# Patient Record
Sex: Male | Born: 1945 | Race: White | Hispanic: No | Marital: Married | Smoking: Former smoker
Health system: Southern US, Community
[De-identification: ages and names within clinical notes are randomized; demographics above are authoritative.]

## PROBLEM LIST (undated history)

## (undated) DIAGNOSIS — I219 Acute myocardial infarction, unspecified: Secondary | ICD-10-CM

## (undated) DIAGNOSIS — I272 Pulmonary hypertension, unspecified: Secondary | ICD-10-CM

## (undated) DIAGNOSIS — I10 Essential (primary) hypertension: Secondary | ICD-10-CM

## (undated) DIAGNOSIS — M1990 Unspecified osteoarthritis, unspecified site: Secondary | ICD-10-CM

## (undated) DIAGNOSIS — R251 Tremor, unspecified: Secondary | ICD-10-CM

## (undated) DIAGNOSIS — I480 Paroxysmal atrial fibrillation: Secondary | ICD-10-CM

## (undated) DIAGNOSIS — D75839 Thrombocytosis, unspecified: Secondary | ICD-10-CM

## (undated) DIAGNOSIS — E785 Hyperlipidemia, unspecified: Secondary | ICD-10-CM

## (undated) DIAGNOSIS — I071 Rheumatic tricuspid insufficiency: Secondary | ICD-10-CM

## (undated) DIAGNOSIS — I519 Heart disease, unspecified: Secondary | ICD-10-CM

## (undated) DIAGNOSIS — D509 Iron deficiency anemia, unspecified: Secondary | ICD-10-CM

## (undated) DIAGNOSIS — I340 Nonrheumatic mitral (valve) insufficiency: Secondary | ICD-10-CM

## (undated) DIAGNOSIS — I2510 Atherosclerotic heart disease of native coronary artery without angina pectoris: Secondary | ICD-10-CM

## (undated) DIAGNOSIS — K219 Gastro-esophageal reflux disease without esophagitis: Secondary | ICD-10-CM

## (undated) DIAGNOSIS — D473 Essential (hemorrhagic) thrombocythemia: Secondary | ICD-10-CM

## (undated) DIAGNOSIS — I509 Heart failure, unspecified: Secondary | ICD-10-CM

## (undated) DIAGNOSIS — E119 Type 2 diabetes mellitus without complications: Secondary | ICD-10-CM

## (undated) HISTORY — DX: Nonrheumatic mitral (valve) insufficiency: I34.0

## (undated) HISTORY — PX: CATARACT EXTRACTION: SUR2

## (undated) HISTORY — DX: Tremor, unspecified: R25.1

## (undated) HISTORY — DX: Pulmonary hypertension, unspecified: I27.20

## (undated) HISTORY — DX: Rheumatic tricuspid insufficiency: I07.1

## (undated) HISTORY — DX: Essential (hemorrhagic) thrombocythemia: D47.3

## (undated) HISTORY — DX: Thrombocytosis, unspecified: D75.839

## (undated) HISTORY — PX: KNEE SURGERY: SHX244

## (undated) HISTORY — DX: Atherosclerotic heart disease of native coronary artery without angina pectoris: I25.10

## (undated) HISTORY — DX: Type 2 diabetes mellitus without complications: E11.9

## (undated) HISTORY — DX: Paroxysmal atrial fibrillation: I48.0

## (undated) HISTORY — PX: HERNIA REPAIR: SHX51

## (undated) HISTORY — DX: Gastro-esophageal reflux disease without esophagitis: K21.9

## (undated) HISTORY — PX: HEMORRHOIDECTOMY WITH HEMORRHOID BANDING: SHX5633

## (undated) HISTORY — DX: Hyperlipidemia, unspecified: E78.5

## (undated) HISTORY — DX: Unspecified osteoarthritis, unspecified site: M19.90

## (undated) HISTORY — DX: Heart disease, unspecified: I51.9

## (undated) HISTORY — DX: Iron deficiency anemia, unspecified: D50.9

## (undated) HISTORY — DX: Acute myocardial infarction, unspecified: I21.9

## (undated) HISTORY — DX: Heart failure, unspecified: I50.9

---

## 1964-10-28 HISTORY — PX: OTHER SURGICAL HISTORY: SHX169

## 1987-10-29 DIAGNOSIS — I219 Acute myocardial infarction, unspecified: Secondary | ICD-10-CM

## 1987-10-29 HISTORY — DX: Acute myocardial infarction, unspecified: I21.9

## 1987-10-29 HISTORY — PX: CARDIAC CATHETERIZATION: SHX172

## 2006-09-30 ENCOUNTER — Ambulatory Visit: Payer: Self-pay | Admitting: Internal Medicine

## 2007-01-06 ENCOUNTER — Emergency Department: Payer: Self-pay | Admitting: Emergency Medicine

## 2009-04-27 ENCOUNTER — Ambulatory Visit: Payer: Self-pay | Admitting: Internal Medicine

## 2009-04-27 ENCOUNTER — Ambulatory Visit: Payer: Self-pay | Admitting: Ophthalmology

## 2009-05-03 ENCOUNTER — Ambulatory Visit: Payer: Self-pay | Admitting: Ophthalmology

## 2011-10-01 ENCOUNTER — Ambulatory Visit: Payer: Self-pay | Admitting: Surgery

## 2011-10-08 ENCOUNTER — Ambulatory Visit: Payer: Self-pay | Admitting: Surgery

## 2011-10-09 LAB — PATHOLOGY REPORT

## 2014-04-15 DIAGNOSIS — M1990 Unspecified osteoarthritis, unspecified site: Secondary | ICD-10-CM | POA: Insufficient documentation

## 2014-04-15 DIAGNOSIS — D649 Anemia, unspecified: Secondary | ICD-10-CM | POA: Insufficient documentation

## 2014-04-15 DIAGNOSIS — K219 Gastro-esophageal reflux disease without esophagitis: Secondary | ICD-10-CM | POA: Insufficient documentation

## 2014-04-15 DIAGNOSIS — Z955 Presence of coronary angioplasty implant and graft: Secondary | ICD-10-CM | POA: Insufficient documentation

## 2014-04-15 DIAGNOSIS — E785 Hyperlipidemia, unspecified: Secondary | ICD-10-CM | POA: Insufficient documentation

## 2014-04-15 DIAGNOSIS — I7090 Unspecified atherosclerosis: Secondary | ICD-10-CM | POA: Insufficient documentation

## 2014-04-15 DIAGNOSIS — E119 Type 2 diabetes mellitus without complications: Secondary | ICD-10-CM | POA: Insufficient documentation

## 2014-07-27 ENCOUNTER — Ambulatory Visit: Payer: Self-pay | Admitting: Internal Medicine

## 2014-07-27 LAB — CBC CANCER CENTER
Basophil #: 0 x10 3/mm (ref 0.0–0.1)
Basophil %: 0.7 %
Comment - H1-Com2: NORMAL
EOS ABS: 0.1 x10 3/mm (ref 0.0–0.7)
EOS PCT: 1.6 %
HCT: 30.3 % — ABNORMAL LOW (ref 40.0–52.0)
HGB: 9.2 g/dL — ABNORMAL LOW (ref 13.0–18.0)
LYMPHS PCT: 16.3 %
Lymphocyte #: 1.1 x10 3/mm (ref 1.0–3.6)
Lymphocytes: 26 %
MCH: 24.2 pg — AB (ref 26.0–34.0)
MCHC: 30.2 g/dL — ABNORMAL LOW (ref 32.0–36.0)
MCV: 80 fL (ref 80–100)
MONOS PCT: 10.9 %
MONOS PCT: 8 %
Monocyte #: 0.7 x10 3/mm (ref 0.2–1.0)
Neutrophil #: 4.8 x10 3/mm (ref 1.4–6.5)
Neutrophil %: 70.5 %
Platelet: 150 x10 3/mm (ref 150–440)
RBC: 3.78 10*6/uL — AB (ref 4.40–5.90)
RDW: 19.6 % — AB (ref 11.5–14.5)
SEGMENTED NEUTROPHILS: 66 %
WBC: 6.8 x10 3/mm (ref 3.8–10.6)

## 2014-07-27 LAB — IRON AND TIBC
IRON SATURATION: 5 %
Iron Bind.Cap.(Total): 527 ug/dL — ABNORMAL HIGH (ref 250–450)
Iron: 26 ug/dL — ABNORMAL LOW (ref 65–175)
Unbound Iron-Bind.Cap.: 501 ug/dL

## 2014-07-27 LAB — FERRITIN: Ferritin (ARMC): 21 ng/mL (ref 8–388)

## 2014-07-27 LAB — SEDIMENTATION RATE: ERYTHROCYTE SED RATE: 56 mm/h — AB (ref 0–20)

## 2014-07-27 LAB — FOLATE: Folic Acid: 17.2 ng/mL (ref 3.1–100.0)

## 2014-07-27 LAB — LACTATE DEHYDROGENASE: LDH: 197 U/L (ref 85–241)

## 2014-07-27 LAB — RETICULOCYTES
ABSOLUTE RETIC COUNT: 0.088 10*6/uL (ref 0.019–0.186)
RETICULOCYTE: 2.3 % (ref 0.4–3.1)

## 2014-07-28 ENCOUNTER — Ambulatory Visit: Payer: Self-pay | Admitting: Internal Medicine

## 2014-07-29 LAB — URINE IEP, RANDOM

## 2014-08-01 LAB — PROT IMMUNOELECTROPHORES(ARMC)

## 2014-08-02 LAB — OCCULT BLOOD X 1 CARD TO LAB, STOOL
Occult Blood, Feces: POSITIVE
Occult Blood, Feces: POSITIVE

## 2014-08-28 ENCOUNTER — Ambulatory Visit: Payer: Self-pay | Admitting: Internal Medicine

## 2014-09-08 DIAGNOSIS — Z789 Other specified health status: Secondary | ICD-10-CM | POA: Insufficient documentation

## 2014-09-08 DIAGNOSIS — R195 Other fecal abnormalities: Secondary | ICD-10-CM | POA: Insufficient documentation

## 2014-09-08 DIAGNOSIS — Z7289 Other problems related to lifestyle: Secondary | ICD-10-CM | POA: Insufficient documentation

## 2014-10-13 ENCOUNTER — Ambulatory Visit: Payer: Self-pay | Admitting: Unknown Physician Specialty

## 2014-10-13 HISTORY — PX: COLONOSCOPY: SHX174

## 2014-11-02 ENCOUNTER — Ambulatory Visit: Payer: Self-pay | Admitting: Internal Medicine

## 2014-11-02 LAB — CBC CANCER CENTER
Basophil #: 0.1 x10 3/mm (ref 0.0–0.1)
Basophil %: 0.8 %
EOS ABS: 0.1 x10 3/mm (ref 0.0–0.7)
Eosinophil %: 2.1 %
HCT: 34.2 % — AB (ref 40.0–52.0)
HGB: 11.2 g/dL — AB (ref 13.0–18.0)
LYMPHS ABS: 1 x10 3/mm (ref 1.0–3.6)
Lymphocyte %: 14.3 %
MCH: 29.3 pg (ref 26.0–34.0)
MCHC: 32.6 g/dL (ref 32.0–36.0)
MCV: 90 fL (ref 80–100)
MONO ABS: 0.8 x10 3/mm (ref 0.2–1.0)
Monocyte %: 11.5 %
NEUTROS ABS: 4.8 x10 3/mm (ref 1.4–6.5)
Neutrophil %: 71.3 %
Platelet: 132 x10 3/mm — ABNORMAL LOW (ref 150–440)
RBC: 3.8 10*6/uL — ABNORMAL LOW (ref 4.40–5.90)
RDW: 15.2 % — ABNORMAL HIGH (ref 11.5–14.5)
WBC: 6.8 x10 3/mm (ref 3.8–10.6)

## 2014-11-02 LAB — IRON AND TIBC
IRON: 43 ug/dL — AB (ref 65–175)
Iron Bind.Cap.(Total): 469 ug/dL — ABNORMAL HIGH (ref 250–450)
Iron Saturation: 9 %
Unbound Iron-Bind.Cap.: 426 ug/dL

## 2014-11-02 LAB — FERRITIN: FERRITIN (ARMC): 39 ng/mL (ref 8–388)

## 2014-11-28 ENCOUNTER — Ambulatory Visit: Payer: Self-pay | Admitting: Internal Medicine

## 2015-01-25 ENCOUNTER — Ambulatory Visit
Admit: 2015-01-25 | Disposition: A | Discharge status: Home / Self Care | Payer: Self-pay | Attending: Internal Medicine | Admitting: Internal Medicine

## 2015-01-25 LAB — CREATININE, SERUM: Creatinine: 0.86 mg/dL

## 2015-01-25 LAB — CBC CANCER CENTER
Basophil #: 0 x10 3/mm (ref 0.0–0.1)
Basophil %: 1 %
Eosinophil #: 0.1 x10 3/mm (ref 0.0–0.7)
Eosinophil %: 3.2 %
HCT: 33.6 % — AB (ref 40.0–52.0)
HGB: 11.5 g/dL — AB (ref 13.0–18.0)
LYMPHS PCT: 22 %
Lymphocyte #: 1 x10 3/mm (ref 1.0–3.6)
MCH: 33.2 pg (ref 26.0–34.0)
MCHC: 34.2 g/dL (ref 32.0–36.0)
MCV: 97 fL (ref 80–100)
MONOS PCT: 8.6 %
Monocyte #: 0.4 x10 3/mm (ref 0.2–1.0)
Neutrophil #: 2.8 x10 3/mm (ref 1.4–6.5)
Neutrophil %: 65.2 %
Platelet: 111 x10 3/mm — ABNORMAL LOW (ref 150–440)
RBC: 3.45 10*6/uL — ABNORMAL LOW (ref 4.40–5.90)
RDW: 13.6 % (ref 11.5–14.5)
WBC: 4.4 x10 3/mm (ref 3.8–10.6)

## 2015-01-25 LAB — IRON AND TIBC
Iron Bind.Cap.(Total): 414 (ref 250–450)
Iron Saturation: 11.1
Iron: 46 ug/dL
Unbound Iron-Bind.Cap.: 367.6

## 2015-01-25 LAB — FERRITIN: FERRITIN (ARMC): 78 ng/mL

## 2015-01-27 ENCOUNTER — Ambulatory Visit
Admit: 2015-01-27 | Disposition: A | Discharge status: Home / Self Care | Payer: Self-pay | Attending: Internal Medicine | Admitting: Internal Medicine

## 2015-02-20 LAB — SURGICAL PATHOLOGY

## 2015-04-18 ENCOUNTER — Other Ambulatory Visit: Payer: Self-pay

## 2015-04-18 DIAGNOSIS — D509 Iron deficiency anemia, unspecified: Secondary | ICD-10-CM

## 2015-04-19 ENCOUNTER — Inpatient Hospital Stay: Payer: Medicare Other

## 2015-04-19 ENCOUNTER — Inpatient Hospital Stay: Payer: Medicare Other | Attending: Internal Medicine | Admitting: Internal Medicine

## 2015-04-19 ENCOUNTER — Encounter: Payer: Self-pay | Admitting: Internal Medicine

## 2015-04-19 VITALS — BP 156/80 | HR 72 | Temp 97.1°F | Resp 18 | Ht 69.0 in | Wt 236.1 lb

## 2015-04-19 DIAGNOSIS — F17210 Nicotine dependence, cigarettes, uncomplicated: Secondary | ICD-10-CM | POA: Insufficient documentation

## 2015-04-19 DIAGNOSIS — Z7982 Long term (current) use of aspirin: Secondary | ICD-10-CM | POA: Diagnosis not present

## 2015-04-19 DIAGNOSIS — D509 Iron deficiency anemia, unspecified: Secondary | ICD-10-CM | POA: Insufficient documentation

## 2015-04-19 DIAGNOSIS — I2510 Atherosclerotic heart disease of native coronary artery without angina pectoris: Secondary | ICD-10-CM | POA: Diagnosis not present

## 2015-04-19 DIAGNOSIS — Z79899 Other long term (current) drug therapy: Secondary | ICD-10-CM | POA: Diagnosis not present

## 2015-04-19 DIAGNOSIS — E119 Type 2 diabetes mellitus without complications: Secondary | ICD-10-CM | POA: Insufficient documentation

## 2015-04-19 DIAGNOSIS — Q2730 Arteriovenous malformation, site unspecified: Secondary | ICD-10-CM

## 2015-04-19 DIAGNOSIS — R5383 Other fatigue: Secondary | ICD-10-CM | POA: Diagnosis not present

## 2015-04-19 DIAGNOSIS — D696 Thrombocytopenia, unspecified: Secondary | ICD-10-CM | POA: Diagnosis not present

## 2015-04-19 LAB — CBC WITH DIFFERENTIAL/PLATELET
Basophils Absolute: 0 10*3/uL (ref 0–0.1)
Basophils Relative: 1 %
Eosinophils Absolute: 0.1 10*3/uL (ref 0–0.7)
Eosinophils Relative: 1 %
HCT: 38.2 % — ABNORMAL LOW (ref 40.0–52.0)
HEMOGLOBIN: 12.6 g/dL — AB (ref 13.0–18.0)
Lymphocytes Relative: 13 %
Lymphs Abs: 0.7 10*3/uL — ABNORMAL LOW (ref 1.0–3.6)
MCH: 31.4 pg (ref 26.0–34.0)
MCHC: 32.9 g/dL (ref 32.0–36.0)
MCV: 95.6 fL (ref 80.0–100.0)
MONO ABS: 0.7 10*3/uL (ref 0.2–1.0)
Monocytes Relative: 11 %
Neutro Abs: 4.3 10*3/uL (ref 1.4–6.5)
Neutrophils Relative %: 74 %
Platelets: 103 10*3/uL — ABNORMAL LOW (ref 150–440)
RBC: 4 MIL/uL — ABNORMAL LOW (ref 4.40–5.90)
RDW: 13.9 % (ref 11.5–14.5)
WBC: 5.7 10*3/uL (ref 3.8–10.6)

## 2015-04-19 LAB — IRON AND TIBC
Iron: 118 ug/dL (ref 45–182)
SATURATION RATIOS: 23 % (ref 17.9–39.5)
TIBC: 505 ug/dL — AB (ref 250–450)
UIBC: 387 ug/dL

## 2015-04-19 LAB — FERRITIN: Ferritin: 53 ng/mL (ref 24–336)

## 2015-04-30 NOTE — Progress Notes (Signed)
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 Patient intolerant of oral iron. On parenteral iron therapy. Received total 500 mg IV Venofer on 08/16/2014.   Anemia workup on 07/27/2014 showed hemoglobin 9.2, platelets 150, absolute retic 0.088, ESR 56, ferritin 21, serum iron low at 26, TIBC elevated at 527, iron saturation 5%. Otherwise, folate, LDH, direct Coombs test, FANA, SIEP (polyclonal), random UIEP, B12, haptoglobin, serum EPO (201.2), all unremarkable. Stool Hemoccult was positive x2.  December 17/2015 - EGD showed 2 nonbleeding ectasias in stomach, treated. Erythematous antrum. Colonoscopy showed multiple recently bleeding ectasias. Internal hemorrhoids. Transverse colon mucus biopsy negative.    HISTORY OF PRESENT ILLNESS:  Patient returns for continued hematology followup, he was seen 6 months ago. He had 1 dose of IV Venofer 500 mg in October. He has been taking low-dose ferrous sulfate 45 mg extended release 1 tablet daily, states that he does not have much side effects from this. Hemoglobin today is better at 12.6. He has intermittent dark stools otherwise denies any obvious bright red blood in stools,  or hematuria. Overall energy level is better since getting IV iron last time but still gets fatigue on exertion. No angina, palpitation, orthopnea, or PND. No new bone pains. Platelet count today is 103K, denies other bleeding issues.     REVIEW OF SYSTEMS:   ROS As in HPI above. In addition, no fever, chills or sweats. No new headaches or focal weakness.  No new sore throat, cough, shortness of breath, sputum, hemoptysis or chest pain. No dizziness or palpitation. No abdominal pain, constipation, diarrhea,  dysuria or hematuria. No new skin rash or bleeding symptoms. No new paresthesias in extremities.    PAST MEDICAL HISTORY: Reviewed. Past Medical History  Diagnosis Date  . Diabetes mellitus without complication           Diabetes mellitus  Coronary artery disease status post stent placement  Hemorrhoidectomy  Hernia repair  PAST SURGICAL HISTORY: Reviewed. As above  FAMILY HISTORY: Reviewed. Denies malignancy or hematological disorders.  ADVANCED DIRECTIVES:  <no information>  SOCIAL HISTORY: Reviewed. History  Substance Use Topics  . Smoking status: Former Smoker -- 0.50 packs/day for 40 years    Types: Cigarettes    Quit date: 04/18/1997  . Smokeless tobacco: Never Used  . Alcohol Use: 8.4 oz/week    14 Shots of liquor per week  Denies smoking or alcohol usage.   Current Outpatient Prescriptions  Medication Sig Dispense Refill  . aspirin 81 MG tablet Take 81 mg by mouth daily.    Marland Kitchen atenolol (TENORMIN) 25 MG tablet Take 25 mg by mouth daily.    . celecoxib (CELEBREX) 200 MG capsule Take 200 mg by mouth daily.    . hydrochlorothiazide (HYDRODIURIL) 25 MG tablet Take 25 mg by mouth daily.    Marland Kitchen HYDROcodone-acetaminophen (NORCO) 10-325 MG per tablet   0  . lansoprazole (PREVACID) 15 MG capsule Take 15 mg by mouth daily at 12 noon.    . quiNINE (QUALAQUIN) 324 MG capsule Take 324 mg by mouth daily as needed.    . simvastatin (ZOCOR) 40 MG tablet Take 40 mg by mouth daily.     No current facility-administered medications for this visit.    PHYSICAL EXAM: Filed  Vitals:   04/19/15 0848  BP: 156/80  Pulse: 72  Temp: 97.1 F (36.2 C)  Resp: 18     Body mass index is 34.85 kg/(m^2).       GENERAL: Patient is alert and oriented and in no acute distress. There is no icterus or pallor. HEENT: EOMs intact. No cervical lymphadenopathy. CVS: S1S2, regular LUNGS: Bilaterally clear to auscultation, no rhonchi. ABDOMEN: Soft, nontender. No hepatosplenomegaly clinically.    EXTREMITIES: No pedal edema.   LAB RESULTS: Serum iron 118, ferritin 53, iron sat 23%, TIBC 505, Hb 12.6, WBC 5.7, plts 103.     Component Value Date/Time   CREATININE 0.86 01/25/2015 0856   GFRNONAA >60 01/25/2015 0856   GFRAA >60 01/25/2015 0856   Lab Results  Component Value Date   WBC 5.7 04/19/2015   NEUTROABS 4.3 04/19/2015   HGB 12.6* 04/19/2015   HCT 38.2* 04/19/2015   MCV 95.6 04/19/2015   PLT 103* 04/19/2015     STUDIES: 07/18/14 - Hb 8.8, MCV 82.3, WBC 6800 with unremarkable differential, platelets 151, Cr 0.9, calcium 8.9, LFT unremarkable except AST of 50, albumin 3.8. June 2015 - Hb was 9.3, WBC 6600, platelets 181. Oct 2015 - stool OB positive.  ASSESSMENT / PLAN:   1. 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 Patient intolerant of oral iron. On parenteral iron therapy  -  Reviewed labs from today and d/w patient and family present. Hb is better at 12.6, iron study has normalized except elevated TIBC of 505. He has intermittent dark stools and given AVMs and low platelets have d/w Dr.Sparks who is okay with stopping aspirin to decrease risk of bleeding. Have recommended pursuing bone marrow biopsy but he first wants to stop alcohol and see if count improves, otherwise will be agreeable to bone marrow biopsy. Will monitor closely, get CBC q 3 weekly. Next MD f/u at 24 weeks with labs and make continued treatment planning.  2. Bleeding ectasias in colon on colonoscopy Dec 2015 - plan is as above.  3. Thrombocytopenia  - New-onset but mild and no obvious bleeding symptoms. Platelet count in September was low normal range. ?ITP versus alcohol versus other etiology. Plan is as above.  4. In between visits, patient advised to call or come to ER in case of any progressive anemia symptoms or acute sickness. He is agreeable to this plan.    Leia Alf, MD   04/30/2015 8:47 PM

## 2015-05-10 ENCOUNTER — Inpatient Hospital Stay: Payer: Medicare Other | Attending: Internal Medicine

## 2015-05-10 DIAGNOSIS — D696 Thrombocytopenia, unspecified: Secondary | ICD-10-CM | POA: Insufficient documentation

## 2015-05-10 DIAGNOSIS — D509 Iron deficiency anemia, unspecified: Secondary | ICD-10-CM

## 2015-05-10 LAB — CBC WITH DIFFERENTIAL/PLATELET
BASOS ABS: 0.1 10*3/uL (ref 0–0.1)
Basophils Relative: 1 %
EOS ABS: 0.1 10*3/uL (ref 0–0.7)
Eosinophils Relative: 3 %
HCT: 39.3 % — ABNORMAL LOW (ref 40.0–52.0)
Hemoglobin: 13 g/dL (ref 13.0–18.0)
LYMPHS ABS: 1.3 10*3/uL (ref 1.0–3.6)
Lymphocytes Relative: 28 %
MCH: 32 pg (ref 26.0–34.0)
MCHC: 33 g/dL (ref 32.0–36.0)
MCV: 97 fL (ref 80.0–100.0)
MONO ABS: 0.5 10*3/uL (ref 0.2–1.0)
MONOS PCT: 11 %
NEUTROS PCT: 57 %
Neutro Abs: 2.8 10*3/uL (ref 1.4–6.5)
Platelets: 140 10*3/uL — ABNORMAL LOW (ref 150–440)
RBC: 4.05 MIL/uL — ABNORMAL LOW (ref 4.40–5.90)
RDW: 15.2 % — AB (ref 11.5–14.5)
WBC: 4.9 10*3/uL (ref 3.8–10.6)

## 2015-05-10 LAB — RETICULOCYTES
RBC.: 4.05 MIL/uL — ABNORMAL LOW (ref 4.40–5.90)
Retic Count, Absolute: 85.1 10*3/uL (ref 19.0–183.0)
Retic Ct Pct: 2.1 % (ref 0.4–3.1)

## 2015-05-10 LAB — VITAMIN B12: Vitamin B-12: 563 pg/mL (ref 180–914)

## 2015-05-12 LAB — FOLATE RBC
FOLATE, HEMOLYSATE: 388 ng/mL
Folate, RBC: 990 ng/mL (ref >498)
Hematocrit: 39.2 % (ref 37.5–51.0)

## 2015-05-31 ENCOUNTER — Inpatient Hospital Stay: Payer: Medicare Other | Attending: Internal Medicine

## 2015-05-31 DIAGNOSIS — D509 Iron deficiency anemia, unspecified: Secondary | ICD-10-CM

## 2015-05-31 DIAGNOSIS — D696 Thrombocytopenia, unspecified: Secondary | ICD-10-CM | POA: Diagnosis present

## 2015-05-31 LAB — CBC WITH DIFFERENTIAL/PLATELET
BASOS ABS: 0.1 10*3/uL (ref 0–0.1)
BASOS PCT: 1 %
EOS ABS: 0.1 10*3/uL (ref 0–0.7)
EOS PCT: 2 %
HEMATOCRIT: 39.3 % — AB (ref 40.0–52.0)
Hemoglobin: 13.3 g/dL (ref 13.0–18.0)
Lymphocytes Relative: 18 %
Lymphs Abs: 1.2 10*3/uL (ref 1.0–3.6)
MCH: 32.4 pg (ref 26.0–34.0)
MCHC: 33.8 g/dL (ref 32.0–36.0)
MCV: 96.1 fL (ref 80.0–100.0)
Monocytes Absolute: 0.8 10*3/uL (ref 0.2–1.0)
Monocytes Relative: 11 %
NEUTROS PCT: 68 %
Neutro Abs: 4.5 10*3/uL (ref 1.4–6.5)
Platelets: 112 10*3/uL — ABNORMAL LOW (ref 150–440)
RBC: 4.09 MIL/uL — AB (ref 4.40–5.90)
RDW: 16 % — ABNORMAL HIGH (ref 11.5–14.5)
WBC: 6.7 10*3/uL (ref 3.8–10.6)

## 2015-06-05 ENCOUNTER — Telehealth: Payer: Self-pay | Admitting: *Deleted

## 2015-06-05 NOTE — Telephone Encounter (Signed)
Pt called for results and let him know that hgb normal and plt 112 which is less than it was last time.  Asked pt if he is off the aspirin and he said that dr pandit was going to talk with [facility] and they would call him but he never got the call.  Per pandit note it states he discussed with [facility] over the phone and he was ok to stop asa.  Pt will stop taking today and when he comes 8/24 we can see if there is a difference. Pt agreeable to plan

## 2015-06-21 ENCOUNTER — Inpatient Hospital Stay: Payer: Medicare Other

## 2015-07-12 ENCOUNTER — Inpatient Hospital Stay: Payer: Medicare Other | Attending: Internal Medicine

## 2015-07-21 ENCOUNTER — Other Ambulatory Visit: Payer: Self-pay | Admitting: Internal Medicine

## 2015-07-21 DIAGNOSIS — R270 Ataxia, unspecified: Secondary | ICD-10-CM

## 2015-07-26 ENCOUNTER — Ambulatory Visit
Admission: RE | Admit: 2015-07-26 | Discharge: 2015-07-26 | Disposition: A | Discharge status: Home / Self Care | Payer: Medicare Other | Source: Ambulatory Visit | Attending: Internal Medicine | Admitting: Internal Medicine

## 2015-07-26 DIAGNOSIS — R270 Ataxia, unspecified: Secondary | ICD-10-CM | POA: Diagnosis present

## 2015-07-26 DIAGNOSIS — I2510 Atherosclerotic heart disease of native coronary artery without angina pectoris: Secondary | ICD-10-CM | POA: Diagnosis not present

## 2015-08-02 ENCOUNTER — Inpatient Hospital Stay: Payer: Medicare Other | Attending: Internal Medicine

## 2015-08-23 ENCOUNTER — Inpatient Hospital Stay: Payer: Medicare Other

## 2015-09-13 ENCOUNTER — Inpatient Hospital Stay: Payer: Medicare Other | Attending: Internal Medicine

## 2015-10-02 ENCOUNTER — Other Ambulatory Visit: Payer: Self-pay | Admitting: *Deleted

## 2015-10-02 DIAGNOSIS — D509 Iron deficiency anemia, unspecified: Secondary | ICD-10-CM

## 2015-10-02 DIAGNOSIS — D696 Thrombocytopenia, unspecified: Secondary | ICD-10-CM

## 2015-10-04 ENCOUNTER — Inpatient Hospital Stay: Payer: Medicare Other | Attending: Internal Medicine

## 2015-10-04 ENCOUNTER — Inpatient Hospital Stay (HOSPITAL_BASED_OUTPATIENT_CLINIC_OR_DEPARTMENT_OTHER): Payer: Medicare Other | Admitting: Internal Medicine

## 2015-10-04 ENCOUNTER — Encounter: Payer: Self-pay | Admitting: Internal Medicine

## 2015-10-04 VITALS — BP 155/77 | HR 70 | Temp 98.8°F | Resp 18 | Ht 69.0 in | Wt 229.3 lb

## 2015-10-04 DIAGNOSIS — D7589 Other specified diseases of blood and blood-forming organs: Secondary | ICD-10-CM | POA: Insufficient documentation

## 2015-10-04 DIAGNOSIS — Z87891 Personal history of nicotine dependence: Secondary | ICD-10-CM | POA: Insufficient documentation

## 2015-10-04 DIAGNOSIS — Z79899 Other long term (current) drug therapy: Secondary | ICD-10-CM | POA: Insufficient documentation

## 2015-10-04 DIAGNOSIS — I2510 Atherosclerotic heart disease of native coronary artery without angina pectoris: Secondary | ICD-10-CM | POA: Insufficient documentation

## 2015-10-04 DIAGNOSIS — E119 Type 2 diabetes mellitus without complications: Secondary | ICD-10-CM

## 2015-10-04 DIAGNOSIS — R251 Tremor, unspecified: Secondary | ICD-10-CM

## 2015-10-04 DIAGNOSIS — Z993 Dependence on wheelchair: Secondary | ICD-10-CM | POA: Insufficient documentation

## 2015-10-04 DIAGNOSIS — I272 Other secondary pulmonary hypertension: Secondary | ICD-10-CM | POA: Insufficient documentation

## 2015-10-04 DIAGNOSIS — E785 Hyperlipidemia, unspecified: Secondary | ICD-10-CM | POA: Diagnosis not present

## 2015-10-04 DIAGNOSIS — Z8719 Personal history of other diseases of the digestive system: Secondary | ICD-10-CM | POA: Diagnosis not present

## 2015-10-04 DIAGNOSIS — D509 Iron deficiency anemia, unspecified: Secondary | ICD-10-CM

## 2015-10-04 DIAGNOSIS — F1010 Alcohol abuse, uncomplicated: Secondary | ICD-10-CM | POA: Diagnosis not present

## 2015-10-04 DIAGNOSIS — Z9181 History of falling: Secondary | ICD-10-CM | POA: Diagnosis not present

## 2015-10-04 DIAGNOSIS — I081 Rheumatic disorders of both mitral and tricuspid valves: Secondary | ICD-10-CM

## 2015-10-04 DIAGNOSIS — I252 Old myocardial infarction: Secondary | ICD-10-CM | POA: Diagnosis not present

## 2015-10-04 DIAGNOSIS — D696 Thrombocytopenia, unspecified: Secondary | ICD-10-CM | POA: Diagnosis not present

## 2015-10-04 DIAGNOSIS — K219 Gastro-esophageal reflux disease without esophagitis: Secondary | ICD-10-CM

## 2015-10-04 DIAGNOSIS — D473 Essential (hemorrhagic) thrombocythemia: Secondary | ICD-10-CM

## 2015-10-04 LAB — CBC WITH DIFFERENTIAL/PLATELET
BASOS PCT: 1 %
Basophils Absolute: 0 10*3/uL (ref 0–0.1)
Eosinophils Absolute: 0 10*3/uL (ref 0–0.7)
Eosinophils Relative: 1 %
HEMATOCRIT: 43.9 % (ref 40.0–52.0)
HEMOGLOBIN: 15.3 g/dL (ref 13.0–18.0)
Lymphocytes Relative: 11 %
Lymphs Abs: 0.6 10*3/uL — ABNORMAL LOW (ref 1.0–3.6)
MCH: 35.9 pg — ABNORMAL HIGH (ref 26.0–34.0)
MCHC: 34.7 g/dL (ref 32.0–36.0)
MCV: 103.5 fL — ABNORMAL HIGH (ref 80.0–100.0)
Monocytes Absolute: 0.7 10*3/uL (ref 0.2–1.0)
Monocytes Relative: 13 %
NEUTROS PCT: 74 %
Neutro Abs: 4.1 10*3/uL (ref 1.4–6.5)
Platelets: 83 10*3/uL — ABNORMAL LOW (ref 150–440)
RBC: 4.25 MIL/uL — AB (ref 4.40–5.90)
RDW: 12.9 % (ref 11.5–14.5)
WBC: 5.5 10*3/uL (ref 3.8–10.6)

## 2015-10-04 LAB — IRON AND TIBC
IRON: 173 ug/dL (ref 45–182)
SATURATION RATIOS: 45 % — AB (ref 17.9–39.5)
TIBC: 385 ug/dL (ref 250–450)
UIBC: 212 ug/dL

## 2015-10-04 LAB — FERRITIN: FERRITIN: 128 ng/mL (ref 24–336)

## 2015-10-04 NOTE — Progress Notes (Signed)
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 alcohol  INTERVAL HISTORY:  This is my first interaction with the patient since I joined the practice September 2016. I reviewed the patient's prior charts/pertinent labs/imaging in detail; findings are summarized above.   A pleasant 69 year old Caucasian male patient with above history of iron deficiency anemia from previous GI bleed; is here for follow-up.  Patient states that he has had difficulty with balance for the last many months. He has been falling; and is currently in a wheelchair.   In general his appetite is good. He is not losing any significant weight. Denies any chest pain or shortness of breath or cough. He admits to drinking alcohol every night; for the last 6 months or so. He denies any blood in stools black stools.   REVIEW OF SYSTEMS:  A complete 10 point review of system is done which is negative except mentioned above/history of present illness.   PAST MEDICAL HISTORY :  Past Medical History  Diagnosis Date  . Diabetes mellitus without complication ([place])   . Thrombocytosis ([place])   . IDA (iron deficiency anemia)   . Heart disease   . Heart attack ([place]) 1989  . Tremor   . Chronic pulmonary hypertension ([place])   . Moderate tricuspid insufficiency   . Moderate mitral insufficiency   . Paroxysmal a-fib ([place])   . OA (osteoarthritis)   . ASCVD (arteriosclerotic cardiovascular disease)   . Hyperlipidemia   . GERD (gastroesophageal reflux disease)     PAST SURGICAL HISTORY :   Past Surgical History  Procedure Laterality Date  . Knee surgery      bilateral knee surgery  . Hernia repair      x 2  . Cataract extraction    . Cardiac  catheterization  1989    with stainless steel stent  . Colonoscopy  10/13/2014  . Closed reduction vertebral process fracture  1966  . Hemorrhoidectomy with hemorrhoid banding      FAMILY HISTORY :   Family History  Problem Relation Age of Onset  . Heart attack Father     SOCIAL HISTORY:   Social History  Substance Use Topics  . Smoking status: Former Smoker -- 0.50 packs/day for 40 years    Types: Cigarettes    Quit date: 04/18/1997  . Smokeless tobacco: Never Used  . Alcohol Use: 30.0 oz/week    50 Shots of liquor per week     Comment: "I mix a couple cups whisky/day"    ALLERGIES:  has no allergies on file.  MEDICATIONS:  Current Outpatient Prescriptions  Medication Sig Dispense Refill  . atenolol (TENORMIN) 25 MG tablet Take 25 mg by mouth 2 (two) times daily.     . celecoxib (CELEBREX) 200 MG capsule Take 200 mg by mouth daily. As needed    . hydrochlorothiazide (HYDRODIURIL) 25 MG tablet Take 25 mg by mouth daily.     Marland Kitchen HYDROcodone-acetaminophen (NORCO) 10-325 MG per tablet 2 tablets every 6 (six) hours as needed for moderate pain.   0  . lansoprazole (PREVACID) 15 MG capsule Take 15 mg by mouth daily at 12 noon.    . simvastatin (ZOCOR) 40 MG tablet Take 40 mg by  mouth daily.    . quiNINE (QUALAQUIN) 324 MG capsule Take 324 mg by mouth daily as needed.     No current facility-administered medications for this visit.    PHYSICAL EXAMINATION:  BP 155/77 mmHg  Pulse 70  Temp(Src) 98.8 F (37.1 C) (Tympanic)  Resp 18  Ht '5\' 9"'  (1.753 m)  Wt 229 lb 4.5 oz (104 kg)  BMI 33.84 kg/m2  Filed Weights   10/04/15 1041  Weight: 229 lb 4.5 oz (104 kg)    GENERAL: Well-nourished well-developed; Alert, no distress and comfortable.   He is in a wheelchair. Accompanied by his wife. EYES: no pallor or icterus OROPHARYNX: no thrush or ulceration; good dentition  NECK: supple, no masses felt LYMPH:  no palpable lymphadenopathy in the cervical, axillary or inguinal  regions LUNGS: clear to auscultation and  No wheeze or crackles HEART/CVS: regular rate & rhythm and no murmurs; No lower extremity edema ABDOMEN:abdomen soft, non-tender and normal bowel sounds Musculoskeletal:no cyanosis of digits and no clubbing  PSYCH: alert & oriented x 3 with fluent speech NEURO: no focal motor/sensory deficits; he has mild tremors in his hands.  SKIN:  no rashes or significant lesions  LABORATORY DATA:  I have reviewed the data as listed    Component Value Date/Time   CREATININE 0.86 01/25/2015 0856   GFRNONAA >60 01/25/2015 0856   GFRAA >60 01/25/2015 0856    No results found for: SPEP, UPEP  Lab Results  Component Value Date   WBC 5.5 10/04/2015   NEUTROABS 4.1 10/04/2015   HGB 15.3 10/04/2015   HCT 43.9 10/04/2015   MCV 103.5* 10/04/2015   PLT 83* 10/04/2015      Chemistry      Component Value Date/Time   CREATININE 0.86 01/25/2015 0856   No results found for: CALCIUM, ALKPHOS, AST, ALT, BILITOT     RADIOGRAPHIC STUDIES: I have personally reviewed the radiological images as listed and agreed with the findings in the report. No results found.   ASSESSMENT & PLAN:   # Iron deficiency anemia- likely from history of GI bleed/AVMs status post IV iron infusion in October 2015. Today hemoglobin is 15.3. No iron infusion needed.  # Thrombocytopenia- initially noted in January 2016 ~130s; however slowly has been getting worse- and today the platelet count is 83. Patient also has mild macrocytosis with MCV 103. Likely secondary to alcohol. Question related to MDS versus other causes. If thrombocytopenia continues to get worse- discussed the possible need for bone marrow biopsy.   # Alcohol abuse- Discussed the importance of alcohol cessation. He is not very keen on cessation.   No orders of the defined types were placed in this encounter.   All questions were answered. The patient knows to call the clinic with any problems, questions or concerns.  No barriers to learning was detected.  # 25 minutes face-to-face with the patient discussing the above plan of care; more than 50% of time spent on prognosis/ natural history; counseling and coordination.      Cammie Sickle, MD 10/04/2015 10:54 AM

## 2016-02-02 ENCOUNTER — Inpatient Hospital Stay: Payer: Medicare Other | Admitting: Internal Medicine

## 2016-02-02 ENCOUNTER — Inpatient Hospital Stay: Payer: Medicare Other | Attending: Internal Medicine

## 2018-01-21 ENCOUNTER — Telehealth: Payer: Self-pay | Admitting: Internal Medicine

## 2018-01-21 NOTE — Telephone Encounter (Signed)
Dx Pancytopenia. Ref by Dr Dellis Filbert D.Sparks.  Notes in Book. New/Updated patient pkt and appt ltr was mailed to patient.  Referring office/[facility] notified for 04/12 @ 2:30 pm, (time/date)  per Heather. ESTPT 15 mins/ok per Dr Rogue Bussing.

## 2018-02-06 ENCOUNTER — Inpatient Hospital Stay: Payer: Medicare Other | Attending: Internal Medicine | Admitting: Internal Medicine

## 2018-02-06 ENCOUNTER — Inpatient Hospital Stay: Payer: Medicare Other

## 2018-02-06 ENCOUNTER — Encounter: Payer: Self-pay | Admitting: Internal Medicine

## 2018-02-06 VITALS — BP 153/89 | HR 86 | Temp 97.6°F | Resp 20 | Ht 69.0 in | Wt 235.0 lb

## 2018-02-06 DIAGNOSIS — Z87891 Personal history of nicotine dependence: Secondary | ICD-10-CM | POA: Diagnosis not present

## 2018-02-06 DIAGNOSIS — D696 Thrombocytopenia, unspecified: Secondary | ICD-10-CM

## 2018-02-06 DIAGNOSIS — M25511 Pain in right shoulder: Secondary | ICD-10-CM | POA: Diagnosis not present

## 2018-02-06 DIAGNOSIS — D731 Hypersplenism: Secondary | ICD-10-CM | POA: Diagnosis not present

## 2018-02-06 DIAGNOSIS — F1010 Alcohol abuse, uncomplicated: Secondary | ICD-10-CM

## 2018-02-06 DIAGNOSIS — Z79899 Other long term (current) drug therapy: Secondary | ICD-10-CM | POA: Diagnosis not present

## 2018-02-06 DIAGNOSIS — R161 Splenomegaly, not elsewhere classified: Secondary | ICD-10-CM

## 2018-02-06 DIAGNOSIS — D509 Iron deficiency anemia, unspecified: Secondary | ICD-10-CM

## 2018-02-06 DIAGNOSIS — D7589 Other specified diseases of blood and blood-forming organs: Secondary | ICD-10-CM

## 2018-02-06 DIAGNOSIS — E119 Type 2 diabetes mellitus without complications: Secondary | ICD-10-CM

## 2018-02-06 LAB — COMPREHENSIVE METABOLIC PANEL
ALBUMIN: 3.3 g/dL — AB (ref 3.5–5.0)
ALK PHOS: 115 U/L (ref 38–126)
ALT: 51 U/L (ref 17–63)
ANION GAP: 17 — AB (ref 5–15)
AST: 283 U/L — ABNORMAL HIGH (ref 15–41)
BILIRUBIN TOTAL: 5.8 mg/dL — AB (ref 0.3–1.2)
BUN: 10 mg/dL (ref 6–20)
CALCIUM: 8.7 mg/dL — AB (ref 8.9–10.3)
CO2: 26 mmol/L (ref 22–32)
Chloride: 92 mmol/L — ABNORMAL LOW (ref 101–111)
Creatinine, Ser: 0.85 mg/dL (ref 0.61–1.24)
GFR calc Af Amer: >60 mL/min (ref >60)
GLUCOSE: 109 mg/dL — AB (ref 65–99)
Potassium: 3 mmol/L — ABNORMAL LOW (ref 3.5–5.1)
Sodium: 135 mmol/L (ref 135–145)
TOTAL PROTEIN: 7.9 g/dL (ref 6.5–8.1)

## 2018-02-06 LAB — CBC WITH DIFFERENTIAL/PLATELET
BASOS PCT: 1 %
Basophils Absolute: 0 10*3/uL (ref 0–0.1)
EOS ABS: 0.1 10*3/uL (ref 0–0.7)
Eosinophils Relative: 2 %
HCT: 39 % — ABNORMAL LOW (ref 40.0–52.0)
HEMOGLOBIN: 13.7 g/dL (ref 13.0–18.0)
Lymphocytes Relative: 17 %
Lymphs Abs: 0.7 10*3/uL — ABNORMAL LOW (ref 1.0–3.6)
MCH: 37.9 pg — ABNORMAL HIGH (ref 26.0–34.0)
MCHC: 35.2 g/dL (ref 32.0–36.0)
MCV: 107.7 fL — ABNORMAL HIGH (ref 80.0–100.0)
MONOS PCT: 14 %
Monocytes Absolute: 0.6 10*3/uL (ref 0.2–1.0)
NEUTROS PCT: 66 %
Neutro Abs: 2.7 10*3/uL (ref 1.4–6.5)
Platelets: 40 10*3/uL — ABNORMAL LOW (ref 150–440)
RBC: 3.63 MIL/uL — ABNORMAL LOW (ref 4.40–5.90)
RDW: 15.1 % — AB (ref 11.5–14.5)
WBC: 4 10*3/uL (ref 3.8–10.6)

## 2018-02-06 LAB — IRON AND TIBC
Iron: 80 ug/dL (ref 45–182)
Saturation Ratios: 28 % (ref 17.9–39.5)
TIBC: 284 ug/dL (ref 250–450)
UIBC: 204 ug/dL

## 2018-02-06 LAB — PROTIME-INR
INR: 1.53
PROTHROMBIN TIME: 18.3 s — AB (ref 11.4–15.2)

## 2018-02-06 LAB — FERRITIN: Ferritin: 198 ng/mL (ref 24–336)

## 2018-02-06 LAB — VITAMIN B12: Vitamin B-12: 911 pg/mL (ref 180–914)

## 2018-02-06 LAB — FOLATE: FOLATE: 4.6 ng/mL — AB (ref >5.9)

## 2018-02-06 LAB — APTT: aPTT: 38 seconds — ABNORMAL HIGH (ref 24–36)

## 2018-02-06 LAB — LACTATE DEHYDROGENASE: LDH: 404 U/L — AB (ref 98–192)

## 2018-02-06 NOTE — Patient Instructions (Signed)
STOP quinine

## 2018-02-06 NOTE — Progress Notes (Signed)
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 Alcohol/ Quinine  INTERVAL HISTORY:  A pleasant 72 year old Caucasian male patient with above history of iron deficiency anemia from previous GI bleed; is here for follow-up.  Patient was seen by me approximately 2 years ago for thrombocytopenia and anemia.  In the interim patient was being followed by PCP-and has been referred to [place] because of worsening thrombocytopenia.  Most recently platelets in 28s [baseline 80s to 100s]  Patient states that she has been drinking alcohol/liquor every day-to help with his right shoulder pain/and also neuropathy in his legs.  He also takes quinine almost on a daily basis for his leg cramps.  In general his appetite is good. He is not losing any significant weight. Denies any chest pain or shortness of breath or cough. He denies any blood in stools black stools.  Patient does complain of easy bruising/but no gum bleeding or nosebleeds.  REVIEW OF SYSTEMS:  A complete 10 point review of system is done which is negative except mentioned above/history of present illness.   PAST MEDICAL HISTORY :  Past Medical History:  Diagnosis Date  . ASCVD (arteriosclerotic cardiovascular disease)   . Chronic pulmonary hypertension ([place])   . Diabetes mellitus without complication ([place])   . GERD (gastroesophageal reflux disease)   . Heart attack ([place]) 1989  . Heart disease   . Hyperlipidemia   . IDA (iron deficiency anemia)   . Moderate mitral insufficiency   . Moderate tricuspid insufficiency   . OA (osteoarthritis)   . Paroxysmal A-fib ([place])   . Thrombocytosis ([place])   . Tremor     PAST SURGICAL HISTORY : Past surgical history reviewed.     FAMILY HISTORY :   Family History  Problem Relation Age of Onset  . Heart attack Father     SOCIAL HISTORY:   Social History   Tobacco Use  . Smoking status: Former Smoker    Packs/day: 0.50    Years: 40.00    Pack years: 20.00    Types: Cigarettes    Last attempt to quit: 04/18/1997    Years since quitting: 20.8  . Smokeless tobacco: Never Used  Substance Use Topics  . Alcohol use: Yes    Alcohol/week: 30.0 oz    Types: 50 Shots of liquor per week    Comment: "I mix a couple cups whisky/day"  . Drug use: Not Currently    ALLERGIES:  has no allergies on file.  MEDICATIONS:  Current Outpatient Medications  Medication Sig Dispense Refill  . atenolol (TENORMIN) 25 MG tablet Take 25 mg by mouth 2 (two) times daily.     . hydrochlorothiazide (HYDRODIURIL) 25 MG tablet Take 25 mg by mouth daily.     . potassium chloride SA (K-DUR,KLOR-CON) 20 MEQ tablet Take 1 tablet by mouth 2 (two) times daily.  10  . quiNINE (QUALAQUIN) 324 MG capsule Take 324 mg by mouth daily as needed.    Marland Kitchen JANUMET 50-500 MG tablet Take 1 tablet by mouth daily.  10   No current facility-administered medications for this visit.     PHYSICAL EXAMINATION:  BP (!) 153/89 (BP Location: Left Arm, Patient Position: Sitting)   Pulse 86  Temp 97.6 F (36.4 C) (Tympanic)   Resp 20   Ht 5\' 9"  (1.753 m)   Wt 235 lb (106.6 kg)   BMI 34.70 kg/m   Filed Weights   02/06/18 1406  Weight: 235 lb (106.6 kg)    GENERAL: Well-nourished well-developed; Alert, no distress and comfortable.  He is walking by himself.  He is alone. EYES: no pallor or icterus OROPHARYNX: no thrush or ulceration; good dentition  NECK: supple, no masses felt LYMPH:  no palpable lymphadenopathy in the cervical, axillary or inguinal regions LUNGS: clear to auscultation and  No wheeze or crackles HEART/CVS: regular rate & rhythm and no murmurs; No lower extremity edema ABDOMEN:abdomen soft, non-tender and normal bowel  sounds Musculoskeletal:no cyanosis of digits and no clubbing  PSYCH: alert & oriented x 3 with fluent speech NEURO: no focal motor/sensory deficits; he has mild tremors in his hands.  SKIN:  no rashes or significant lesions  LABORATORY DATA:  I have reviewed the data as listed    Component Value Date/Time   NA 135 02/06/2018 1521   K 3.0 (L) 02/06/2018 1521   CL 92 (L) 02/06/2018 1521   CO2 26 02/06/2018 1521   GLUCOSE 109 (H) 02/06/2018 1521   BUN 10 02/06/2018 1521   CREATININE 0.85 02/06/2018 1521   CREATININE 0.86 01/25/2015 0856   CALCIUM 8.7 (L) 02/06/2018 1521   PROT 7.9 02/06/2018 1521   ALBUMIN 3.3 (L) 02/06/2018 1521   AST 283 (H) 02/06/2018 1521   ALT 51 02/06/2018 1521   ALKPHOS 115 02/06/2018 1521   BILITOT 5.8 (H) 02/06/2018 1521   GFRNONAA >60 02/06/2018 1521   GFRNONAA >60 01/25/2015 0856   GFRAA >60 02/06/2018 1521   GFRAA >60 01/25/2015 0856    No results found for: SPEP, UPEP  Lab Results  Component Value Date   WBC 4.0 02/06/2018   NEUTROABS 2.7 02/06/2018   HGB 13.7 02/06/2018   HCT 39.0 (L) 02/06/2018   MCV 107.7 (H) 02/06/2018   PLT 40 (L) 02/06/2018      Chemistry      Component Value Date/Time   NA 135 02/06/2018 1521   K 3.0 (L) 02/06/2018 1521   CL 92 (L) 02/06/2018 1521   CO2 26 02/06/2018 1521   BUN 10 02/06/2018 1521   CREATININE 0.85 02/06/2018 1521   CREATININE 0.86 01/25/2015 0856      Component Value Date/Time   CALCIUM 8.7 (L) 02/06/2018 1521   ALKPHOS 115 02/06/2018 1521   AST 283 (H) 02/06/2018 1521   ALT 51 02/06/2018 1521   BILITOT 5.8 (H) 02/06/2018 1521       RADIOGRAPHIC STUDIES: I have personally reviewed the radiological images as listed and agreed with the findings in the report. No results found.   ASSESSMENT & PLAN:   Thrombocytopenia ([place]) #Thrombocytopenia- initially noted in January 2016 ~130s; \Patient also has mild macrocytosis with MCV 103.  However more recently platelets- in 53s; currently  asymptomatic-except for mild bruising.   #I suspect alcoholism-both acute/and chronic [? Cirrhosis/hypersplenism]- because of patient's ongoing thrombocytopenia.  Long discussion the patient regarding alcohol-patient is not interested in quitting alcohol.  He also requests that I did not discuss alcohol cessation in presence of his wife/family.  Recommend discontinuation of quinine.   # Iron deficiency anemia- likely from history of GI bleed/AVMs status post IV iron infusion in October 2015. Today hemoglobin is 15.3. 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   # Alcohol abuse-  Discussed the importance of alcohol cessation/patient declines any counseling stating "I love alcohol".  #Recommend getting labs today CBC CMP zinc folic acid W29/HBZJIR LDH iron studies PT/INR; also get an ultrasound of the spleen. Hepatitis- work up.   # will get labs today/patient also follow-up in mid May to discuss treatment options.   Cc; Dr.Sparks    Orders Placed This Encounter  Procedures  . US SPLEEN (ABDOMEN LIMITED)    Standing Status:   Future    Standing Expiration Date:   04/09/2019    Order Specific Question:   Reason for Exam (SYMPTOM  OR DIAGNOSIS REQUIRED)    Answer:   splenomegaly    Order Specific Question:   Preferred imaging location?    Answer:   [facility]  . CBC with Differential/Platelet    Standing Status:   Future    Number of Occurrences:   1    Standing Expiration Date:   03/13/2019  . Lactate dehydrogenase    Standing Status:   Future    Number of Occurrences:   1    Standing Expiration Date:   03/13/2019  . Comprehensive metabolic panel    Standing Status:   Future    Number of Occurrences:   1    Standing Expiration Date:   03/13/2019  . APTT    Standing Status:   Future    Number of Occurrences:   1    Standing Expiration Date:   02/06/2019  . Protime-INR    Standing Status:   Future    Number of Occurrences:   1    Standing  Expiration Date:   02/06/2019  . Vitamin B12    Standing Status:   Future    Number of Occurrences:   1    Standing Expiration Date:   02/06/2019  . Folate    Standing Status:   Future    Number of Occurrences:   1    Standing Expiration Date:   02/06/2019  . Copper, serum    Standing Status:   Future    Number of Occurrences:   1    Standing Expiration Date:   01/08/2019  . Zinc    Standing Status:   Future    Number of Occurrences:   1    Standing Expiration Date:   02/06/2019  . Iron and TIBC    Standing Status:   Future    Number of Occurrences:   1    Standing Expiration Date:   03/13/2019  . Ferritin    Standing Status:   Future    Number of Occurrences:   1    Standing Expiration Date:   03/13/2019  . Hepatitis B surface antigen    Standing Status:   Future    Number of Occurrences:   1    Standing Expiration Date:   02/06/2019  . Hepatitis C antibody    Standing Status:   Future    Number of Occurrences:   1    Standing Expiration Date:   02/06/2019  . Hepatitis B core antibody, IgM    Standing Status:   Future    Number of Occurrences:   1    Standing Expiration Date:   02/06/2019  . CBC with Differential/Platelet    Standing Status:   Future    Standing Expiration Date:   02/07/2019   All questions were answered. The patient knows to call the clinic with any problems, questions or concerns. No barriers  to learning was detected.  # 25 minutes face-to-face with the patient discussing the above plan of care; more than 50% of time spent on prognosis/ natural history; counseling and coordination.      Cammie Sickle, MD 02/07/2018 6:22 PM

## 2018-02-06 NOTE — Progress Notes (Signed)
patient being referred back by Dr. Doy Hutching to reevaluate IDA and thrombocytopenia. Last plt count was 30. Patient denies any rectal bleeding or states that he bruises very easily.

## 2018-02-06 NOTE — Assessment & Plan Note (Addendum)
#  Thrombocytopenia- initially noted in January 2016 ~130s; \Patient also has mild macrocytosis with MCV 103.  However more recently platelets- in 84s; currently asymptomatic-except for mild bruising.   #I suspect alcoholism-both acute/and chronic [? Cirrhosis/hypersplenism]- because of patient's ongoing thrombocytopenia.  Long discussion the patient regarding alcohol-patient is not interested in quitting alcohol.  He also requests that I did not discuss alcohol cessation in presence of his wife/family.  Recommend discontinuation of quinine.   # Iron deficiency anemia- likely from history of GI bleed/AVMs status post IV iron infusion in October 2015. Today hemoglobin is 15.3. 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   # Alcohol abuse- Discussed the importance of alcohol cessation/patient declines any counseling stating "I love alcohol".  #Recommend getting labs today CBC CMP zinc folic acid B71/IRCVEL LDH iron studies PT/INR; also get an ultrasound of the spleen. Hepatitis- work up.   # will get labs today/patient also follow-up in mid May to discuss treatment options.   Cc; Dr.Sparks

## 2018-02-07 LAB — HEPATITIS B SURFACE ANTIGEN: HEP B S AG: NEGATIVE

## 2018-02-07 LAB — COPPER, SERUM: COPPER: 101 ug/dL (ref 72–166)

## 2018-02-07 LAB — ZINC: ZINC: 51 ug/dL — AB (ref 56–134)

## 2018-02-07 LAB — HEPATITIS B CORE ANTIBODY, IGM: HEP B C IGM: NEGATIVE

## 2018-02-07 LAB — HEPATITIS C ANTIBODY: HCV AB: 0.1 {s_co_ratio} (ref 0.0–0.9)

## 2018-03-03 DIAGNOSIS — I10 Essential (primary) hypertension: Secondary | ICD-10-CM | POA: Diagnosis present

## 2018-03-11 ENCOUNTER — Ambulatory Visit
Admission: RE | Admit: 2018-03-11 | Discharge: 2018-03-11 | Disposition: A | Discharge status: Home / Self Care | Payer: Medicare Other | Source: Ambulatory Visit | Attending: Internal Medicine | Admitting: Internal Medicine

## 2018-03-11 DIAGNOSIS — R161 Splenomegaly, not elsewhere classified: Secondary | ICD-10-CM | POA: Insufficient documentation

## 2018-03-11 DIAGNOSIS — D696 Thrombocytopenia, unspecified: Secondary | ICD-10-CM | POA: Diagnosis not present

## 2018-03-11 DIAGNOSIS — J90 Pleural effusion, not elsewhere classified: Secondary | ICD-10-CM | POA: Diagnosis not present

## 2018-03-13 ENCOUNTER — Encounter: Payer: Self-pay | Admitting: Internal Medicine

## 2018-03-13 ENCOUNTER — Inpatient Hospital Stay: Payer: Medicare Other | Attending: Internal Medicine

## 2018-03-13 ENCOUNTER — Inpatient Hospital Stay: Payer: Medicare Other | Admitting: Internal Medicine

## 2018-03-13 VITALS — BP 129/81 | HR 75 | Temp 97.9°F | Resp 18 | Ht 69.0 in | Wt 232.0 lb

## 2018-03-13 DIAGNOSIS — Z87891 Personal history of nicotine dependence: Secondary | ICD-10-CM | POA: Diagnosis not present

## 2018-03-13 DIAGNOSIS — Z79899 Other long term (current) drug therapy: Secondary | ICD-10-CM

## 2018-03-13 DIAGNOSIS — E119 Type 2 diabetes mellitus without complications: Secondary | ICD-10-CM

## 2018-03-13 DIAGNOSIS — E876 Hypokalemia: Secondary | ICD-10-CM | POA: Insufficient documentation

## 2018-03-13 DIAGNOSIS — M7989 Other specified soft tissue disorders: Secondary | ICD-10-CM | POA: Insufficient documentation

## 2018-03-13 DIAGNOSIS — K7460 Unspecified cirrhosis of liver: Secondary | ICD-10-CM | POA: Insufficient documentation

## 2018-03-13 DIAGNOSIS — D539 Nutritional anemia, unspecified: Secondary | ICD-10-CM | POA: Diagnosis not present

## 2018-03-13 DIAGNOSIS — D696 Thrombocytopenia, unspecified: Secondary | ICD-10-CM

## 2018-03-13 DIAGNOSIS — F1010 Alcohol abuse, uncomplicated: Secondary | ICD-10-CM | POA: Insufficient documentation

## 2018-03-13 DIAGNOSIS — R0602 Shortness of breath: Secondary | ICD-10-CM

## 2018-03-13 DIAGNOSIS — E538 Deficiency of other specified B group vitamins: Secondary | ICD-10-CM | POA: Diagnosis not present

## 2018-03-13 LAB — CBC WITH DIFFERENTIAL/PLATELET
Basophils Absolute: 0 10*3/uL (ref 0–0.1)
Basophils Relative: 1 %
Eosinophils Absolute: 0.1 10*3/uL (ref 0–0.7)
Eosinophils Relative: 3 %
HEMATOCRIT: 37.7 % — AB (ref 40.0–52.0)
HEMOGLOBIN: 13.5 g/dL (ref 13.0–18.0)
LYMPHS ABS: 0.9 10*3/uL — AB (ref 1.0–3.6)
Lymphocytes Relative: 20 %
MCH: 40.9 pg — AB (ref 26.0–34.0)
MCHC: 35.7 g/dL (ref 32.0–36.0)
MCV: 114.5 fL — AB (ref 80.0–100.0)
Monocytes Absolute: 0.6 10*3/uL (ref 0.2–1.0)
Monocytes Relative: 15 %
NEUTROS ABS: 2.7 10*3/uL (ref 1.4–6.5)
NEUTROS PCT: 61 %
Platelets: 70 10*3/uL — ABNORMAL LOW (ref 150–440)
RBC: 3.29 MIL/uL — AB (ref 4.40–5.90)
RDW: 16.7 % — ABNORMAL HIGH (ref 11.5–14.5)
WBC: 4.4 10*3/uL (ref 3.8–10.6)

## 2018-03-13 MED ORDER — FOLIC ACID 1 MG PO TABS: 1.0000 mg | ORAL_TABLET | Freq: Every day | ORAL | 1 refills | Status: DC

## 2018-03-13 NOTE — Assessment & Plan Note (Addendum)
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  Asymptomatic except for easy bruising [see discussion below] stable.  #Mild intermittent macrocytic anemia-secondary to cirrhosis liver disease for deficiency.  Stable.    # Folate deficiency-new.  Recommend folic acid once a day prescription sent - # Iron deficiency anemia- likely from history of GI bleed/AVMs status post IV iron infusion in October 2015. Today hemoglobin is 15.3. No iron infusion needed.  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  Stable.  #Hypokalemia-worse.  Potassium 3.0 recommend compliance.  #Cirrhosis-decompensated child Pugh B/C-worsened; secondary to alcohol.  # Alcohol abuse-discussed cessation; patient not interested in abstinence.  Unfortunately prognosis overall poor.  # follow up in 6 months/   Cc; Dr.Sparks

## 2018-03-13 NOTE — Progress Notes (Signed)
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  Patient continues to complain of easy bruising.  Otherwise no gum bleeding.  Denies any nosebleeds.  Unfortunately continues to drink alcohol-states to help him relax; help with his joint pains.  Complains of leg swelling.  Complains of fatigue.  Complains of shortness of breath on exertion.  Review of Systems  Constitutional: Negative for chills, diaphoresis, fever, malaise/fatigue and weight loss.  HENT: Negative for nosebleeds and sore throat.   Eyes: Negative for double vision.  Respiratory: Positive for shortness of breath. Negative for cough, hemoptysis, sputum production and wheezing.   Cardiovascular: Positive for leg swelling. Negative for chest pain, palpitations and orthopnea.  Gastrointestinal: Negative for abdominal pain, blood in stool, constipation, diarrhea, heartburn, melena, nausea and vomiting.  Genitourinary: Negative for dysuria, frequency and urgency.  Musculoskeletal: Negative for back pain and joint pain.  Skin: Negative.  Negative for itching and rash.  Neurological: Negative for dizziness, tingling, focal weakness, weakness and headaches.  Endo/Heme/Allergies: Bruises/bleeds easily.  Psychiatric/Behavioral: Negative for depression. The patient is not nervous/anxious and does not have insomnia.      PAST MEDICAL HISTORY :  Past Medical History:  Diagnosis Date  . ASCVD (arteriosclerotic cardiovascular disease)   . Chronic  pulmonary hypertension ([place])   . Diabetes mellitus without complication ([place])   . GERD (gastroesophageal reflux disease)   . Heart attack ([place]) 1989  . Heart disease   . Hyperlipidemia   . IDA (iron deficiency anemia)   . Moderate mitral insufficiency   . Moderate tricuspid insufficiency   . OA (osteoarthritis)   . Paroxysmal A-fib ([place])   . Thrombocytosis ([place])   . Tremor     PAST SURGICAL HISTORY : Past surgical history reviewed.    FAMILY HISTORY :   Family History  Problem Relation Age of Onset  . Heart attack Father     SOCIAL HISTORY:   Social History   Tobacco Use  . Smoking status: Former Smoker    Packs/day: 0.50    Years: 40.00    Pack years: 20.00    Types: Cigarettes    Last attempt to quit: 04/18/1997    Years since quitting: 20.9  . Smokeless tobacco: Never Used  Substance Use Topics  . Alcohol use: Yes    Alcohol/week: 30.0 oz    Types: 50 Shots of liquor per week    Comment: "I mix a couple cups whisky/day"  . Drug use: Not Currently    ALLERGIES:  has no allergies on file.  MEDICATIONS:  Current Outpatient Medications  Medication Sig Dispense Refill  . atenolol (TENORMIN) 25 MG tablet Take 25 mg by mouth 2 (two) times daily.     . hydrochlorothiazide (HYDRODIURIL) 25 MG tablet Take 25 mg by mouth daily.     Marland Kitchen JANUMET 50-500 MG tablet Take 1 tablet by mouth daily.  10  . simvastatin (ZOCOR) 40 MG tablet Take 40 mg by mouth daily.    Marland Kitchen acetaminophen (TYLENOL) 500 MG tablet Take 500 mg by mouth every  8 (eight) hours as needed.    . folic acid (FOLVITE) 1 MG tablet Take 1 tablet (1 mg total) by mouth daily. 90 tablet 1  . potassium chloride SA (K-DUR,KLOR-CON) 20 MEQ tablet Take 1 tablet by mouth 2 (two) times daily.  10  . quiNINE (QUALAQUIN) 324 MG capsule Take 324 mg by mouth daily as needed.     No current facility-administered medications for this visit.     PHYSICAL EXAMINATION:  BP 129/81 (BP Location: Left Arm, Patient Position:  Sitting)   Pulse 75   Temp 97.9 F (36.6 C)   Resp 18   Ht 5\' 9"  (1.753 m)   Wt 232 lb (105.2 kg)   SpO2 93%   BMI 34.26 kg/m   Filed Weights   03/13/18 1419  Weight: 232 lb (105.2 kg)    Physical Exam  Constitutional: He is oriented to person, place, and time and well-developed, well-nourished, and in no distress.  HENT:  Head: Normocephalic and atraumatic.  Mouth/Throat: Oropharynx is clear and moist. No oropharyngeal exudate.  Eyes: Pupils are equal, round, and reactive to light.  Neck: Normal range of motion. Neck supple.  Cardiovascular: Normal rate and regular rhythm.  Pulmonary/Chest: No respiratory distress. He has no wheezes.  Abdominal: Soft. Bowel sounds are normal. He exhibits no distension and no mass. There is no tenderness. There is no rebound and no guarding.  Musculoskeletal: Normal range of motion. He exhibits no tenderness.       Right lower leg: He exhibits edema.       Left lower leg: He exhibits edema.  Neurological: He is alert and oriented to person, place, and time.  Skin: Skin is warm.  Psychiatric: Affect normal.     LABORATORY DATA:  I have reviewed the data as listed    Component Value Date/Time   NA 135 02/06/2018 1521   K 3.0 (L) 02/06/2018 1521   CL 92 (L) 02/06/2018 1521   CO2 26 02/06/2018 1521   GLUCOSE 109 (H) 02/06/2018 1521   BUN 10 02/06/2018 1521   CREATININE 0.85 02/06/2018 1521   CREATININE 0.86 01/25/2015 0856   CALCIUM 8.7 (L) 02/06/2018 1521   PROT 7.9 02/06/2018 1521   ALBUMIN 3.3 (L) 02/06/2018 1521   AST 283 (H) 02/06/2018 1521   ALT 51 02/06/2018 1521   ALKPHOS 115 02/06/2018 1521   BILITOT 5.8 (H) 02/06/2018 1521   GFRNONAA >60 02/06/2018 1521   GFRNONAA >60 01/25/2015 0856   GFRAA >60 02/06/2018 1521   GFRAA >60 01/25/2015 0856    No results found for: SPEP, UPEP  Lab Results  Component Value Date   WBC 4.4 03/13/2018   NEUTROABS 2.7 03/13/2018   HGB 13.5 03/13/2018   HCT 37.7 (L) 03/13/2018   MCV  114.5 (H) 03/13/2018   PLT 70 (L) 03/13/2018      Chemistry      Component Value Date/Time   NA 135 02/06/2018 1521   K 3.0 (L) 02/06/2018 1521   CL 92 (L) 02/06/2018 1521   CO2 26 02/06/2018 1521   BUN 10 02/06/2018 1521   CREATININE 0.85 02/06/2018 1521   CREATININE 0.86 01/25/2015 0856      Component Value Date/Time   CALCIUM 8.7 (L) 02/06/2018 1521   ALKPHOS 115 02/06/2018 1521   AST 283 (H) 02/06/2018 1521   ALT 51 02/06/2018 1521   BILITOT 5.8 (H) 02/06/2018 1521       RADIOGRAPHIC STUDIES: I have personally reviewed the radiological images  as listed and agreed with the findings in the report. No results found.   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  Asymptomatic except for easy bruising [see discussion below] stable.  #Mild intermittent macrocytic anemia-secondary to cirrhosis liver disease for deficiency.  Stable.    # Folate deficiency-new.  Recommend folic acid once a day prescription sent - # Iron deficiency anemia- likely from history of GI bleed/AVMs status post IV iron infusion in October 2015. Today hemoglobin is 15.3. No iron infusion needed.  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  Stable.  #Hypokalemia-worse.  Potassium 3.0 recommend compliance.  #Cirrhosis-decompensated child Pugh B/C-worsened; secondary to alcohol.  # Alcohol abuse-discussed cessation; patient not interested in abstinence.  Unfortunately prognosis overall poor.  # follow up in 6 months/   Cc; Dr.Sparks    Orders Placed This Encounter  Procedures  . CBC with Differential/Platelet    Standing Status:   Future    Standing Expiration Date:   03/14/2019  . Comprehensive metabolic panel    Standing Status:   Future    Standing Expiration Date:   03/14/2019  . Folate    Standing Status:   Future    Standing Expiration Date:   03/14/2019   All questions were answered. The patient knows to call the  clinic with any problems, questions or concerns. No barriers to learning was detected.  # 25 minutes face-to-face with the patient discussing the above plan of care; more than 50% of time spent on prognosis/ natural history; counseling and coordination.      Cammie Sickle, MD 03/15/2018 9:07 PM

## 2018-06-02 ENCOUNTER — Other Ambulatory Visit: Payer: Self-pay | Admitting: Internal Medicine

## 2018-06-02 DIAGNOSIS — R188 Other ascites: Secondary | ICD-10-CM

## 2018-06-02 DIAGNOSIS — J90 Pleural effusion, not elsewhere classified: Secondary | ICD-10-CM

## 2018-06-03 ENCOUNTER — Ambulatory Visit: Payer: Medicare Other

## 2018-06-05 ENCOUNTER — Ambulatory Visit
Admission: RE | Admit: 2018-06-05 | Discharge: 2018-06-05 | Disposition: A | Discharge status: Home / Self Care | Payer: Medicare Other | Source: Ambulatory Visit | Attending: Internal Medicine | Admitting: Internal Medicine

## 2018-06-05 DIAGNOSIS — R0602 Shortness of breath: Secondary | ICD-10-CM | POA: Diagnosis not present

## 2018-06-05 DIAGNOSIS — K429 Umbilical hernia without obstruction or gangrene: Secondary | ICD-10-CM | POA: Insufficient documentation

## 2018-06-05 DIAGNOSIS — R918 Other nonspecific abnormal finding of lung field: Secondary | ICD-10-CM

## 2018-06-05 DIAGNOSIS — I2510 Atherosclerotic heart disease of native coronary artery without angina pectoris: Secondary | ICD-10-CM | POA: Insufficient documentation

## 2018-06-05 DIAGNOSIS — R188 Other ascites: Secondary | ICD-10-CM | POA: Insufficient documentation

## 2018-06-05 DIAGNOSIS — I429 Cardiomyopathy, unspecified: Secondary | ICD-10-CM | POA: Insufficient documentation

## 2018-06-05 DIAGNOSIS — J90 Pleural effusion, not elsewhere classified: Secondary | ICD-10-CM | POA: Insufficient documentation

## 2018-06-05 DIAGNOSIS — K7460 Unspecified cirrhosis of liver: Secondary | ICD-10-CM | POA: Insufficient documentation

## 2018-06-05 DIAGNOSIS — I700 Atherosclerosis of aorta: Secondary | ICD-10-CM

## 2018-06-05 DIAGNOSIS — X58XXXA Exposure to other specified factors, initial encounter: Secondary | ICD-10-CM

## 2018-06-05 DIAGNOSIS — I480 Paroxysmal atrial fibrillation: Secondary | ICD-10-CM

## 2018-06-05 DIAGNOSIS — S2242XA Multiple fractures of ribs, left side, initial encounter for closed fracture: Secondary | ICD-10-CM

## 2018-06-05 HISTORY — DX: Essential (primary) hypertension: I10

## 2018-06-05 LAB — POCT I-STAT CREATININE: Creatinine, Ser: 1.1 mg/dL (ref 0.61–1.24)

## 2018-06-05 MED ORDER — IOHEXOL 300 MG/ML  SOLN
100.0000 mL | Freq: Once | INTRAMUSCULAR | Status: AC | PRN
  Administered 2018-06-05: 100 mL via INTRAVENOUS

## 2018-06-08 ENCOUNTER — Inpatient Hospital Stay
Admission: EM | Admit: 2018-06-08 | Discharge: 2018-06-11 | DRG: 187 | Disposition: A | Discharge status: Home / Self Care | Payer: Medicare Other | Attending: Internal Medicine | Admitting: Internal Medicine

## 2018-06-08 ENCOUNTER — Encounter: Payer: Self-pay | Admitting: *Deleted

## 2018-06-08 ENCOUNTER — Emergency Department: Payer: Medicare Other

## 2018-06-08 ENCOUNTER — Other Ambulatory Visit: Payer: Self-pay

## 2018-06-08 DIAGNOSIS — K7031 Alcoholic cirrhosis of liver with ascites: Secondary | ICD-10-CM | POA: Diagnosis present

## 2018-06-08 DIAGNOSIS — I4892 Unspecified atrial flutter: Secondary | ICD-10-CM | POA: Diagnosis present

## 2018-06-08 DIAGNOSIS — Z9889 Other specified postprocedural states: Secondary | ICD-10-CM

## 2018-06-08 DIAGNOSIS — Z66 Do not resuscitate: Secondary | ICD-10-CM | POA: Diagnosis present

## 2018-06-08 DIAGNOSIS — I480 Paroxysmal atrial fibrillation: Secondary | ICD-10-CM | POA: Diagnosis present

## 2018-06-08 DIAGNOSIS — D473 Essential (hemorrhagic) thrombocythemia: Secondary | ICD-10-CM

## 2018-06-08 DIAGNOSIS — R0602 Shortness of breath: Secondary | ICD-10-CM | POA: Diagnosis present

## 2018-06-08 DIAGNOSIS — Z8249 Family history of ischemic heart disease and other diseases of the circulatory system: Secondary | ICD-10-CM | POA: Diagnosis not present

## 2018-06-08 DIAGNOSIS — I10 Essential (primary) hypertension: Secondary | ICD-10-CM | POA: Diagnosis present

## 2018-06-08 DIAGNOSIS — E876 Hypokalemia: Secondary | ICD-10-CM | POA: Diagnosis present

## 2018-06-08 DIAGNOSIS — K529 Noninfective gastroenteritis and colitis, unspecified: Secondary | ICD-10-CM | POA: Diagnosis present

## 2018-06-08 DIAGNOSIS — D6959 Other secondary thrombocytopenia: Secondary | ICD-10-CM | POA: Diagnosis present

## 2018-06-08 DIAGNOSIS — E785 Hyperlipidemia, unspecified: Secondary | ICD-10-CM | POA: Diagnosis present

## 2018-06-08 DIAGNOSIS — I2510 Atherosclerotic heart disease of native coronary artery without angina pectoris: Secondary | ICD-10-CM | POA: Diagnosis present

## 2018-06-08 DIAGNOSIS — K219 Gastro-esophageal reflux disease without esophagitis: Secondary | ICD-10-CM | POA: Diagnosis present

## 2018-06-08 DIAGNOSIS — J90 Pleural effusion, not elsewhere classified: Secondary | ICD-10-CM | POA: Diagnosis present

## 2018-06-08 DIAGNOSIS — I252 Old myocardial infarction: Secondary | ICD-10-CM | POA: Diagnosis not present

## 2018-06-08 DIAGNOSIS — I272 Pulmonary hypertension, unspecified: Secondary | ICD-10-CM | POA: Diagnosis present

## 2018-06-08 DIAGNOSIS — Z87891 Personal history of nicotine dependence: Secondary | ICD-10-CM

## 2018-06-08 DIAGNOSIS — E119 Type 2 diabetes mellitus without complications: Secondary | ICD-10-CM | POA: Diagnosis present

## 2018-06-08 DIAGNOSIS — R188 Other ascites: Secondary | ICD-10-CM

## 2018-06-08 DIAGNOSIS — I081 Rheumatic disorders of both mitral and tricuspid valves: Secondary | ICD-10-CM | POA: Diagnosis present

## 2018-06-08 LAB — PROTIME-INR
INR: 1.76
Prothrombin Time: 20.4 seconds — ABNORMAL HIGH (ref 11.4–15.2)

## 2018-06-08 LAB — CBC
HCT: 30 % — ABNORMAL LOW (ref 40.0–52.0)
Hemoglobin: 10.9 g/dL — ABNORMAL LOW (ref 13.0–18.0)
MCH: 40.5 pg — AB (ref 26.0–34.0)
MCHC: 36.4 g/dL — AB (ref 32.0–36.0)
MCV: 111.4 fL — AB (ref 80.0–100.0)
PLATELETS: 129 10*3/uL — AB (ref 150–440)
RBC: 2.69 MIL/uL — ABNORMAL LOW (ref 4.40–5.90)
RDW: 13.6 % (ref 11.5–14.5)
WBC: 8.9 10*3/uL (ref 3.8–10.6)

## 2018-06-08 LAB — BASIC METABOLIC PANEL
Anion gap: 10 (ref 5–15)
BUN: 10 mg/dL (ref 8–23)
CALCIUM: 7.9 mg/dL — AB (ref 8.9–10.3)
CHLORIDE: 96 mmol/L — AB (ref 98–111)
CO2: 30 mmol/L (ref 22–32)
CREATININE: 0.98 mg/dL (ref 0.61–1.24)
GFR calc Af Amer: >60 mL/min (ref >60)
GFR calc non Af Amer: >60 mL/min (ref >60)
GLUCOSE: 119 mg/dL — AB (ref 70–99)
Potassium: 2.8 mmol/L — ABNORMAL LOW (ref 3.5–5.1)
Sodium: 136 mmol/L (ref 135–145)

## 2018-06-08 LAB — GLUCOSE, CAPILLARY: Glucose-Capillary: 150 mg/dL — ABNORMAL HIGH (ref 70–99)

## 2018-06-08 LAB — TROPONIN I: TROPONIN I: 0.03 ng/mL — AB (ref <0.03)

## 2018-06-08 LAB — MAGNESIUM: Magnesium: 1.3 mg/dL — ABNORMAL LOW (ref 1.7–2.4)

## 2018-06-08 MED ORDER — POTASSIUM CHLORIDE CRYS ER 20 MEQ PO TBCR
20.0000 meq | EXTENDED_RELEASE_TABLET | Freq: Two times a day (BID) | ORAL | Status: DC
  Administered 2018-06-09 – 2018-06-11 (×5): 20 meq via ORAL
  Filled 2018-06-08 (×5): qty 1

## 2018-06-08 MED ORDER — ATENOLOL 25 MG PO TABS
25.0000 mg | ORAL_TABLET | Freq: Two times a day (BID) | ORAL | Status: DC
Start: 2018-06-08 — End: 2018-06-11
  Administered 2018-06-08 – 2018-06-11 (×5): 25 mg via ORAL
  Filled 2018-06-08 (×7): qty 1

## 2018-06-08 MED ORDER — MAGNESIUM SULFATE 4 GM/100ML IV SOLN
4.0000 g | Freq: Once | INTRAVENOUS | Status: AC
  Administered 2018-06-08: 4 g via INTRAVENOUS
  Filled 2018-06-08: qty 100

## 2018-06-08 MED ORDER — ONDANSETRON HCL 4 MG/2ML IJ SOLN: 4.0000 mg | Freq: Four times a day (QID) | INTRAMUSCULAR | Status: DC | PRN

## 2018-06-08 MED ORDER — ALBUTEROL SULFATE (2.5 MG/3ML) 0.083% IN NEBU: 2.5000 mg | INHALATION_SOLUTION | RESPIRATORY_TRACT | Status: DC | PRN

## 2018-06-08 MED ORDER — ACETAMINOPHEN 650 MG RE SUPP: 650.0000 mg | Freq: Four times a day (QID) | RECTAL | Status: DC | PRN

## 2018-06-08 MED ORDER — FUROSEMIDE 10 MG/ML IJ SOLN
60.0000 mg | Freq: Once | INTRAMUSCULAR | Status: AC
Start: 2018-06-08 — End: 2018-06-08
  Administered 2018-06-08: 60 mg via INTRAVENOUS
  Filled 2018-06-08: qty 8

## 2018-06-08 MED ORDER — POTASSIUM CHLORIDE CRYS ER 20 MEQ PO TBCR
40.0000 meq | EXTENDED_RELEASE_TABLET | ORAL | Status: AC
  Administered 2018-06-08 – 2018-06-09 (×3): 40 meq via ORAL
  Filled 2018-06-08 (×3): qty 2

## 2018-06-08 MED ORDER — FUROSEMIDE 10 MG/ML IJ SOLN: 60.0000 mg | Freq: Two times a day (BID) | INTRAMUSCULAR | Status: DC

## 2018-06-08 MED ORDER — POLYETHYLENE GLYCOL 3350 17 G PO PACK: 17.0000 g | PACK | Freq: Every day | ORAL | Status: DC | PRN

## 2018-06-08 MED ORDER — ONDANSETRON HCL 4 MG PO TABS: 4.0000 mg | ORAL_TABLET | Freq: Four times a day (QID) | ORAL | Status: DC | PRN

## 2018-06-08 MED ORDER — ACETAMINOPHEN 325 MG PO TABS
650.0000 mg | ORAL_TABLET | Freq: Four times a day (QID) | ORAL | Status: DC | PRN
Start: 2018-06-08 — End: 2018-06-11
  Administered 2018-06-10: 650 mg via ORAL
  Filled 2018-06-08: qty 2

## 2018-06-08 MED ORDER — INSULIN ASPART 100 UNIT/ML ~~LOC~~ SOLN
0.0000 [IU] | Freq: Three times a day (TID) | SUBCUTANEOUS | Status: DC
  Administered 2018-06-09: 1 [IU] via SUBCUTANEOUS
  Administered 2018-06-09: 2 [IU] via SUBCUTANEOUS
  Administered 2018-06-10 – 2018-06-11 (×3): 1 [IU] via SUBCUTANEOUS
  Filled 2018-06-08 (×5): qty 1

## 2018-06-08 MED ORDER — MAGNESIUM SULFATE 2 GM/50ML IV SOLN: 2.0000 g | Freq: Once | INTRAVENOUS | Status: DC

## 2018-06-08 NOTE — Progress Notes (Signed)
Advance care planning  Patient is alert and oriented.  Able to make his own decisions.  Wife is present at bedside.  Patient wants his wife to be his healthcare power of attorney but does not have any documentation regarding this.  Encourage patient to finish living will and healthcare power of attorney documentation.  Patient and his wife understand patient has alcoholic cirrhosis with poor long-term prognosis.  They are hopeful for a medical and improvement with his liver function.  At this time patient is being admitted for left pleural effusion and ascites needing procedures for drainage.  Congratulated patient on quitting alcohol for 2 months now.  We discussed regarding CODE STATUS and they both agree that patient would be a DO NOT RESUSCITATE and DO NOT INTUBATE.  Would like other aggressive treatments at this time.  Orders entered   Time spent -18 minutes

## 2018-06-08 NOTE — ED Notes (Signed)
pt assisted to toilet

## 2018-06-08 NOTE — H&P (Signed)
[facility] [place] NAME: Patrick [name]    MR#:  [id number]  DATE OF BIRTH:  July 24, 1946  DATE OF ADMISSION:  06/08/2018  PRIMARY CARE PHYSICIAN: Idelle Crouch, MD   REQUESTING/REFERRING PHYSICIAN: Dr. Jacqualine Code  CHIEF COMPLAINT:   Chief Complaint  Patient presents with  . Shortness of Breath    HISTORY OF PRESENT ILLNESS:  Patrick [name]  is a 72 y.o. male with a known history of Alcoholic cirrhosis, HTN,  diabetes mellitus presented to the emergency room sent in from [facility] GI office due to shortness of breath and left pleural effusion.  Here patient saturations are 92% on room air but has shortness of breath.  Recent CT scan of the abdomen chest showed large left pleural effusion and large amount of ascites.  He does have alcoholic cirrhosis.  Quit alcohol 2 months back. No bleeding.  Nonproductive cough.  Does have chronic lower committee edema.  Has chronic diarrhea.  Uses Imodium as needed.  PAST MEDICAL HISTORY:   Past Medical History:  Diagnosis Date  . ASCVD (arteriosclerotic cardiovascular disease)   . Chronic pulmonary hypertension ([place])   . Diabetes mellitus without complication ([place])   . GERD (gastroesophageal reflux disease)   . Heart attack ([place]) 1989  . Heart disease   . Hyperlipidemia   . Hypertension   . IDA (iron deficiency anemia)   . Moderate mitral insufficiency   . Moderate tricuspid insufficiency   . OA (osteoarthritis)   . Paroxysmal A-fib ([place])   . Thrombocytosis ([place])   . Tremor     PAST SURGICAL HISTORY:   Past Surgical History:  Procedure Laterality Date  . [place]   with stainless steel stent  . CATARACT EXTRACTION    . Closed Reduction Vertebral Process Fracture  1966  . COLONOSCOPY  10/13/2014  . HEMORRHOIDECTOMY WITH HEMORRHOID BANDING    . HERNIA REPAIR     x 2  . KNEE SURGERY     bilateral knee surgery    SOCIAL HISTORY:   Social History    Tobacco Use  . Smoking status: Former Smoker    Packs/day: 0.50    Years: 40.00    Pack years: 20.00    Types: Cigarettes    Last attempt to quit: 04/18/1997    Years since quitting: 21.1  . Smokeless tobacco: Never Used  Substance Use Topics  . Alcohol use: Yes    Alcohol/week: 50.0 standard drinks    Types: 50 Shots of liquor per week    Comment: "I mix a couple cups whisky/day"    FAMILY HISTORY:   Family History  Problem Relation Age of Onset  . Heart attack Father     DRUG ALLERGIES:  No Known Allergies  REVIEW OF SYSTEMS:   Review of Systems  Constitutional: Positive for malaise/fatigue. Negative for chills and fever.  HENT: Negative for sore throat.   Eyes: Negative for blurred vision, double vision and pain.  Respiratory: Positive for cough and shortness of breath. Negative for hemoptysis and wheezing.   Cardiovascular: Positive for leg swelling. Negative for chest pain, palpitations and orthopnea.  Gastrointestinal: Negative for abdominal pain, constipation, diarrhea, heartburn, nausea and vomiting.  Genitourinary: Negative for dysuria and hematuria.  Musculoskeletal: Negative for back pain and joint pain.  Skin: Negative for rash.  Neurological: Negative for sensory change, speech change, focal weakness and headaches.  Endo/Heme/Allergies: Does not bruise/bleed easily.  Psychiatric/Behavioral: Negative for depression. The patient  is not nervous/anxious.     MEDICATIONS AT HOME:   Prior to Admission medications   Medication Sig Start Date End Date Taking? Authorizing Provider  atenolol (TENORMIN) 25 MG tablet Take 25 mg by mouth 2 (two) times daily.  01/17/15  Yes [provider]  furosemide (LASIX) 40 MG tablet Take 1 tablet by mouth daily. 03/26/18  Yes [provider]  hydrochlorothiazide (HYDRODIURIL) 25 MG tablet Take 25 mg by mouth 2 (two) times daily.  01/17/15  Yes [provider]  JANUMET 50-500 MG tablet Take 1 tablet by  mouth daily. 12/29/17  Yes [provider]  potassium chloride SA (K-DUR,KLOR-CON) 20 MEQ tablet Take 1 tablet by mouth 2 (two) times daily. 08/22/15  Yes [provider]  simvastatin (ZOCOR) 40 MG tablet Take 40 mg by mouth daily.   Yes [provider]  acetaminophen (TYLENOL) 500 MG tablet Take 500 mg by mouth every 8 (eight) hours as needed.    [provider]  folic acid (FOLVITE) 1 MG tablet Take 1 tablet (1 mg total) by mouth daily. Patient not taking: Reported on 06/08/2018 03/13/18   Cammie Sickle, MD  quiNINE Orlin Hilding) 324 MG capsule Take 324 mg by mouth daily as needed.    [provider]     VITAL SIGNS:  Blood pressure 135/86, pulse 91, temperature 98.5 F (36.9 C), temperature source Oral, resp. rate 18, height 5\' 9"  (1.753 m), weight 103.4 kg, SpO2 94 %.  PHYSICAL EXAMINATION:  Physical Exam  GENERAL:  72 y.o.-year-old patient lying in the bed with mild resp distress EYES: Pupils equal, round, reactive to light and accommodation. No scleral icterus. Extraocular muscles intact.  HEENT: Head atraumatic, normocephalic. Oropharynx and nasopharynx clear. No oropharyngeal erythema, moist oral mucosa  NECK:  Supple, no jugular venous distention. No thyroid enlargement, no tenderness.  LUNGS: Decreased breath sounds on the left side CARDIOVASCULAR: S1, S2 normal. No murmurs, rubs, or gallops.  ABDOMEN: Soft, nontender, distended. Bowel sounds present. No organomegaly or mass.  EXTREMITIES: No  cyanosis, or clubbing. + 2 pedal & radial pulses b/l.  Bilateral lower extremity edema NEUROLOGIC: Cranial nerves II through XII are intact. No focal Motor or sensory deficits appreciated b/l PSYCHIATRIC: The patient is alert and oriented x 3. Good affect.  SKIN: No obvious rash, lesion, or ulcer.   LABORATORY PANEL:   CBC Recent Labs  Lab 06/08/18 1705  WBC 8.9  HGB 10.9*  HCT 30.0*  PLT 129*    ------------------------------------------------------------------------------------------------------------------  Chemistries  Recent Labs  Lab 06/08/18 1705  NA 136  K 2.8*  CL 96*  CO2 30  GLUCOSE 119*  BUN 10  CREATININE 0.98  CALCIUM 7.9*   ------------------------------------------------------------------------------------------------------------------  Cardiac Enzymes Recent Labs  Lab 06/08/18 1705  TROPONINI 0.03*   ------------------------------------------------------------------------------------------------------------------  RADIOLOGY:  Dg Chest 2 View  Result Date: 06/08/2018 CLINICAL DATA:  Former smoker presenting with acute superimposed upon chronic shortness of breath. EXAM: CHEST - 2 VIEW COMPARISON:  CT chest 06/05/2018. FINDINGS: Cardiac silhouette upper normal in size to mildly enlarged as noted on the CT. Large LEFT pleural effusion and associated dense consolidation in the LEFT LOWER LOBE and lingula, unchanged. Lungs otherwise clear. Pulmonary vascularity normal. No RIGHT pleural effusion. Degenerative changes involving the thoracic spine and remote fracture involving the T10 vertebral body as noted on the CT. IMPRESSION: 1. Large LEFT pleural effusion and associated passive atelectasis and/or pneumonia involving the LEFT LOWER LOBE and lingula, unchanged since the CT 3  days ago. 2. No new abnormalities. Electronically Signed   By: Evangeline Dakin M.D.   On: 06/08/2018 17:32     IMPRESSION AND PLAN:   *Large left pleural effusion shortness of breath likely due to cirrhosis and ascites.  Will request ultrasound-guided thoracentesis on the left side by radiology.  Labs ordered.  Repeat chest x-ray after thoracentesis.  *Large ascites secondary to alcoholic cirrhosis.  Ultrasound-guided paracentesis ordered.  Labs ordered.  Will start patient on IV Lasix and add Aldactone.  Will need increased Lasix at discharge along with low-sodium diet and fluid  restriction.  Would add Aldactone to home medications.  *Alcoholic cirrhosis.  Patient has quit alcohol.  Follows with [facility] GI.  *Hypertension.  Continue home medications  *Hypokalemia and hypomagnesemia.  Replace through IV and oral.  *Chronic diarrhea.  Imodium as needed.  Mild thrombocytopenia secondary to cirrhosis  All the records are reviewed and case discussed with ED provider. Management plans discussed with the patient, family and they are in agreement.  CODE STATUS: DNR  TOTAL TIME TAKING CARE OF THIS PATIENT: 40 minutes.   Neita Carp M.D on 06/08/2018 at 7:22 PM  Between 7am to 6pm - Pager - [phone]  After 6pm go to [email or web address] - password [email or web address] [facility]  Office  [phone]  CC: Primary care physician; Idelle Crouch, MD  Note: This dictation was prepared with Dragon dictation along with smaller phrase technology. Any transcriptional errors that result from this process are unintentional.

## 2018-06-08 NOTE — ED Provider Notes (Signed)
[facility] Emergency Department Provider Note   ____________________________________________   First MD Initiated Contact with Patient 06/08/18 1709     (approximate)  I have reviewed the triage vital signs and the nursing notes.   HISTORY  Chief Complaint Shortness of Breath    HPI Patrick [name] is a 72 y.o. male history of cirrhosis, heart disease, A. fib  Patient presents today reports she is had increasing shortness of breath.  Feels like the left side of his lungs very congested at times.  Reports he just feels like he cannot get a full breath Patrick [name], saw gastroenterology today and they told him to come the ER that he need to be evaluated to have fluid removed from his lung and also better treatment for his "cirrhosis"  Patient reports he does not have any chest pain.  He did fall about 4 or 5 weeks ago on the left chest, and reports his left ribs been a little sore since then but nothing new and no sudden change or injury.  The shortness of breath is getting worse now to the point when he tries to walk back and forth at home he gets quite short of breath.  He is not having any other ongoing symptoms or concerns at this time.  Past Medical History:  Diagnosis Date  . ASCVD (arteriosclerotic cardiovascular disease)   . Chronic pulmonary hypertension ([place])   . Diabetes mellitus without complication ([place])   . GERD (gastroesophageal reflux disease)   . Heart attack ([place]) 1989  . Heart disease   . Hyperlipidemia   . Hypertension   . IDA (iron deficiency anemia)   . Moderate mitral insufficiency   . Moderate tricuspid insufficiency   . OA (osteoarthritis)   . Paroxysmal A-fib ([place])   . Thrombocytosis ([place])   . Tremor     Patient Active Problem List   Diagnosis Date Noted  . Thrombocytopenia ([place]) 02/06/2018  . Fecal occult blood test positive 09/08/2014  . Alcohol drinker 09/08/2014  . Absolute anemia 04/15/2014  . Arterial vascular  disease 04/15/2014  . Diabetes ([place]) 04/15/2014  . Acid reflux 04/15/2014  . 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 HLD (hyperlipidemia) 04/15/2014  . Arthritis, degenerative 04/15/2014    Past Surgical History:  Procedure Laterality Date  . [place]   with stainless steel stent  . CATARACT EXTRACTION    . Closed Reduction Vertebral Process Fracture  1966  . COLONOSCOPY  10/13/2014  . HEMORRHOIDECTOMY WITH HEMORRHOID BANDING    . HERNIA REPAIR     x 2  . KNEE SURGERY     bilateral knee surgery    Prior to Admission medications   Medication Sig Start Date End Date Taking? Authorizing Provider  atenolol (TENORMIN) 25 MG tablet Take 25 mg by mouth 2 (two) times daily.  01/17/15  Yes [provider]  furosemide (LASIX) 40 MG tablet Take 1 tablet by mouth daily. 03/26/18  Yes [provider]  hydrochlorothiazide (HYDRODIURIL) 25 MG tablet Take 25 mg by mouth 2 (two) times daily.  01/17/15  Yes [provider]  JANUMET 50-500 MG tablet Take 1 tablet by mouth daily. 12/29/17  Yes [provider]  potassium chloride SA (K-DUR,KLOR-CON) 20 MEQ tablet Take 1 tablet by mouth 2 (two) times daily. 08/22/15  Yes [provider]  simvastatin (ZOCOR) 40 MG tablet Take 40 mg by mouth daily.   Yes [provider]  acetaminophen (TYLENOL) 500 MG  tablet Take 500 mg by mouth every 8 (eight) hours as needed.    [provider]  folic acid (FOLVITE) 1 MG tablet Take 1 tablet (1 mg total) by mouth daily. Patient not taking: Reported on 06/08/2018 03/13/18   Cammie Sickle, MD  quiNINE Orlin [place]) 324 MG capsule Take 324 mg by mouth daily as needed.    [provider]    Allergies Patient has no known allergies.  Family History  Problem Relation Age of Onset  . Heart attack Father     Social History Social History   Tobacco Use  . Smoking status: Former Smoker    Packs/day: 0.50    Years:  40.00    Pack years: 20.00    Types: Cigarettes    Last attempt to quit: 04/18/1997    Years since quitting: 21.1  . Smokeless tobacco: Never Used  Substance Use Topics  . Alcohol use: Yes    Alcohol/week: 50.0 standard drinks    Types: 50 Shots of liquor per week    Comment: "I mix a couple cups whisky/day"  . Drug use: Not Currently    Review of Systems Constitutional: No fever/chills Eyes: No visual changes. ENT: No sore throat. Cardiovascular: Denies chest pain. Respiratory: Hartness of breath, some at rest much worse when he tries to walk Gastrointestinal: No abdominal pain.  No nausea, no vomiting.  No diarrhea.  No constipation. Genitourinary: Negative for dysuria. Musculoskeletal: Negative for back pain. Skin: Negative for rash. Neurological: Negative for headaches, focal weakness or numbness.    ____________________________________________   PHYSICAL EXAM:  VITAL SIGNS: ED Triage Vitals  Enc Vitals Group     BP 06/08/18 1643 116/64     Pulse Rate 06/08/18 1643 84     Resp 06/08/18 1643 (!) 22     Temp 06/08/18 1643 98.5 F (36.9 C)     Temp Source 06/08/18 1643 Oral     SpO2 06/08/18 1643 96 %     Weight 06/08/18 1641 228 lb (103.4 kg)     Height 06/08/18 1641 5\' 9"  (1.753 m)     Head Circumference --      Peak Flow --      Pain Score 06/08/18 1640 0     Pain Loc --      Pain Edu? --      Excl. in [place]? --     Constitutional: Alert and oriented.  Mildly dyspneic, slight tachypnea but in no acute extremitas.  Does appear to slightly short of breath. Eyes: Conjunctivae are normal. Head: Atraumatic. Nose: No congestion/rhinnorhea. Mouth/Throat: Mucous membranes are moist. Neck: No stridor.   Cardiovascular: Normal rate, regular rhythm. Grossly normal heart sounds.  Good peripheral circulation. Respiratory: Very diminished lung sounds over the left.  The right lung is clear.  Sits upright, slight tachypnea with just very minimal use of accessory muscles.   Speaks in phrases, appears to slightly dyspneic. Gastrointestinal: Soft and slightly distended, consistent with ascites.  No focal tenderness.  No peritonitis.  Denies abdominal pain. Musculoskeletal: No lower extremity tenderness nor edema. Neurologic:  Normal speech and language. No gross focal neurologic deficits are appreciated.  Skin:  Skin is warm, dry and intact. No rash noted. Psychiatric: Mood and affect are normal. Speech and behavior are normal.  ____________________________________________   LABS (all labs ordered are listed, but only abnormal results are displayed)  Labs Reviewed  BASIC METABOLIC PANEL - Abnormal; Notable for the following components:      Result  Value   Potassium 2.8 (*)    Chloride 96 (*)    Glucose, Bld 119 (*)    Calcium 7.9 (*)    All other components within normal limits  CBC - Abnormal; Notable for the following components:   RBC 2.69 (*)    Hemoglobin 10.9 (*)    HCT 30.0 (*)    MCV 111.4 (*)    MCH 40.5 (*)    MCHC 36.4 (*)    Platelets 129 (*)    All other components within normal limits  TROPONIN I - Abnormal; Notable for the following components:   Troponin I 0.03 (*)    All other components within normal limits  PROTIME-INR  MAGNESIUM   ____________________________________________  EKG  Reviewed and entered by me at 1645 Heart rate 110 QRS 99 QTc 420 Atrial fibrillation, no evidence of acute ischemia ____________________________________________  RADIOLOGY  Dg Chest 2 View  Result Date: 06/08/2018 CLINICAL DATA:  Former smoker presenting with acute superimposed upon chronic shortness of breath. EXAM: CHEST - 2 VIEW COMPARISON:  CT chest 06/05/2018. FINDINGS: Cardiac silhouette upper normal in size to mildly enlarged as noted on the CT. Large LEFT pleural effusion and associated dense consolidation in the LEFT LOWER LOBE and lingula, unchanged. Lungs otherwise clear. Pulmonary vascularity normal. No RIGHT pleural effusion.  Degenerative changes involving the thoracic spine and remote fracture involving the T10 vertebral body as noted on the CT. IMPRESSION: 1. Large LEFT pleural effusion and associated passive atelectasis and/or pneumonia involving the LEFT LOWER LOBE and lingula, unchanged since the CT 3 days ago. 2. No new abnormalities. Electronically Signed   By: Evangeline Dakin M.D.   On: 06/08/2018 17:32    ____________________________________________   PROCEDURES  Procedure(s) performed: None  Procedures  Critical Care performed: No  ____________________________________________   INITIAL IMPRESSION / ASSESSMENT AND PLAN / ED COURSE  Pertinent labs & imaging results that were available during my care of the patient were reviewed by me and considered in my medical decision making (see chart for details).  Patient returns for evaluation of dyspnea, referred by gastroenterology clinic.  Found to have a large left pleural effusion, associated atelectasis versus pneumonia.  Denies infectious symptoms, appears most consistent with likely pleural effusion.  Chronic underlying rib fractures are denoted, and the patient does report a fall a few weeks ago.  At this point I do not suspect that this would represent a hemothorax, suspect this is likely a transudate though cannot exclude exudate at this time.  Discussed with the patient, given his associated dyspnea and progressive increased work of breathing, we will admit to the hospital for further evaluation and likely he will need to undergo ultrasound-guided thoracentesis.   ----------------------------------------- 7:07 PM on 06/08/2018 -----------------------------------------  Patient resting with family at the bedside.  Understanding and agreeable to plan for further evaluation with internal medicine.  Case discussed with Dr. Darvin Neighbours.     ____________________________________________   FINAL CLINICAL IMPRESSION(S) / ED DIAGNOSES  Final diagnoses:    Shortness of breath  Pleural effusion on left      NEW MEDICATIONS STARTED DURING THIS VISIT:  New Prescriptions   No medications on file     Note:  This document was prepared using Dragon voice recognition software and may include unintentional dictation errors.     Delman Kitten, MD 06/08/18 1907

## 2018-06-08 NOTE — ED Triage Notes (Signed)
Pt to triage via wheelchair.  Pt sent from [place] for eval of sob.  No chest pain.  Sx worse for past few months. Former smoker.  Pt alert.   Hx cirrhosis

## 2018-06-09 ENCOUNTER — Inpatient Hospital Stay: Payer: Medicare Other

## 2018-06-09 LAB — CBC
HCT: 25 % — ABNORMAL LOW (ref 40.0–52.0)
Hemoglobin: 9.1 g/dL — ABNORMAL LOW (ref 13.0–18.0)
MCH: 40.3 pg — AB (ref 26.0–34.0)
MCHC: 36.5 g/dL — AB (ref 32.0–36.0)
MCV: 110.4 fL — ABNORMAL HIGH (ref 80.0–100.0)
PLATELETS: 91 10*3/uL — AB (ref 150–440)
RBC: 2.27 MIL/uL — ABNORMAL LOW (ref 4.40–5.90)
RDW: 13.7 % (ref 11.5–14.5)
WBC: 7.7 10*3/uL (ref 3.8–10.6)

## 2018-06-09 LAB — COMPREHENSIVE METABOLIC PANEL
ALBUMIN: 2.1 g/dL — AB (ref 3.5–5.0)
ALK PHOS: 66 U/L (ref 38–126)
ALT: 11 U/L (ref 0–44)
AST: 35 U/L (ref 15–41)
Anion gap: 8 (ref 5–15)
BILIRUBIN TOTAL: 4.9 mg/dL — AB (ref 0.3–1.2)
BUN: 11 mg/dL (ref 8–23)
CALCIUM: 7.5 mg/dL — AB (ref 8.9–10.3)
CO2: 31 mmol/L (ref 22–32)
CREATININE: 0.98 mg/dL (ref 0.61–1.24)
Chloride: 100 mmol/L (ref 98–111)
GFR calc Af Amer: >60 mL/min (ref >60)
GFR calc non Af Amer: >60 mL/min (ref >60)
GLUCOSE: 105 mg/dL — AB (ref 70–99)
Potassium: 3.2 mmol/L — ABNORMAL LOW (ref 3.5–5.1)
SODIUM: 139 mmol/L (ref 135–145)
Total Protein: 6.2 g/dL — ABNORMAL LOW (ref 6.5–8.1)

## 2018-06-09 LAB — PHOSPHORUS: Phosphorus: 3.5 mg/dL (ref 2.5–4.6)

## 2018-06-09 LAB — HEMOGLOBIN A1C
Hgb A1c MFr Bld: 3.6 % — ABNORMAL LOW (ref 4.8–5.6)
Mean Plasma Glucose: 56.62 mg/dL

## 2018-06-09 LAB — MAGNESIUM: MAGNESIUM: 1.9 mg/dL (ref 1.7–2.4)

## 2018-06-09 LAB — GLUCOSE, CAPILLARY
GLUCOSE-CAPILLARY: 95 mg/dL (ref 70–99)
GLUCOSE-CAPILLARY: 129 mg/dL — AB (ref 70–99)
GLUCOSE-CAPILLARY: 171 mg/dL — AB (ref 70–99)
Glucose-Capillary: 111 mg/dL — ABNORMAL HIGH (ref 70–99)

## 2018-06-09 MED ORDER — ALBUMIN HUMAN 25 % IV SOLN
12.5000 g | Freq: Two times a day (BID) | INTRAVENOUS | Status: DC
  Administered 2018-06-10 – 2018-06-11 (×3): 12.5 g via INTRAVENOUS
  Filled 2018-06-09 (×6): qty 50

## 2018-06-09 MED ORDER — FOLIC ACID 1 MG PO TABS
1.0000 mg | ORAL_TABLET | Freq: Every day | ORAL | Status: DC
  Administered 2018-06-09 – 2018-06-11 (×3): 1 mg via ORAL
  Filled 2018-06-09 (×3): qty 1

## 2018-06-09 MED ORDER — VITAMIN B-1 100 MG PO TABS
100.0000 mg | ORAL_TABLET | Freq: Every day | ORAL | Status: DC
  Administered 2018-06-10 – 2018-06-11 (×2): 100 mg via ORAL
  Filled 2018-06-09 (×2): qty 1

## 2018-06-09 MED ORDER — FUROSEMIDE 10 MG/ML IJ SOLN
60.0000 mg | Freq: Two times a day (BID) | INTRAMUSCULAR | Status: DC
Start: 2018-06-09 — End: 2018-06-11
  Administered 2018-06-09 – 2018-06-11 (×5): 60 mg via INTRAVENOUS
  Filled 2018-06-09 (×5): qty 8

## 2018-06-09 MED ORDER — OCUVITE-LUTEIN PO CAPS
1.0000 | ORAL_CAPSULE | Freq: Every day | ORAL | Status: DC
  Administered 2018-06-11: 1 via ORAL
  Filled 2018-06-09 (×3): qty 1

## 2018-06-09 MED ORDER — ADULT MULTIVITAMIN W/MINERALS CH
1.0000 | ORAL_TABLET | Freq: Every day | ORAL | Status: DC
  Administered 2018-06-10 – 2018-06-11 (×2): 1 via ORAL
  Filled 2018-06-09 (×2): qty 1

## 2018-06-09 MED ORDER — PREMIER PROTEIN SHAKE
11.0000 [oz_av] | Freq: Two times a day (BID) | ORAL | Status: DC
  Administered 2018-06-10 – 2018-06-11 (×3): 11 [oz_av] via ORAL

## 2018-06-09 MED ORDER — POTASSIUM CHLORIDE CRYS ER 20 MEQ PO TBCR
20.0000 meq | EXTENDED_RELEASE_TABLET | Freq: Once | ORAL | Status: AC
  Administered 2018-06-09: 20 meq via ORAL
  Filled 2018-06-09: qty 1

## 2018-06-09 NOTE — Progress Notes (Signed)
Chaplain received OR and went to patient room and introduce herself. Chaplain ask patient how he was and did he have questions about Advance Directive. Patient was not sure of what I mean,t until I explained it to him. Patient said yes he wanted to complete and AD. Chaplain ask if he was able to fill document out and patient said someone told him we completed the form. Chaplain told patient he or his wife could fill it out and we will return to complete the form with notary and witness. Patient said ok.

## 2018-06-09 NOTE — Progress Notes (Signed)
Initial Nutrition Assessment  DOCUMENTATION CODES:   Obesity unspecified  INTERVENTION:   Premier Protein BID, each supplement provides 160 kcal and 30 grams of protein.   MVI, thiamine, folic acid daily  Ocuvite daily for zinc replacement (provides zinc 18m and copper 285m  Liberalize diet  NUTRITION DIAGNOSIS:   Increased nutrient needs related to chronic illness(Cirrhosis, etoh abuse ) as evidenced by increased estimated needs.  GOAL:   Patient will meet greater than or equal to 90% of their needs  MONITOR:   PO intake, Supplement acceptance, Labs, Weight trends, Skin, I & O's  REASON FOR ASSESSMENT:   Malnutrition Screening Tool    ASSESSMENT:   [age].o. male with a known history of Alcoholic cirrhosis, HTN,  diabetes mellitus presented to the emergency room sent in from [facility] office due to shortness of breath and left pleural effusion and ascites    Met with pt in room today. Pt reports good appetite and oral intake pta but family shaking there heads "no" at bedside. Pt reports that his appetite is good today but he hates the hospital food. Pt did not lunch touch his lunch tray and upset that he got "fake eggs" for breakfast. Pt requesting to be on a regular diet today. Pt does not drink supplements at home; he reports Ensure gives him diarrhea. Pt with chronic diarrhea likely r/t steatorrhea and not from supplements. Pt is willing to try vanilla Premier Protein as he reports chocolate "gives me nightmares". Pt reports a 25llb weight loss in 1 year; states I weighed 253lbs last year. Per chart, pt weighed 235lbs in June 2018 and 231lbs in May 2019. It appears pt with weight stable pta; however, unsure of pt's true admit weight as pt with edema, ascites and pleural effusion. Pt noted to have low folate and zinc in April. RD will order supplements and vitamins to help pt meet his estimated needs. RD will also liberalize diet and monitor cbgs. RD educated pt today  on the importance of adequate protein and calorie intake needed with cirrhosis.   Medications reviewed and include: lasix, insulin, KCl  Labs reviewed: K 3.2(L), P 3.5 wnl, Mg 1.9 wnl Hgb 9.1(L), Hct 25(L), MCV 110.4(H), MCH 40.3(H), MCHC 36.5(H) cbgs- 95, 129 x 24 hrs Folate- 4.6(L)- 4/12 Zinc 51(L)- 4/12  NUTRITION - FOCUSED PHYSICAL EXAM:    Most Recent Value  Orbital Region  No depletion  Upper Arm Region  No depletion  Thoracic and Lumbar Region  No depletion  Buccal Region  No depletion  Temple Region  No depletion  Clavicle Bone Region  No depletion  Clavicle and Acromion Bone Region  No depletion  Scapular Bone Region  No depletion  Dorsal Hand  No depletion  Patellar Region  No depletion  Anterior Thigh Region  No depletion  Posterior Calf Region  No depletion  Edema (RD Assessment)  Severe  Hair  Reviewed  Eyes  Reviewed  Mouth  Reviewed  Skin  Reviewed [[email or web address].Reeks  Nails  Reviewed     Diet Order:   Diet Order            Diet Carb Modified Fluid consistency: Thin; Room service appropriate? Yes  Diet effective now             EDUCATION NEEDS:   Education needs have been addressed  Skin:  Skin Assessment: Reviewed RN Assessment  Last BM:  8/11  Height:   Ht Readings from Last 1 Encounters:  06/08/18  '5\' 9"'  (1.753 m)    Weight:   Wt Readings from Last 1 Encounters:  06/08/18 103.4 kg    Ideal Body Weight:  72.7 kg  BMI:  Body mass index is 33.67 kg/m.  Estimated Nutritional Needs:   Kcal:  2300-2600kcal/day   Protein:  135-155g/day   Fluid:  >1.8L/day or per MD  Koleen Distance MS, RD, LDN Pager #- [phone] Office#- [phone] After Hours Pager: [phone]

## 2018-06-09 NOTE — Progress Notes (Signed)
[place] [facility] [place] NAME: Patrick [name]    MR#:  [id number]  DATE OF BIRTH:  04/25/1946  SUBJECTIVE:  CHIEF COMPLAINT: Patient still short of breath and awaiting for thoracentesis  REVIEW OF SYSTEMS:  CONSTITUTIONAL: No fever, fatigue or weakness.  EYES: No blurred or double vision.  EARS, NOSE, AND THROAT: No tinnitus or ear pain.  RESPIRATORY: No cough, reports shortness of breath with minimal exertion, no wheezing or hemoptysis.  CARDIOVASCULAR: No chest pain, orthopnea, edema.  GASTROINTESTINAL: No nausea, vomiting, diarrhea or abdominal pain.  GENITOURINARY: No dysuria, hematuria.  ENDOCRINE: No polyuria, nocturia,  HEMATOLOGY: No anemia, easy bruising or bleeding SKIN: No rash or lesion. MUSCULOSKELETAL: No joint pain or arthritis.   NEUROLOGIC: No tingling, numbness, weakness.  PSYCHIATRY: No anxiety or depression.   DRUG ALLERGIES:  No Known Allergies  VITALS:  Blood pressure 99/63, pulse 77, temperature 98 F (36.7 C), temperature source Oral, resp. rate (!) 22, height 5\' 9"  (1.753 m), weight 103.4 kg, SpO2 97 %.  PHYSICAL EXAMINATION:  GENERAL:  72 y.o.-year-old patient lying in the bed with no acute distress.  EYES: Pupils equal, round, reactive to light and accommodation. No scleral icterus. Extraocular muscles intact.  HEENT: Head atraumatic, normocephalic. Oropharynx and nasopharynx clear.  NECK:  Supple, no jugular venous distention. No thyroid enlargement, no tenderness.  LUNGS: Diminished breath sounds on the left side, no wheezing, rales,rhonchi or crepitation. No use of accessory muscles of respiration.  CARDIOVASCULAR: S1, S2 normal. No murmurs, rubs, or gallops.  ABDOMEN: Soft, nontender, distended. Bowel sounds present.  EXTREMITIES: Bilateral lower extremity edema, no cyanosis, or clubbing.  NEUROLOGIC: Cranial nerves II through XII are intact.  Globally weak in all extremities. Sensation intact.  Gait not checked.  PSYCHIATRIC: The patient is alert and oriented x 3.  SKIN: No obvious rash, lesion, or ulcer.    LABORATORY PANEL:   CBC Recent Labs  Lab 06/09/18 0453  WBC 7.7  HGB 9.1*  HCT 25.0*  PLT 91*   ------------------------------------------------------------------------------------------------------------------  Chemistries  Recent Labs  Lab 06/09/18 0453  NA 139  K 3.2*  CL 100  CO2 31  GLUCOSE 105*  BUN 11  CREATININE 0.98  CALCIUM 7.5*  MG 1.9  AST 35  ALT 11  ALKPHOS 66  BILITOT 4.9*   ------------------------------------------------------------------------------------------------------------------  Cardiac Enzymes Recent Labs  Lab 06/08/18 1705  TROPONINI 0.03*   ------------------------------------------------------------------------------------------------------------------  RADIOLOGY:  Dg Chest 2 View  Result Date: 06/08/2018 CLINICAL DATA:  Former smoker presenting with acute superimposed upon chronic shortness of breath. EXAM: CHEST - 2 VIEW COMPARISON:  CT chest 06/05/2018. FINDINGS: Cardiac silhouette upper normal in size to mildly enlarged as noted on the CT. Large LEFT pleural effusion and associated dense consolidation in the LEFT LOWER LOBE and lingula, unchanged. Lungs otherwise clear. Pulmonary vascularity normal. No RIGHT pleural effusion. Degenerative changes involving the thoracic spine and remote fracture involving the T10 vertebral body as noted on the CT. IMPRESSION: 1. Large LEFT pleural effusion and associated passive atelectasis and/or pneumonia involving the LEFT LOWER LOBE and lingula, unchanged since the CT 3 days ago. 2. No new abnormalities. Electronically Signed   By: Evangeline Dakin M.D.   On: 06/08/2018 17:32    EKG:   Orders placed or performed during the hospital encounter of 06/08/18  . ED EKG within 10 minutes  . ED EKG within 10 minutes    ASSESSMENT AND PLAN:     *Large left  pleural effusion  shortness of breath likely due to cirrhosis and ascites.   Atrial flutter ultrasound-guided thoracentesis on the left side by radiology today Labs ordered.  Repeat chest x-ray after thoracentesis. IV albumin  *Large ascites secondary to alcoholic cirrhosis.  Ultrasound-guided paracentesis scheduled for tomorrow Labs ordered.   patient on IV Lasix and added Aldactone.   Will need increased Lasix at discharge along with low-sodium diet and fluid restriction.   Would add Aldactone to home medications.  *Alcoholic cirrhosis.  Patient has quit alcohol.  Follows with [facility].  Outpatient AA follow-up  *Hypertension.  Continue home medications  *Hypokalemia and hypomagnesemia.  Replace through  Oral. Potassium 3.2.  Magnesium at 1.9  *Chronic diarrhea.  Imodium as needed.  Mild thrombocytopenia secondary to cirrhosis-platelets at 91,000.  No active bleeding or bruises noticed     All the records are reviewed and case discussed with Care Management/Social Workerr. Management plans discussed with the patient, family and they are in agreement.  CODE STATUS: DNR   TOTAL TIME TAKING CARE OF THIS PATIENT: 36  minutes.   POSSIBLE D/C IN 1-2  DAYS, DEPENDING ON CLINICAL CONDITION.  Note: This dictation was prepared with Dragon dictation along with smaller phrase technology. Any transcriptional errors that result from this process are unintentional.   Nicholes Mango M.D on 06/09/2018 at 3:20 PM  Between 7am to 6pm - Pager - [phone] After 6pm go to [email or web address] - password [email or web address] [facility]  Office  [phone]  CC: Primary care physician; Idelle Crouch, MD

## 2018-06-09 NOTE — Procedures (Signed)
US guided left thoracentesis.  Removed 1.7 liters of dark amber fluid.  Minimal blood loss and no immediate complication.

## 2018-06-10 ENCOUNTER — Inpatient Hospital Stay: Payer: Medicare Other

## 2018-06-10 LAB — BASIC METABOLIC PANEL
Anion gap: 7 (ref 5–15)
BUN: 12 mg/dL (ref 8–23)
CALCIUM: 7.6 mg/dL — AB (ref 8.9–10.3)
CO2: 31 mmol/L (ref 22–32)
CREATININE: 1.12 mg/dL (ref 0.61–1.24)
Chloride: 101 mmol/L (ref 98–111)
GFR calc non Af Amer: >60 mL/min (ref >60)
Glucose, Bld: 97 mg/dL (ref 70–99)
Potassium: 3.6 mmol/L (ref 3.5–5.1)
SODIUM: 139 mmol/L (ref 135–145)

## 2018-06-10 LAB — MAGNESIUM: Magnesium: 1.5 mg/dL — ABNORMAL LOW (ref 1.7–2.4)

## 2018-06-10 LAB — GLUCOSE, CAPILLARY
GLUCOSE-CAPILLARY: 83 mg/dL (ref 70–99)
GLUCOSE-CAPILLARY: 133 mg/dL — AB (ref 70–99)
GLUCOSE-CAPILLARY: 136 mg/dL — AB (ref 70–99)
Glucose-Capillary: 123 mg/dL — ABNORMAL HIGH (ref 70–99)

## 2018-06-10 LAB — PH, BODY FLUID: pH, Body Fluid: 7.7

## 2018-06-10 MED ORDER — MAGNESIUM SULFATE 4 GM/100ML IV SOLN
4.0000 g | Freq: Once | INTRAVENOUS | Status: AC
  Administered 2018-06-10: 4 g via INTRAVENOUS
  Filled 2018-06-10: qty 100

## 2018-06-10 NOTE — Progress Notes (Signed)
Patients urostomy bag came off again the tubing to foley bag got clogged with stool. Bag changed back to ostomy bag. Changed bag a total of 4 times on shift.

## 2018-06-10 NOTE — Procedures (Signed)
Interventional Radiology Procedure Note  Procedure: US guided paracentesis  Complications: None  Estimated Blood Loss: None  Findings: 3.8 L clear, yellow ascites removed via 6 Fr centesis catheter.  Sample sent for labs.  Venetia Night. Kathlene Cote, M.D Pager:  [phone]

## 2018-06-10 NOTE — Progress Notes (Signed)
[facility] [facility] [place] NAME: Patrick [name]    MR#:  [id number]  DATE OF BIRTH:  07-Jan-1946  SUBJECTIVE:  CHIEF COMPLAINT: Patient denies any SOB.  Patient had thoracentesis yesterday.  Up for paracentesis today REVIEW OF SYSTEMS:  CONSTITUTIONAL: No fever, fatigue or weakness.  EYES: No blurred or double vision.  EARS, NOSE, AND THROAT: No tinnitus or ear pain.  RESPIRATORY: No cough, reports shortness of breath with minimal exertion, no wheezing or hemoptysis.  CARDIOVASCULAR: No chest pain, orthopnea, edema.  GASTROINTESTINAL: No nausea, vomiting, diarrhea or abdominal pain.  GENITOURINARY: No dysuria, hematuria.  ENDOCRINE: No polyuria, nocturia,  HEMATOLOGY: No anemia, easy bruising or bleeding SKIN: No rash or lesion. MUSCULOSKELETAL: No joint pain or arthritis.   NEUROLOGIC: No tingling, numbness, weakness.  PSYCHIATRY: No anxiety or depression.   DRUG ALLERGIES:  No Known Allergies  VITALS:  Blood pressure (!) 111/59, pulse 76, temperature 98.1 F (36.7 C), temperature source Oral, resp. rate 20, height 5\' 9"  (1.753 m), weight 103.4 kg, SpO2 100 %.  PHYSICAL EXAMINATION:  GENERAL:  72 y.o.-year-old patient lying in the bed with no acute distress.  EYES: Pupils equal, round, reactive to light and accommodation. No scleral icterus. Extraocular muscles intact.  HEENT: Head atraumatic, normocephalic. Oropharynx and nasopharynx clear.  NECK:  Supple, no jugular venous distention. No thyroid enlargement, no tenderness.  LUNGS: Diminished breath sounds on the left side, no wheezing, rales,rhonchi or crepitation. No use of accessory muscles of respiration.  CARDIOVASCULAR: S1, S2 normal. No murmurs, rubs, or gallops.  ABDOMEN: Soft, nontender, distended. Bowel sounds present.  EXTREMITIES: Bilateral lower extremity edema, no cyanosis, or clubbing.  NEUROLOGIC: Cranial nerves II through XII are intact.  Globally weak in all  extremities. Sensation intact. Gait not checked.  PSYCHIATRIC: The patient is alert and oriented x 3.  SKIN: No obvious rash, lesion, or ulcer.    LABORATORY PANEL:   CBC Recent Labs  Lab 06/09/18 0453  WBC 7.7  HGB 9.1*  HCT 25.0*  PLT 91*   ------------------------------------------------------------------------------------------------------------------  Chemistries  Recent Labs  Lab 06/09/18 0453 06/10/18 0455  NA 139 139  K 3.2* 3.6  CL 100 101  CO2 31 31  GLUCOSE 105* 97  BUN 11 12  CREATININE 0.98 1.12  CALCIUM 7.5* 7.6*  MG 1.9 1.5*  AST 35  --   ALT 11  --   ALKPHOS 66  --   BILITOT 4.9*  --    ------------------------------------------------------------------------------------------------------------------  Cardiac Enzymes Recent Labs  Lab 06/08/18 1705  TROPONINI 0.03*   ------------------------------------------------------------------------------------------------------------------  RADIOLOGY:  Dg Chest 2 View  Result Date: 06/08/2018 CLINICAL DATA:  Former smoker presenting with acute superimposed upon chronic shortness of breath. EXAM: CHEST - 2 VIEW COMPARISON:  CT chest 06/05/2018. FINDINGS: Cardiac silhouette upper normal in size to mildly enlarged as noted on the CT. Large LEFT pleural effusion and associated dense consolidation in the LEFT LOWER LOBE and lingula, unchanged. Lungs otherwise clear. Pulmonary vascularity normal. No RIGHT pleural effusion. Degenerative changes involving the thoracic spine and remote fracture involving the T10 vertebral body as noted on the CT. IMPRESSION: 1. Large LEFT pleural effusion and associated passive atelectasis and/or pneumonia involving the LEFT LOWER LOBE and lingula, unchanged since the CT 3 days ago. 2. No new abnormalities. Electronically Signed   By: Evangeline Dakin M.D.   On: 06/08/2018 17:32   Dg Chest Port 1 View  Result Date: 06/09/2018 CLINICAL DATA:  Status  post left-sided thoracentesis. EXAM:  PORTABLE CHEST 1 VIEW COMPARISON:  Chest radiograph 06/08/2018 FINDINGS: Left pleural effusion has decreased in size slightly. No pneumothorax. Right lung remains clear. Cardiomegaly is unchanged. IMPRESSION: Decreased size of left pleural effusion.  No pneumothorax. Electronically Signed   By: Ulyses Jarred M.D.   On: 06/09/2018 16:35   US Thoracentesis Asp Pleural Space W/img Guide  Result Date: 06/09/2018 INDICATION: 72 year old with left pleural effusion and shortness of breath. EXAM: ULTRASOUND GUIDED LEFT THORACENTESIS MEDICATIONS: None. COMPLICATIONS: None immediate. PROCEDURE: An ultrasound guided thoracentesis was thoroughly discussed with the patient and questions answered. The benefits, risks, alternatives and complications were also discussed. The patient understands and wishes to proceed with the procedure. Written consent was obtained. Ultrasound was performed to localize and mark an adequate pocket of fluid in the left chest. The area was then prepped and draped in the normal sterile fashion. 1% Lidocaine was used for local anesthesia. Under ultrasound guidance a 6 Fr Safe-T-Centesis catheter was introduced. Thoracentesis was performed. The catheter was removed and a dressing applied. FINDINGS: A total of approximately 1.7 L of dark amber colored fluid was removed. Samples were sent to the laboratory as requested by the clinical team. IMPRESSION: Successful ultrasound guided left thoracentesis yielding 1.7 L of pleural fluid. Electronically Signed   By: Markus Daft M.D.   On: 06/09/2018 16:33    EKG:   Orders placed or performed during the hospital encounter of 06/08/18  . ED EKG within 10 minutes  . ED EKG within 10 minutes    ASSESSMENT AND PLAN:     *Large left pleural effusion shortness of breath likely due to cirrhosis and ascites.   Atrial flutter ultrasound-guided thoracentesis on the left side by radiology June 09, 2018.  Patient tolerated procedure well today Labs ordered.   Repeat chest x-ray after thoracentesis-no pneumothorax and decreased left-sided pleural effusion IV albumin  *Large ascites secondary to alcoholic cirrhosis.  Ultrasound-guided paracentesis scheduled for today Labs ordered.   patient on IV Lasix and added Aldactone.   Will need increased Lasix at discharge along with low-sodium diet and fluid restriction.   Would add Aldactone to home medications.  *Alcoholic cirrhosis.  Patient has quit alcohol.  Follows with [facility].  Outpatient AA follow-up  *Hypertension.  Continue home medications  *Hypokalemia and hypomagnesemia.  Replete and recheck Potassium 3.2.  Magnesium at 1.5  *Chronic diarrhea.  Imodium as needed.  Mild thrombocytopenia secondary to cirrhosis-platelets at 91,000.  No active bleeding or bruises noticed     All the records are reviewed and case discussed with Care Management/Social Workerr. Management plans discussed with the patient, family and they are in agreement.  CODE STATUS: DNR   TOTAL TIME TAKING CARE OF THIS PATIENT: 36  minutes.   POSSIBLE D/C IN 1-2  DAYS, DEPENDING ON CLINICAL CONDITION.  Note: This dictation was prepared with Dragon dictation along with smaller phrase technology. Any transcriptional errors that result from this process are unintentional.   Nicholes Mango M.D on 06/10/2018 at 2:57 PM  Between 7am to 6pm - Pager - [phone] After 6pm go to [email or web address] - password [email or web address] [facility]  Office  [phone]  CC: Primary care physician; Idelle Crouch, MD

## 2018-06-11 LAB — BODY FLUID CELL COUNT WITH DIFFERENTIAL
EOS FL: 0 %
Lymphs, Fluid: 26 %
MONOCYTE-MACROPHAGE-SEROUS FLUID: 56 %
Neutrophil Count, Fluid: 18 %
Other Cells, Fluid: 0 %
WBC FLUID: 436 uL

## 2018-06-11 LAB — ALBUMIN, PLEURAL OR PERITONEAL FLUID
Albumin, Fluid: 1.1 g/dL
Albumin, Fluid: <1 g/dL

## 2018-06-11 LAB — GLUCOSE, PLEURAL OR PERITONEAL FLUID: Glucose, Fluid: 118 mg/dL

## 2018-06-11 LAB — GLUCOSE, CAPILLARY
Glucose-Capillary: 82 mg/dL (ref 70–99)
Glucose-Capillary: 137 mg/dL — ABNORMAL HIGH (ref 70–99)

## 2018-06-11 LAB — LACTATE DEHYDROGENASE, PLEURAL OR PERITONEAL FLUID: LD, Fluid: 98 U/L — ABNORMAL HIGH (ref 3–23)

## 2018-06-11 LAB — MAGNESIUM: Magnesium: 2 mg/dL (ref 1.7–2.4)

## 2018-06-11 LAB — CYTOLOGY - NON PAP

## 2018-06-11 MED ORDER — OCUVITE-LUTEIN PO CAPS: 1.0000 | ORAL_CAPSULE | Freq: Every day | ORAL | 0 refills | Status: DC

## 2018-06-11 MED ORDER — SPIRONOLACTONE 25 MG PO TABS: 25.0000 mg | ORAL_TABLET | Freq: Every day | ORAL | Status: DC

## 2018-06-11 MED ORDER — POTASSIUM CHLORIDE CRYS ER 20 MEQ PO TBCR: 20.0000 meq | EXTENDED_RELEASE_TABLET | Freq: Every day | ORAL | 0 refills | Status: AC

## 2018-06-11 MED ORDER — PREMIER PROTEIN SHAKE: 11.0000 [oz_av] | Freq: Two times a day (BID) | ORAL | 0 refills | Status: DC

## 2018-06-11 MED ORDER — SPIRONOLACTONE 25 MG PO TABS: 25.0000 mg | ORAL_TABLET | Freq: Every day | ORAL | 11 refills | Status: DC

## 2018-06-11 MED ORDER — SPIRONOLACTONE 25 MG PO TABS: 25.0000 mg | ORAL_TABLET | Freq: Every day | ORAL | 0 refills | Status: DC

## 2018-06-11 MED ORDER — ADULT MULTIVITAMIN W/MINERALS CH: 1.0000 | ORAL_TABLET | Freq: Every day | ORAL | Status: AC

## 2018-06-11 MED ORDER — THIAMINE HCL 100 MG PO TABS: 100.0000 mg | ORAL_TABLET | Freq: Every day | ORAL | Status: AC

## 2018-06-11 NOTE — Discharge Instructions (Signed)
Follow-up with primary care physician in 2 to 3 days Follow-up with [facility] in 1 week

## 2018-06-11 NOTE — Discharge Summary (Signed)
Patrick [name] at [place] NAME: Patrick [name]    MR#:  [id number]  DATE OF BIRTH:  [birth year]  DATE OF ADMISSION:  06/08/2018 ADMITTING PHYSICIAN: Hillary Bow, MD  DATE OF DISCHARGE:  06/11/18   PRIMARY CARE PHYSICIAN: Idelle Crouch, MD    ADMISSION DIAGNOSIS:  Shortness of breath [R06.02] Pleural effusion on left [J90] Ascites [R18.8]  DISCHARGE DIAGNOSIS:  Active Problems:   Pleural effusion on left Liver cirrhosis with ascites  SECONDARY DIAGNOSIS:   Past Medical History:  Diagnosis Date  . ASCVD (arteriosclerotic cardiovascular disease)   . Chronic pulmonary hypertension ([place])   . Diabetes mellitus without complication ([place])   . GERD (gastroesophageal reflux disease)   . Heart attack ([place]) 1989  . Heart disease   . Hyperlipidemia   . Hypertension   . IDA (iron deficiency anemia)   . Moderate mitral insufficiency   . Moderate tricuspid insufficiency   . OA (osteoarthritis)   . Paroxysmal A-fib ([place])   . Thrombocytosis ([place])   . Tremor     HOSPITAL COURSE:  HPI  Patrick [name]  is a 72 y.o. male with a known history of Alcoholic cirrhosis, HTN,  diabetes mellitus presented to the emergency room sent in from [facility] GI office due to shortness of breath and left pleural effusion.  Here patient saturations are 92% on room air but has shortness of breath.  Recent CT scan of the abdomen chest showed large left pleural effusion and large amount of ascites.  He does have alcoholic cirrhosis.  Quit alcohol 2 months back. No bleeding.  Nonproductive cough.  Does have chronic lower committee edema.  Has chronic diarrhea.  Uses Imodium as needed.  *Large left pleural effusion shortness of breath likely due to cirrhosis and ascites.  Atrial flutter ultrasound-guided thoracentesis on the left side by radiology June 09, 2018.  Patient tolerated procedure well todayLabs ordered. Repeat chest x-ray after  thoracentesis-no pneumothorax and decreased left-sided pleural effusion IV albumin given  *Large ascites secondary to alcoholic cirrhosis. Ultrasound-guided paracentesis done on June 10, 2018.  patient was given IV Lasix and added Aldactone.  Discharge home with p.o. Lasix and add Aldactone to the regimen Continue potassium supplements but check potassium during follow-up visit  *Alcoholic cirrhosis. Patient has quit alcohol. Follows with [facility] clinic GI.  Outpatient AA follow-up  *Hypertension. Continue home medications  *Hypokalemia and hypomagnesemia.  Repleted  Potassium 3.6.  Magnesium at 2  *Chronic diarrhea. Imodium as needed.  Mild thrombocytopenia secondary to cirrhosis-platelets at 91,000.  No active bleeding or bruises noticed  DISCHARGE CONDITIONS:   Stable  CONSULTS OBTAINED:     PROCEDURES thoracocentesis and paracentesis  DRUG ALLERGIES:  No Known Allergies  DISCHARGE MEDICATIONS:   Allergies as of 06/11/2018   No Known Allergies     Medication List    STOP taking these medications   hydrochlorothiazide 25 MG tablet Commonly known as:  HYDRODIURIL     TAKE these medications   acetaminophen 500 MG tablet Commonly known as:  TYLENOL Take 500 mg by mouth every 8 (eight) hours as needed.   atenolol 25 MG tablet Commonly known as:  TENORMIN Take 25 mg by mouth 2 (two) times daily.   folic acid 1 MG tablet Commonly known as:  FOLVITE Take 1 tablet (1 mg total) by mouth daily.   furosemide 40 MG tablet Commonly known as:  LASIX Take 1 tablet by mouth daily.   JANUMET 50-500 MG  tablet Generic drug:  sitaGLIPtin-metformin Take 1 tablet by mouth daily.   multivitamin with minerals Tabs tablet Take 1 tablet by mouth daily. Start taking on:  06/12/2018   multivitamin-lutein Caps capsule Take 1 capsule by mouth daily. Start taking on:  06/12/2018   potassium chloride SA 20 MEQ tablet Commonly known as:  K-DUR,KLOR-CON Take  1 tablet (20 mEq total) by mouth daily. What changed:  when to take this   protein supplement shake Liqd Commonly known as:  PREMIER PROTEIN Take 325 mLs (11 oz total) by mouth 2 (two) times daily between meals. Start taking on:  06/12/2018   QUALAQUIN 324 MG capsule Generic drug:  quiNINE Take 324 mg by mouth daily as needed.   simvastatin 40 MG tablet Commonly known as:  ZOCOR Take 40 mg by mouth daily.   spironolactone 25 MG tablet Commonly known as:  ALDACTONE Take 1 tablet (25 mg total) by mouth daily.   thiamine 100 MG tablet Take 1 tablet (100 mg total) by mouth daily. Start taking on:  06/12/2018        DISCHARGE INSTRUCTIONS:   Follow-up with primary care physician in 2 to 3 days.  Please check potassium level as Aldactone is added to the regimen Follow-up with [facility] in 1 week   DIET:  Low sodium diet  DISCHARGE CONDITION:  Stable  ACTIVITY:  Activity as tolerated  OXYGEN:  Home Oxygen: No.   Oxygen Delivery: room air  DISCHARGE LOCATION:  home   If you experience worsening of your admission symptoms, develop shortness of breath, life threatening emergency, suicidal or homicidal thoughts you must seek medical attention immediately by calling 911 or calling your MD immediately  if symptoms less severe.  You Must read complete instructions/literature along with all the possible adverse reactions/side effects for all the Medicines you take and that have been prescribed to you. Take any new Medicines after you have completely understood and accpet all the possible adverse reactions/side effects.   Please note  You were cared for by a hospitalist during your hospital stay. If you have any questions about your discharge medications or the care you received while you were in the hospital after you are discharged, you can call the unit and asked to speak with the hospitalist on call if the hospitalist that took care of you is not available. Once  you are discharged, your primary care physician will handle any further medical issues. Please note that NO REFILLS for any discharge medications will be authorized once you are discharged, as it is imperative that you return to your primary care physician (or establish a relationship with a primary care physician if you do not have one) for your aftercare needs so that they can reassess your need for medications and monitor your lab values.     Today  Chief Complaint  Patient presents with  . Shortness of Breath   Patient is doing fine.  Tolerated thoracocentesis and paracentesis well.  Shortness of breath significantly improved and feels comfortable to go home  ROS:  CONSTITUTIONAL: Denies fevers, chills. Denies any fatigue, weakness.  EYES: Denies blurry vision, double vision, eye pain. EARS, NOSE, THROAT: Denies tinnitus, ear pain, hearing loss. RESPIRATORY: Denies cough, wheeze, shortness of breath.  CARDIOVASCULAR: Denies chest pain, palpitations, edema.  GASTROINTESTINAL: Denies nausea, vomiting, diarrhea, abdominal pain. Denies bright red blood per rectum. GENITOURINARY: Denies dysuria, hematuria. ENDOCRINE: Denies nocturia or thyroid problems. HEMATOLOGIC AND LYMPHATIC: Denies easy bruising or bleeding. SKIN: Denies  rash or lesion. MUSCULOSKELETAL: Denies pain in neck, back, shoulder, knees, hips or arthritic symptoms.  NEUROLOGIC: Denies paralysis, paresthesias.  PSYCHIATRIC: Denies anxiety or depressive symptoms.   VITAL SIGNS:  Blood pressure 115/71, pulse 69, temperature 97.9 F (36.6 C), temperature source Oral, resp. rate 19, height 5\' 9"  (1.753 m), weight 103.4 kg, SpO2 96 %.  I/O:    Intake/Output Summary (Last 24 hours) at 06/11/2018 1412 Last data filed at 06/11/2018 1200 Gross per 24 hour  Intake 480 ml  Output 2650 ml  Net -2170 ml    PHYSICAL EXAMINATION:  GENERAL:  72 y.o.-year-old patient lying in the bed with no acute distress.  EYES: Pupils equal,  round, reactive to light and accommodation. No scleral icterus. Extraocular muscles intact.  HEENT: Head atraumatic, normocephalic. Oropharynx and nasopharynx clear.  NECK:  Supple, no jugular venous distention. No thyroid enlargement, no tenderness.  LUNGS: Normal breath sounds bilaterally, no wheezing, rales,rhonchi or crepitation. No use of accessory muscles of respiration.  CARDIOVASCULAR: S1, S2 normal. No murmurs, rubs, or gallops.  ABDOMEN: Soft, non-tender, non-distended. Bowel sounds EXTREMITIES: No pedal edema, cyanosis, or clubbing.  NEUROLOGIC: Cranial nerves II through XII are intact. Sensation intact. Gait not checked.  PSYCHIATRIC: The patient is alert and oriented x 3.  SKIN: No obvious rash, lesion, or ulcer.   DATA REVIEW:   CBC Recent Labs  Lab 06/09/18 0453  WBC 7.7  HGB 9.1*  HCT 25.0*  PLT 91*    Chemistries  Recent Labs  Lab 06/09/18 0453 06/10/18 0455 06/11/18 0530  NA 139 139  --   K 3.2* 3.6  --   CL 100 101  --   CO2 31 31  --   GLUCOSE 105* 97  --   BUN 11 12  --   CREATININE 0.98 1.12  --   CALCIUM 7.5* 7.6*  --   MG 1.9 1.5* 2.0  AST 35  --   --   ALT 11  --   --   ALKPHOS 66  --   --   BILITOT 4.9*  --   --     Cardiac Enzymes Recent Labs  Lab 06/08/18 1705  TROPONINI 0.03*    Microbiology Results  Results for orders placed or performed during the hospital encounter of 06/08/18  Body fluid culture     Status: None (Preliminary result)   Collection Time: 06/09/18  4:11 PM  Result Value Ref Range Status   Specimen Description   Final    PLEURAL Performed at [facility], [place] [address], [place], [place] [address]    Special Requests   Final    NONE Performed at [facility], [place] [place], [place] [address]    Gram Stain   Final    FEW WBC PRESENT, PREDOMINANTLY MONONUCLEAR NO ORGANISMS SEEN    Culture   Final    NO GROWTH 2 DAYS Performed at [facility], [place] [address],  [place], [place] [address]    Report Status PENDING  Incomplete  Body fluid culture     Status: None (Preliminary result)   Collection Time: 06/10/18  3:05 PM  Result Value Ref Range Status   Specimen Description ASCITIC  Final   Special Requests ASCITIC  Final   Gram Stain   Final    RARE WBC PRESENT,BOTH PMN AND MONONUCLEAR NO ORGANISMS SEEN    Culture   Final    NO GROWTH < 24 HOURS Performed at  [facility], [place] [address], [place], [place] [address]    Report Status PENDING  Incomplete    RADIOLOGY:  Dg Chest 2 View  Result Date: 06/08/2018 CLINICAL DATA:  Former smoker presenting with acute superimposed upon chronic shortness of breath. EXAM: CHEST - 2 VIEW COMPARISON:  CT chest 06/05/2018. FINDINGS: Cardiac silhouette upper normal in size to mildly enlarged as noted on the CT. Large LEFT pleural effusion and associated dense consolidation in the LEFT LOWER LOBE and lingula, unchanged. Lungs otherwise clear. Pulmonary vascularity normal. No RIGHT pleural effusion. Degenerative changes involving the thoracic spine and remote fracture involving the T10 vertebral body as noted on the CT. IMPRESSION: 1. Large LEFT pleural effusion and associated passive atelectasis and/or pneumonia involving the LEFT LOWER LOBE and lingula, unchanged since the CT 3 days ago. 2. No new abnormalities. Electronically Signed   By: Evangeline Dakin M.D.   On: 06/08/2018 17:32   US Paracentesis  Result Date: 06/10/2018 INDICATION: Cirrhosis and ascites. EXAM: ULTRASOUND GUIDED PARACENTESIS MEDICATIONS: None. COMPLICATIONS: None immediate. PROCEDURE: Informed written consent was obtained from the patient after a discussion of the risks, benefits and alternatives to treatment. A timeout was performed prior to the initiation of the procedure. Initial ultrasound was performed to localize ascites. The right lower abdomen was prepped and draped in the usual sterile fashion. 1% lidocaine was used for local anesthesia.  Following this, a 6 Fr Safe-T-Centesis catheter was introduced. An ultrasound image was saved for documentation purposes. The paracentesis was performed. The catheter was removed and a dressing was applied. The patient tolerated the procedure well without immediate post procedural complication. FINDINGS: A total of approximately 3.8 L of yellowish fluid was removed. Samples were sent to the laboratory as requested by the clinical team. IMPRESSION: Successful ultrasound-guided paracentesis yielding 3.8 liters of peritoneal fluid. Electronically Signed   By: Aletta Edouard M.D.   On: 06/10/2018 15:48   Dg Chest Port 1 View  Result Date: 06/09/2018 CLINICAL DATA:  Status post left-sided thoracentesis. EXAM: PORTABLE CHEST 1 VIEW COMPARISON:  Chest radiograph 06/08/2018 FINDINGS: Left pleural effusion has decreased in size slightly. No pneumothorax. Right lung remains clear. Cardiomegaly is unchanged. IMPRESSION: Decreased size of left pleural effusion.  No pneumothorax. Electronically Signed   By: Ulyses Jarred M.D.   On: 06/09/2018 16:35   US Thoracentesis Asp Pleural Space W/img Guide  Result Date: 06/09/2018 INDICATION: 72 year old with left pleural effusion and shortness of breath. EXAM: ULTRASOUND GUIDED LEFT THORACENTESIS MEDICATIONS: None. COMPLICATIONS: None immediate. PROCEDURE: An ultrasound guided thoracentesis was thoroughly discussed with the patient and questions answered. The benefits, risks, alternatives and complications were also discussed. The patient understands and wishes to proceed with the procedure. Written consent was obtained. Ultrasound was performed to localize and mark an adequate pocket of fluid in the left chest. The area was then prepped and draped in the normal sterile fashion. 1% Lidocaine was used for local anesthesia. Under ultrasound guidance a 6 Fr Safe-T-Centesis catheter was introduced. Thoracentesis was performed. The catheter was removed and a dressing applied.  FINDINGS: A total of approximately 1.7 L of dark amber colored fluid was removed. Samples were sent to the laboratory as requested by the clinical team. IMPRESSION: Successful ultrasound guided left thoracentesis yielding 1.7 L of pleural fluid. Electronically Signed   By: Markus Daft M.D.   On: 06/09/2018 16:33    EKG:   Orders placed or performed during the hospital encounter of 06/08/18  . ED EKG within 10  minutes  . ED EKG within 10 minutes      Management plans discussed with the patient, family and they are in agreement.  CODE STATUS:     Code Status Orders  (From admission, onward)         Start     Ordered   06/08/18 1942  Do not attempt resuscitation (DNR)  Continuous    Question Answer Comment  In the event of cardiac or respiratory ARREST Do not call a "code blue"   In the event of cardiac or respiratory ARREST Do not perform Intubation, CPR, defibrillation or ACLS   In the event of cardiac or respiratory ARREST Use medication by any route, position, wound care, and other measures to relive pain and suffering. May use oxygen, suction and manual treatment of airway obstruction as needed for comfort.      06/08/18 1941        Code Status History    Date Active Date Inactive Code Status Order ID Comments User Context   06/08/2018 1920 06/08/2018 1941 Full Code [id number]  Hillary Bow, MD ED      TOTAL TIME TAKING CARE OF THIS PATIENT:  43  minutes.   Note: This dictation was prepared with Dragon dictation along with smaller phrase technology. Any transcriptional errors that result from this process are unintentional.   @[facility] @  on 06/11/2018 at 2:12 PM  Between 7am to 6pm - Pager - [phone]  After 6pm go to [email or web address] - password [email or web address] [facility]  Office  [phone]  CC: Primary care physician; Idelle Crouch, MD

## 2018-06-11 NOTE — Care Management (Signed)
Reported that patient ambulated with nursing staff and maintained oxygen levels on RA.  No RNCM needs reported.

## 2018-06-11 NOTE — Care Management Important Message (Signed)
Copy of signed IM left with patient in room.  

## 2018-06-12 LAB — CYTOLOGY - NON PAP

## 2018-06-13 LAB — BODY FLUID CULTURE: CULTURE: NO GROWTH

## 2018-06-14 LAB — BODY FLUID CULTURE: Culture: NO GROWTH

## 2018-07-22 ENCOUNTER — Other Ambulatory Visit: Payer: Self-pay | Admitting: Internal Medicine

## 2018-09-14 ENCOUNTER — Inpatient Hospital Stay: Payer: Medicare Other | Admitting: Internal Medicine

## 2018-09-14 ENCOUNTER — Inpatient Hospital Stay: Payer: Medicare Other

## 2018-10-02 ENCOUNTER — Inpatient Hospital Stay: Payer: Medicare Other | Attending: Internal Medicine | Admitting: Internal Medicine

## 2018-10-02 ENCOUNTER — Other Ambulatory Visit: Payer: Self-pay

## 2018-10-02 ENCOUNTER — Inpatient Hospital Stay: Payer: Medicare Other | Attending: Internal Medicine

## 2018-10-02 ENCOUNTER — Encounter: Payer: Self-pay | Admitting: Internal Medicine

## 2018-10-02 VITALS — BP 110/50 | HR 69 | Temp 98.1°F | Resp 20

## 2018-10-02 DIAGNOSIS — F1010 Alcohol abuse, uncomplicated: Secondary | ICD-10-CM | POA: Insufficient documentation

## 2018-10-02 DIAGNOSIS — D696 Thrombocytopenia, unspecified: Secondary | ICD-10-CM | POA: Diagnosis not present

## 2018-10-02 DIAGNOSIS — Z79899 Other long term (current) drug therapy: Secondary | ICD-10-CM | POA: Insufficient documentation

## 2018-10-02 DIAGNOSIS — Z87891 Personal history of nicotine dependence: Secondary | ICD-10-CM | POA: Diagnosis not present

## 2018-10-02 DIAGNOSIS — D509 Iron deficiency anemia, unspecified: Secondary | ICD-10-CM | POA: Insufficient documentation

## 2018-10-02 DIAGNOSIS — I10 Essential (primary) hypertension: Secondary | ICD-10-CM | POA: Insufficient documentation

## 2018-10-02 DIAGNOSIS — K7460 Unspecified cirrhosis of liver: Secondary | ICD-10-CM

## 2018-10-02 LAB — COMPREHENSIVE METABOLIC PANEL
ALK PHOS: 85 U/L (ref 38–126)
ALT: 15 U/L (ref 0–44)
AST: 31 U/L (ref 15–41)
Albumin: 2.9 g/dL — ABNORMAL LOW (ref 3.5–5.0)
Anion gap: 7 (ref 5–15)
BUN: 23 mg/dL (ref 8–23)
CALCIUM: 9.2 mg/dL (ref 8.9–10.3)
CHLORIDE: 103 mmol/L (ref 98–111)
CO2: 25 mmol/L (ref 22–32)
CREATININE: 1.16 mg/dL (ref 0.61–1.24)
Glucose, Bld: 105 mg/dL — ABNORMAL HIGH (ref 70–99)
Potassium: 4 mmol/L (ref 3.5–5.1)
Sodium: 135 mmol/L (ref 135–145)
Total Bilirubin: 2.1 mg/dL — ABNORMAL HIGH (ref 0.3–1.2)
Total Protein: 7.4 g/dL (ref 6.5–8.1)

## 2018-10-02 LAB — CBC WITH DIFFERENTIAL/PLATELET
Abs Immature Granulocytes: 0.01 10*3/uL (ref 0.00–0.07)
Basophils Absolute: 0.1 10*3/uL (ref 0.0–0.1)
Basophils Relative: 1 %
EOS ABS: 0.3 10*3/uL (ref 0.0–0.5)
EOS PCT: 4 %
HCT: 29.8 % — ABNORMAL LOW (ref 39.0–52.0)
Hemoglobin: 10.8 g/dL — ABNORMAL LOW (ref 13.0–17.0)
Immature Granulocytes: 0 %
LYMPHS PCT: 19 %
Lymphs Abs: 1.3 10*3/uL (ref 0.7–4.0)
MCH: 36.4 pg — AB (ref 26.0–34.0)
MCHC: 36.2 g/dL — AB (ref 30.0–36.0)
MCV: 100.3 fL — ABNORMAL HIGH (ref 80.0–100.0)
MONO ABS: 0.9 10*3/uL (ref 0.1–1.0)
Monocytes Relative: 13 %
Neutro Abs: 4.3 10*3/uL (ref 1.7–7.7)
Neutrophils Relative %: 63 %
Platelets: 93 10*3/uL — ABNORMAL LOW (ref 150–400)
RBC: 2.97 MIL/uL — AB (ref 4.22–5.81)
RDW: 12.9 % (ref 11.5–15.5)
WBC: 6.8 10*3/uL (ref 4.0–10.5)
nRBC: 0 % (ref 0.0–0.2)

## 2018-10-02 LAB — FOLATE: FOLATE: 33 ng/mL (ref >5.9)

## 2018-10-02 NOTE — Progress Notes (Signed)
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  Patient continues to complain of easy bruising; denies any nosebleeds.  Unfortunately continues to drink alcohol.  Continues have shortness of breath on exertion.  Intermittent abdominal pain.  Intermittent leg swelling.  Denies any blood in stools.  Review of Systems  Constitutional: Negative for chills, diaphoresis, fever, malaise/fatigue and weight loss.  HENT: Negative for nosebleeds and sore throat.   Eyes: Negative for double vision.  Respiratory: Positive for shortness of breath. Negative for cough, hemoptysis, sputum production and wheezing.   Cardiovascular: Positive for leg swelling. Negative for chest pain, palpitations and orthopnea.  Gastrointestinal: Negative for abdominal pain, blood in stool, constipation, diarrhea, heartburn, melena, nausea and vomiting.  Genitourinary: Negative for dysuria, frequency and urgency.  Musculoskeletal: Negative for back pain and joint pain.  Skin: Negative.  Negative for itching and rash.  Neurological: Negative for dizziness, tingling, focal weakness, weakness and headaches.  Endo/Heme/Allergies: Bruises/bleeds easily.  Psychiatric/Behavioral: Negative for depression. The patient is not nervous/anxious and does not have insomnia.      PAST MEDICAL HISTORY :  Past Medical History:  Diagnosis Date  . ASCVD (arteriosclerotic cardiovascular disease)   . Chronic pulmonary hypertension ([place])   . Diabetes  mellitus without complication ([place])   . GERD (gastroesophageal reflux disease)   . Heart attack ([place]) 1989  . Heart disease   . Hyperlipidemia   . Hypertension   . IDA (iron deficiency anemia)   . Moderate mitral insufficiency   . Moderate tricuspid insufficiency   . OA (osteoarthritis)   . Paroxysmal A-fib ([place])   . Thrombocytosis ([place])   . Tremor     PAST SURGICAL HISTORY : Past surgical history reviewed.    FAMILY HISTORY :   Family History  Problem Relation Age of Onset  . Heart attack Father     SOCIAL HISTORY:   Social History   Tobacco Use  . Smoking status: Former Smoker    Packs/day: 0.50    Years: 40.00    Pack years: 20.00    Types: Cigarettes    Last attempt to quit: 04/18/1997    Years since quitting: 21.4  . Smokeless tobacco: Never Used  Substance Use Topics  . Alcohol use: Not Currently    Alcohol/week: 50.0 standard drinks    Types: 50 Shots of liquor per week    Comment: "I mix a couple cups whisky/day"  . Drug use: Not Currently    ALLERGIES:  is allergic to aspirin and dextrose.  MEDICATIONS:  Current Outpatient Medications  Medication Sig Dispense Refill  . acetaminophen (TYLENOL) 500 MG tablet Take 500 mg by mouth every 8 (eight) hours as needed.    Marland Kitchen atenolol (TENORMIN) 25 MG tablet Take 25 mg by mouth 2 (two) times daily.     . folic acid (FOLVITE) 1 MG tablet TAKE 1 TABLET BY MOUTH ONCE DAILY 90 tablet 1  . furosemide (LASIX) 40 MG tablet Take 1 tablet by mouth daily.  2  . JANUMET 50-500 MG tablet Take 1  tablet by mouth daily.  10  . Multiple Vitamin (MULTIVITAMIN WITH MINERALS) TABS tablet Take 1 tablet by mouth daily.    . multivitamin-lutein (OCUVITE-LUTEIN) CAPS capsule Take 1 capsule by mouth daily.  0  . potassium chloride SA (K-DUR,KLOR-CON) 20 MEQ tablet Take 1 tablet (20 mEq total) by mouth daily. 30 tablet 0  . protein supplement shake (PREMIER PROTEIN) LIQD Take 325 mLs (11 oz total) by mouth 2 (two) times daily between  meals. 60 Can 0  . simvastatin (ZOCOR) 40 MG tablet Take 40 mg by mouth daily.    Marland Kitchen spironolactone (ALDACTONE) 25 MG tablet Take 1 tablet (25 mg total) by mouth daily. 30 tablet 11  . thiamine 100 MG tablet Take 1 tablet (100 mg total) by mouth daily. 30 tablet   . quiNINE (QUALAQUIN) 324 MG capsule Take 324 mg by mouth daily as needed.     No current facility-administered medications for this visit.     PHYSICAL EXAMINATION:  BP (!) 110/50   Pulse 69   Temp 98.1 F (36.7 C) (Oral)   Resp 20   There were no vitals filed for this visit.  Physical Exam  Constitutional: He is oriented to person, place, and time and well-developed, well-nourished, and in no distress.  Accompanied by his wife.  He is in a wheelchair.  HENT:  Head: Normocephalic and atraumatic.  Mouth/Throat: Oropharynx is clear and moist. No oropharyngeal exudate.  Eyes: Pupils are equal, round, and reactive to light.  Neck: Normal range of motion. Neck supple.  Cardiovascular: Normal rate and regular rhythm.  Pulmonary/Chest: No respiratory distress. He has no wheezes.  Abdominal: Soft. Bowel sounds are normal. He exhibits no distension and no mass. There is no tenderness. There is no rebound and no guarding.  Musculoskeletal: Normal range of motion. He exhibits no tenderness.       Right lower leg: He exhibits edema.       Left lower leg: He exhibits edema.  Neurological: He is alert and oriented to person, place, and time.  Skin: Skin is warm.  Multiple bruises noted.  Psychiatric: Affect normal.     LABORATORY DATA:  I have reviewed the data as listed    Component Value Date/Time   NA 135 10/02/2018 1019   K 4.0 10/02/2018 1019   CL 103 10/02/2018 1019   CO2 25 10/02/2018 1019   GLUCOSE 105 (H) 10/02/2018 1019   BUN 23 10/02/2018 1019   CREATININE 1.16 10/02/2018 1019   CREATININE 0.86 01/25/2015 0856   CALCIUM 9.2 10/02/2018 1019   PROT 7.4 10/02/2018 1019   ALBUMIN 2.9 (L) 10/02/2018 1019   AST  31 10/02/2018 1019   ALT 15 10/02/2018 1019   ALKPHOS 85 10/02/2018 1019   BILITOT 2.1 (H) 10/02/2018 1019   GFRNONAA >60 10/02/2018 1019   GFRNONAA >60 01/25/2015 0856   GFRAA >60 10/02/2018 1019   GFRAA >60 01/25/2015 0856    No results found for: SPEP, UPEP  Lab Results  Component Value Date   WBC 6.8 10/02/2018   NEUTROABS 4.3 10/02/2018   HGB 10.8 (L) 10/02/2018   HCT 29.8 (L) 10/02/2018   MCV 100.3 (H) 10/02/2018   PLT 93 (L) 10/02/2018      Chemistry      Component Value Date/Time   NA 135 10/02/2018 1019   K 4.0 10/02/2018 1019   CL 103 10/02/2018 1019   CO2 25 10/02/2018 1019   BUN 23 10/02/2018 1019   CREATININE 1.16  10/02/2018 1019   CREATININE 0.86 01/25/2015 0856      Component Value Date/Time   CALCIUM 9.2 10/02/2018 1019   ALKPHOS 85 10/02/2018 1019   AST 31 10/02/2018 1019   ALT 15 10/02/2018 1019   BILITOT 2.1 (H) 10/02/2018 1019       RADIOGRAPHIC STUDIES: I have personally reviewed the radiological images as listed and agreed with the findings in the report. No results found.   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  Asymptomatic except for easy bruising [see discussion below] platelets 98.  Stable.  #Mild intermittent macrocytic anemia-secondary to cirrhosis liver disease/Iron deficiency anemia/folate deficiency likely from history of GI bleed/AVMs-9.8.  Continue p.o. iron.  Folic acid.  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  Stable.  #Cirrhosis-decompensated child Pugh B/C-worsened; secondary to alcohol.  Unfortunately poor prognosis.  #Alcohol abuse-patient not interested.  # DISPOSITION: # Follow up in 6 months/labs- cbc/cmp/iron studies/ferritin-Dr.B  Cc; Dr.Sparks    Orders Placed This Encounter  Procedures  . CBC with Differential    Standing Status:   Future    Standing Expiration Date:   10/03/2019  . Ferritin    Standing Status:   Future     Standing Expiration Date:   10/03/2019  . Iron and TIBC    Standing Status:   Future    Standing Expiration Date:   10/03/2019  . Comprehensive metabolic panel    Standing Status:   Future    Standing Expiration Date:   10/03/2019   All questions were answered. The patient knows to call the clinic with any problems, questions or concerns. No barriers to learning was detected.  # 25 minutes face-to-face with the patient discussing the above plan of care; more than 50% of time spent on prognosis/ natural history; counseling and coordination.      Cammie Sickle, MD 10/05/2018 1:22 PM

## 2018-10-02 NOTE — Assessment & Plan Note (Addendum)
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  Asymptomatic except for easy bruising [see discussion below] platelets 98.  Stable.  #Mild intermittent macrocytic anemia-secondary to cirrhosis liver disease/Iron deficiency anemia/folate deficiency likely from history of GI bleed/AVMs-9.8.  Continue p.o. iron.  Folic acid.  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  Stable.  #Cirrhosis-decompensated child Pugh B/C-worsened; secondary to alcohol.  Unfortunately poor prognosis.  #Alcohol abuse-patient not interested.  # DISPOSITION: # Follow up in 6 months/labs- cbc/cmp/iron studies/ferritin-Dr.B  Cc; Dr.Sparks

## 2018-10-26 ENCOUNTER — Other Ambulatory Visit: Payer: Self-pay | Admitting: Internal Medicine

## 2018-10-26 DIAGNOSIS — J90 Pleural effusion, not elsewhere classified: Secondary | ICD-10-CM

## 2018-10-29 ENCOUNTER — Ambulatory Visit
Admission: RE | Admit: 2018-10-29 | Discharge: 2018-10-29 | Disposition: A | Discharge status: Home / Self Care | Payer: Medicare Other | Source: Ambulatory Visit | Attending: Interventional Radiology | Admitting: Interventional Radiology

## 2018-10-29 ENCOUNTER — Ambulatory Visit
Admission: RE | Admit: 2018-10-29 | Discharge: 2018-10-29 | Disposition: A | Discharge status: Home / Self Care | Payer: Medicare Other | Source: Ambulatory Visit | Attending: Internal Medicine | Admitting: Internal Medicine

## 2018-10-29 ENCOUNTER — Telehealth: Payer: Self-pay | Admitting: Interventional Radiology

## 2018-10-29 DIAGNOSIS — Z9889 Other specified postprocedural states: Secondary | ICD-10-CM

## 2018-10-29 DIAGNOSIS — J90 Pleural effusion, not elsewhere classified: Secondary | ICD-10-CM | POA: Diagnosis not present

## 2018-10-29 NOTE — Telephone Encounter (Signed)
Patient states that he feels much improved following large-volume left-sided thoracentesis.  I explained that his postprocedural chest radiograph demonstrated incomplete expansion of the left long secondary to the large amount of chronic left-sided pleural fluid.  I instructed the patient to return to the emergency department if he were to experience acute shortness of breath as this could suggest a worsening pneumothorax.  Patient demonstrated excellent understanding of this conversation and states he feels much improved since undergoing the thoracentesis.  Ronny Bacon, MD Pager #: [phone]

## 2018-10-29 NOTE — Procedures (Signed)
Pre procedural Dx: Symptomatic Pleural effusion Post procedural Dx: Same  Successful US guided left sided thoracentesis yielding 2.2 L of dark red/brown pleural fluid.    EBL: None  Complications: Procedure complicated by development of a small presumably exvacou pneumothorax.   Ronny Bacon, MD Pager #: [phone]

## 2019-01-09 ENCOUNTER — Other Ambulatory Visit: Payer: Self-pay | Admitting: Internal Medicine

## 2019-01-11 NOTE — Telephone Encounter (Signed)
Folate  Order: [id number]  Status:  Final result  Visible to patient:  No (Not Released)  Next appt:  04/07/2019 at 10:30 AM in Oncology ([facility])  Dx:  Thrombocytopenia ([place])   Ref Range & Units 67mo ago 40mo ago  Folate >5.9 ng/mL 33.0  4.6Low  CM  Comment: Performed at [facility], [address] [place],  [address]  Resulting Agency  [facility] CLIN LAB [facility]      Specimen Collected: 10/02/18 10:19  Last Resulted: 10/02/18 13:10

## 2019-03-03 ENCOUNTER — Other Ambulatory Visit: Payer: Self-pay | Admitting: Physician Assistant

## 2019-03-03 DIAGNOSIS — R911 Solitary pulmonary nodule: Secondary | ICD-10-CM

## 2019-03-08 ENCOUNTER — Ambulatory Visit: Payer: Medicare Other

## 2019-03-10 ENCOUNTER — Ambulatory Visit
Admission: RE | Admit: 2019-03-10 | Discharge: 2019-03-10 | Disposition: A | Discharge status: Home / Self Care | Payer: Medicare Other | Source: Ambulatory Visit | Attending: Physician Assistant | Admitting: Physician Assistant

## 2019-03-10 ENCOUNTER — Other Ambulatory Visit: Payer: Self-pay

## 2019-03-10 DIAGNOSIS — R911 Solitary pulmonary nodule: Secondary | ICD-10-CM | POA: Diagnosis present

## 2019-04-07 ENCOUNTER — Ambulatory Visit: Payer: Medicare Other | Admitting: Internal Medicine

## 2019-04-07 ENCOUNTER — Other Ambulatory Visit: Payer: Medicare Other

## 2019-04-28 ENCOUNTER — Inpatient Hospital Stay (HOSPITAL_BASED_OUTPATIENT_CLINIC_OR_DEPARTMENT_OTHER): Payer: Medicare Other | Admitting: Internal Medicine

## 2019-04-28 ENCOUNTER — Encounter: Payer: Self-pay | Admitting: Internal Medicine

## 2019-04-28 ENCOUNTER — Other Ambulatory Visit: Payer: Self-pay | Admitting: *Deleted

## 2019-04-28 ENCOUNTER — Other Ambulatory Visit: Payer: Self-pay

## 2019-04-28 ENCOUNTER — Inpatient Hospital Stay: Payer: Medicare Other | Attending: Internal Medicine

## 2019-04-28 DIAGNOSIS — E119 Type 2 diabetes mellitus without complications: Secondary | ICD-10-CM | POA: Insufficient documentation

## 2019-04-28 DIAGNOSIS — I10 Essential (primary) hypertension: Secondary | ICD-10-CM | POA: Insufficient documentation

## 2019-04-28 DIAGNOSIS — D539 Nutritional anemia, unspecified: Secondary | ICD-10-CM

## 2019-04-28 DIAGNOSIS — Z79899 Other long term (current) drug therapy: Secondary | ICD-10-CM | POA: Diagnosis not present

## 2019-04-28 DIAGNOSIS — R0602 Shortness of breath: Secondary | ICD-10-CM

## 2019-04-28 DIAGNOSIS — D509 Iron deficiency anemia, unspecified: Secondary | ICD-10-CM

## 2019-04-28 DIAGNOSIS — K219 Gastro-esophageal reflux disease without esophagitis: Secondary | ICD-10-CM | POA: Diagnosis not present

## 2019-04-28 DIAGNOSIS — Z87891 Personal history of nicotine dependence: Secondary | ICD-10-CM

## 2019-04-28 DIAGNOSIS — Z8249 Family history of ischemic heart disease and other diseases of the circulatory system: Secondary | ICD-10-CM | POA: Diagnosis not present

## 2019-04-28 DIAGNOSIS — K7460 Unspecified cirrhosis of liver: Secondary | ICD-10-CM | POA: Insufficient documentation

## 2019-04-28 DIAGNOSIS — I480 Paroxysmal atrial fibrillation: Secondary | ICD-10-CM | POA: Insufficient documentation

## 2019-04-28 DIAGNOSIS — R05 Cough: Secondary | ICD-10-CM | POA: Insufficient documentation

## 2019-04-28 DIAGNOSIS — N289 Disorder of kidney and ureter, unspecified: Secondary | ICD-10-CM | POA: Insufficient documentation

## 2019-04-28 DIAGNOSIS — D696 Thrombocytopenia, unspecified: Secondary | ICD-10-CM

## 2019-04-28 DIAGNOSIS — E785 Hyperlipidemia, unspecified: Secondary | ICD-10-CM | POA: Diagnosis not present

## 2019-04-28 DIAGNOSIS — Z7984 Long term (current) use of oral hypoglycemic drugs: Secondary | ICD-10-CM | POA: Insufficient documentation

## 2019-04-28 LAB — CBC WITH DIFFERENTIAL/PLATELET
Abs Immature Granulocytes: 0.06 10*3/uL (ref 0.00–0.07)
Basophils Absolute: 0.1 10*3/uL (ref 0.0–0.1)
Basophils Relative: 1 %
Eosinophils Absolute: 0.3 10*3/uL (ref 0.0–0.5)
Eosinophils Relative: 3 %
HCT: 30.7 % — ABNORMAL LOW (ref 39.0–52.0)
Hemoglobin: 10.9 g/dL — ABNORMAL LOW (ref 13.0–17.0)
Immature Granulocytes: 1 %
Lymphocytes Relative: 13 %
Lymphs Abs: 1.3 10*3/uL (ref 0.7–4.0)
MCH: 34.1 pg — ABNORMAL HIGH (ref 26.0–34.0)
MCHC: 35.5 g/dL (ref 30.0–36.0)
MCV: 95.9 fL (ref 80.0–100.0)
Monocytes Absolute: 1.3 10*3/uL — ABNORMAL HIGH (ref 0.1–1.0)
Monocytes Relative: 13 %
Neutro Abs: 6.9 10*3/uL (ref 1.7–7.7)
Neutrophils Relative %: 69 %
Platelets: 113 10*3/uL — ABNORMAL LOW (ref 150–400)
RBC: 3.2 MIL/uL — ABNORMAL LOW (ref 4.22–5.81)
RDW: 13.6 % (ref 11.5–15.5)
WBC: 9.9 10*3/uL (ref 4.0–10.5)
nRBC: 0 % (ref 0.0–0.2)

## 2019-04-28 LAB — COMPREHENSIVE METABOLIC PANEL
ALT: 11 U/L (ref 0–44)
AST: 26 U/L (ref 15–41)
Albumin: 3 g/dL — ABNORMAL LOW (ref 3.5–5.0)
Alkaline Phosphatase: 77 U/L (ref 38–126)
Anion gap: 9 (ref 5–15)
BUN: 27 mg/dL — ABNORMAL HIGH (ref 8–23)
CO2: 20 mmol/L — ABNORMAL LOW (ref 22–32)
Calcium: 8.7 mg/dL — ABNORMAL LOW (ref 8.9–10.3)
Chloride: 107 mmol/L (ref 98–111)
Creatinine, Ser: 1.62 mg/dL — ABNORMAL HIGH (ref 0.61–1.24)
GFR calc Af Amer: 48 mL/min — ABNORMAL LOW (ref >60)
GFR calc non Af Amer: 41 mL/min — ABNORMAL LOW (ref >60)
Glucose, Bld: 150 mg/dL — ABNORMAL HIGH (ref 70–99)
Potassium: 4 mmol/L (ref 3.5–5.1)
Sodium: 136 mmol/L (ref 135–145)
Total Bilirubin: 1.4 mg/dL — ABNORMAL HIGH (ref 0.3–1.2)
Total Protein: 7.6 g/dL (ref 6.5–8.1)

## 2019-04-28 LAB — FERRITIN: Ferritin: 164 ng/mL (ref 24–336)

## 2019-04-28 LAB — IRON AND TIBC
Iron: 77 ug/dL (ref 45–182)
Saturation Ratios: 25 % (ref 17.9–39.5)
TIBC: 303 ug/dL (ref 250–450)
UIBC: 226 ug/dL

## 2019-04-28 NOTE — Progress Notes (Signed)
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  Patient continues to complain of easy bruising.  Denies any nosebleeds or gum bleeding.  Complains of chronic shortness of breath chronic cough.  Not any worse.  No swelling in the legs.  Denies any blood in stools or black or stools.  Patient stated that he is abstinent from alcohol.  Review of Systems  Constitutional: Negative for chills, diaphoresis, fever, malaise/fatigue and weight loss.  HENT: Negative for nosebleeds and sore throat.   Eyes: Negative for double vision.  Respiratory: Positive for shortness of breath. Negative for cough, hemoptysis, sputum production and wheezing.   Cardiovascular: Positive for leg swelling. Negative for chest pain, palpitations and orthopnea.  Gastrointestinal: Negative for abdominal pain, blood in stool, constipation, diarrhea, heartburn, melena, nausea and vomiting.  Genitourinary: Negative for dysuria, frequency and urgency.  Musculoskeletal: Negative for back pain and joint pain.  Skin: Negative.  Negative for itching and rash.  Neurological: Negative for dizziness, tingling, focal weakness, weakness and headaches.  Endo/Heme/Allergies: Bruises/bleeds easily.  Psychiatric/Behavioral: Negative for depression. The patient is not nervous/anxious and does not have insomnia.      PAST MEDICAL HISTORY :  Past Medical History:  Diagnosis Date  . ASCVD (arteriosclerotic cardiovascular disease)   . Chronic  pulmonary hypertension ([place])   . Diabetes mellitus without complication ([place])   . GERD (gastroesophageal reflux disease)   . Heart attack ([place]) 1989  . Heart disease   . Hyperlipidemia   . Hypertension   . IDA (iron deficiency anemia)   . Moderate mitral insufficiency   . Moderate tricuspid insufficiency   . OA (osteoarthritis)   . Paroxysmal A-fib ([place])   . Thrombocytosis ([place])   . Tremor     PAST SURGICAL HISTORY : Past surgical history reviewed.    FAMILY HISTORY :   Family History  Problem Relation Age of Onset  . Heart attack Father     SOCIAL HISTORY:   Social History   Tobacco Use  . Smoking status: Former Smoker    Packs/day: 0.50    Years: 40.00    Pack years: 20.00    Types: Cigarettes    Quit date: 04/18/1997    Years since quitting: 22.0  . Smokeless tobacco: Never Used  Substance Use Topics  . Alcohol use: Not Currently    Alcohol/week: 50.0 standard drinks    Types: 50 Shots of liquor per week    Comment: "I mix a couple cups whisky/day"  . Drug use: Not Currently    ALLERGIES:  is allergic to aspirin and dextrose.  MEDICATIONS:  Current Outpatient Medications  Medication Sig Dispense Refill  . atenolol (TENORMIN) 25 MG tablet Take 25 mg by mouth 2 (two) times daily.     . folic acid (FOLVITE) 1 MG tablet TAKE 1 TABLET BY MOUTH ONCE DAILY 90 tablet 1  . furosemide (LASIX) 40 MG tablet Take 1 tablet by mouth daily.  2  . JANUMET 50-500 MG tablet Take 1 tablet by mouth daily.  10  .  Multiple Vitamin (MULTIVITAMIN WITH MINERALS) TABS tablet Take 1 tablet by mouth daily.    . multivitamin-lutein (OCUVITE-LUTEIN) CAPS capsule Take 1 capsule by mouth daily.  0  . potassium chloride SA (K-DUR,KLOR-CON) 20 MEQ tablet Take 1 tablet (20 mEq total) by mouth daily. 30 tablet 0  . simvastatin (ZOCOR) 40 MG tablet Take 40 mg by mouth daily.    Marland Kitchen spironolactone (ALDACTONE) 25 MG tablet Take 1 tablet (25 mg total) by mouth daily. 30 tablet 11  . thiamine 100 MG  tablet Take 1 tablet (100 mg total) by mouth daily. 30 tablet   . acetaminophen (TYLENOL) 500 MG tablet Take 500 mg by mouth every 8 (eight) hours as needed.    . protein supplement shake (PREMIER PROTEIN) LIQD Take 325 mLs (11 oz total) by mouth 2 (two) times daily between meals. (Patient not taking: Reported on 04/28/2019) 60 Can 0  . quiNINE (QUALAQUIN) 324 MG capsule Take 324 mg by mouth daily as needed.     No current facility-administered medications for this visit.     PHYSICAL EXAMINATION:  BP 121/70   Pulse 69   Temp 98.9 F (37.2 C) (Tympanic)   Resp 18   There were no vitals filed for this visit.  Physical Exam  Constitutional: He is oriented to person, place, and time and well-developed, well-nourished, and in no distress.  He is alone.  He is in a wheelchair.  HENT:  Head: Normocephalic and atraumatic.  Mouth/Throat: Oropharynx is clear and moist. No oropharyngeal exudate.  Eyes: Pupils are equal, round, and reactive to light.  Neck: Normal range of motion. Neck supple.  Cardiovascular: Normal rate and regular rhythm.  Pulmonary/Chest: No respiratory distress. He has no wheezes.  Abdominal: Soft. Bowel sounds are normal. He exhibits no distension and no mass. There is no abdominal tenderness. There is no rebound and no guarding.  Musculoskeletal: Normal range of motion.        General: No tenderness.     Right lower leg: Edema present.     Left lower leg: Edema present.  Neurological: He is alert and oriented to person, place, and time.  Skin: Skin is warm.  Multiple bruises noted.  Psychiatric: Affect normal.     LABORATORY DATA:  I have reviewed the data as listed    Component Value Date/Time   NA 136 04/28/2019 1358   K 4.0 04/28/2019 1358   CL 107 04/28/2019 1358   CO2 20 (L) 04/28/2019 1358   GLUCOSE 150 (H) 04/28/2019 1358   BUN 27 (H) 04/28/2019 1358   CREATININE 1.62 (H) 04/28/2019 1358   CREATININE 0.86 01/25/2015 0856   CALCIUM 8.7 (L)  04/28/2019 1358   PROT 7.6 04/28/2019 1358   ALBUMIN 3.0 (L) 04/28/2019 1358   AST 26 04/28/2019 1358   ALT 11 04/28/2019 1358   ALKPHOS 77 04/28/2019 1358   BILITOT 1.4 (H) 04/28/2019 1358   GFRNONAA 41 (L) 04/28/2019 1358   GFRNONAA >60 01/25/2015 0856   GFRAA 48 (L) 04/28/2019 1358   GFRAA >60 01/25/2015 0856    No results found for: SPEP, UPEP  Lab Results  Component Value Date   WBC 9.9 04/28/2019   NEUTROABS 6.9 04/28/2019   HGB 10.9 (L) 04/28/2019   HCT 30.7 (L) 04/28/2019   MCV 95.9 04/28/2019   PLT 113 (L) 04/28/2019      Chemistry      Component Value Date/Time   NA 136 04/28/2019 1358   K 4.0 04/28/2019 1358  CL 107 04/28/2019 1358   CO2 20 (L) 04/28/2019 1358   BUN 27 (H) 04/28/2019 1358   CREATININE 1.62 (H) 04/28/2019 1358   CREATININE 0.86 01/25/2015 0856      Component Value Date/Time   CALCIUM 8.7 (L) 04/28/2019 1358   ALKPHOS 77 04/28/2019 1358   AST 26 04/28/2019 1358   ALT 11 04/28/2019 1358   BILITOT 1.4 (H) 04/28/2019 1358       RADIOGRAPHIC STUDIES: I have personally reviewed the radiological images as listed and agreed with the findings in the report. No results found.   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  Asymptomatic except for easy bruising [see discussion below] platelets 113   #Mild intermittent macrocytic anemia-secondary to cirrhosis liver disease/Iron deficiency anemia/folate deficiency likely from history of GI bleed/AVMs-9.8-iron studies are pending at this time.   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  Stable.  #Renal insufficiency creatinine 1.6 baseline around 1.3; likely secondary diuretics.  Defer to cardiology/PCP.  #Cirrhosis-decompensated child Pugh B/C- stable; admits to abstinence.  #  Declines my offer to call his wife.   # DISPOSITION: copy of blood work from today.  # Follow up in 12 months [pt preference] MD/labs- cbc/cmp/iron  studies/ferritin-Dr.B  Cc; Dr.Sparks    No orders of the defined types were placed in this encounter.  All questions were answered. The patient knows to call the clinic with any problems, questions or concerns. No barriers to learning was detected.  # 25 minutes face-to-face with the patient discussing the above plan of care; more than 50% of time spent on prognosis/ natural history; counseling and coordination.      Cammie Sickle, MD 04/28/2019 3:14 PM

## 2019-04-28 NOTE — Assessment & Plan Note (Addendum)
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  Asymptomatic except for easy bruising [see discussion below] platelets 113   #Mild intermittent macrocytic anemia-secondary to cirrhosis liver disease/Iron deficiency anemia/folate deficiency likely from history of GI bleed/AVMs-9.8-iron studies are pending at this time.   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  Stable.  #Renal insufficiency creatinine 1.6 baseline around 1.3; likely secondary diuretics.  Defer to cardiology/PCP.  #Cirrhosis-decompensated child Pugh B/C- stable; admits to abstinence.  #  Declines my offer to call his wife.   # DISPOSITION: copy of blood work from today.  # Follow up in 12 months [pt preference] MD/labs- cbc/cmp/iron studies/ferritin-Dr.B  Cc; Dr.Sparks

## 2019-07-14 DIAGNOSIS — I482 Chronic atrial fibrillation, unspecified: Secondary | ICD-10-CM | POA: Diagnosis present

## 2019-08-08 IMAGING — CR DG CHEST 1V PORT
1 series · 1 of 1 positions shown · non-contrast
Comparison: Chest radiograph-06/16/2018;

CLINICAL DATA: History of cirrhosis now with recurrent symptomatic
left-sided pleural effusion post large volume thoracentesis.

EXAM:
PORTABLE CHEST 1 VIEW

[chest ap]
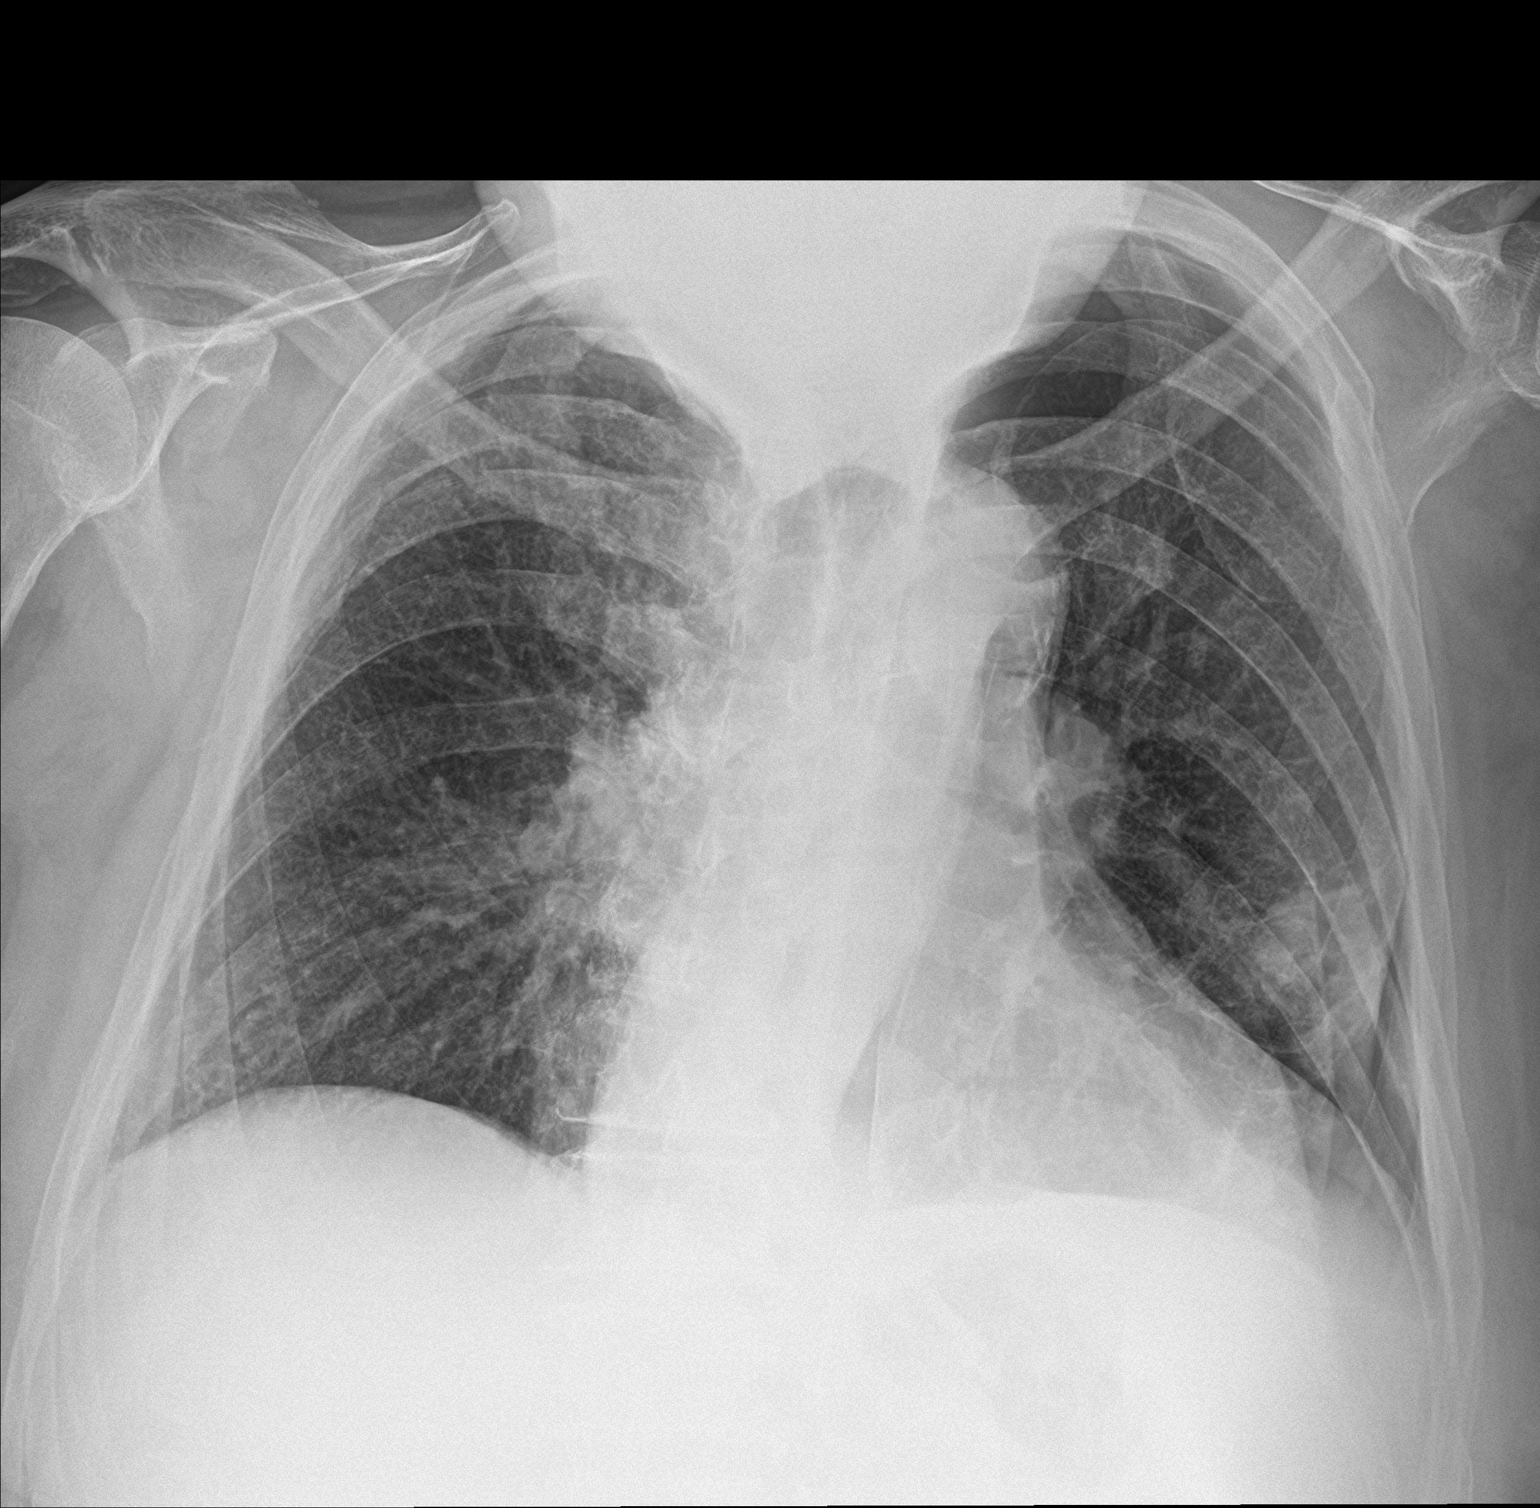

[1 of 1 positions shown; findings below may reference images not displayed]

06/09/2018;
ultrasound-guided left-sided thoracentesis performed 06/09/2018
yielding 1.7 L of pleural fluid.
FINDINGS: Grossly unchanged cardiac silhouette and mediastinal contours with
atherosclerotic plaque within the thoracic aorta.

Interval reduction/near resolution of left-sided effusion post
thoracentesis with development of a small presumably ex vacuo
left-sided hydropneumothorax. No midline shift.

Similar findings of pulmonary venous congestion without frank
evidence of edema. No right-sided pleural effusion. No definite
acute osseous abnormalities.
IMPRESSION: Interval reduction/near resolution of left-sided pleural effusion
post thoracentesis with development of a small presumably ex vacuo
left-sided hydropneumothorax.

## 2019-08-08 IMAGING — US US THORACENTESIS ASP PLEURAL SPACE W/IMG GUIDE
1 series · 4 of 4 positions shown · non-contrast
Comparison: Ultrasound-guided thoracentesis-06/09/2018 yielding
L of pleural fluid

INDICATION: History of cirrhosis now with recurrent symptomatic left-sided
pleural effusion.

EXAM:
US THORACENTESIS ASP PLEURAL SPACE W/IMG GUIDE
TECHNIQUE: Informed written consent was obtained from the patient after a
discussion of the risks, benefits and alternatives to treatment. A
timeout was performed prior to the initiation of the procedure.

[Series 1: us thoracentesis asp pleural space w/img guide · 4 of 4 slices shown]
[im 1/4]
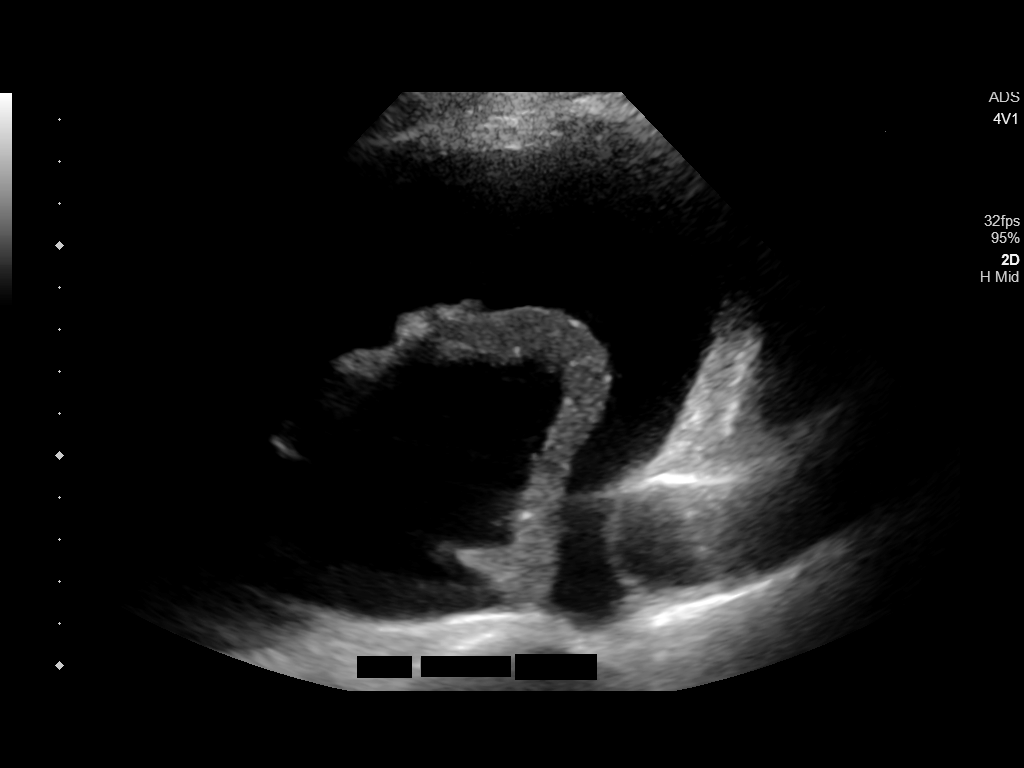
[im 2/4]
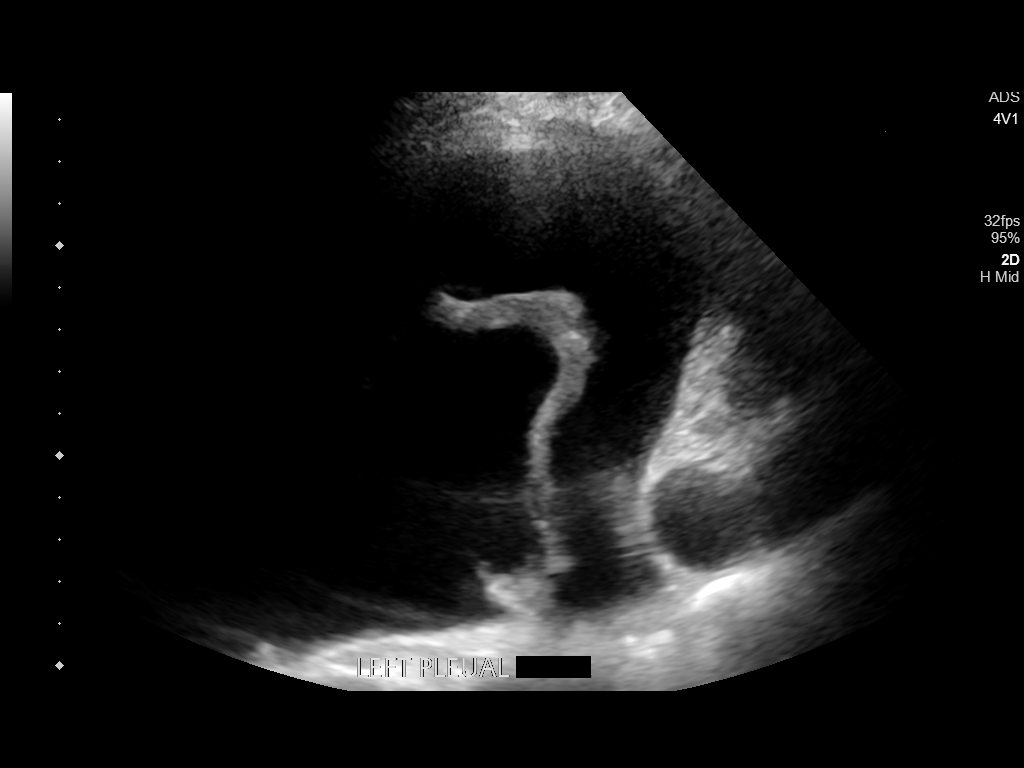
[im 3/4]
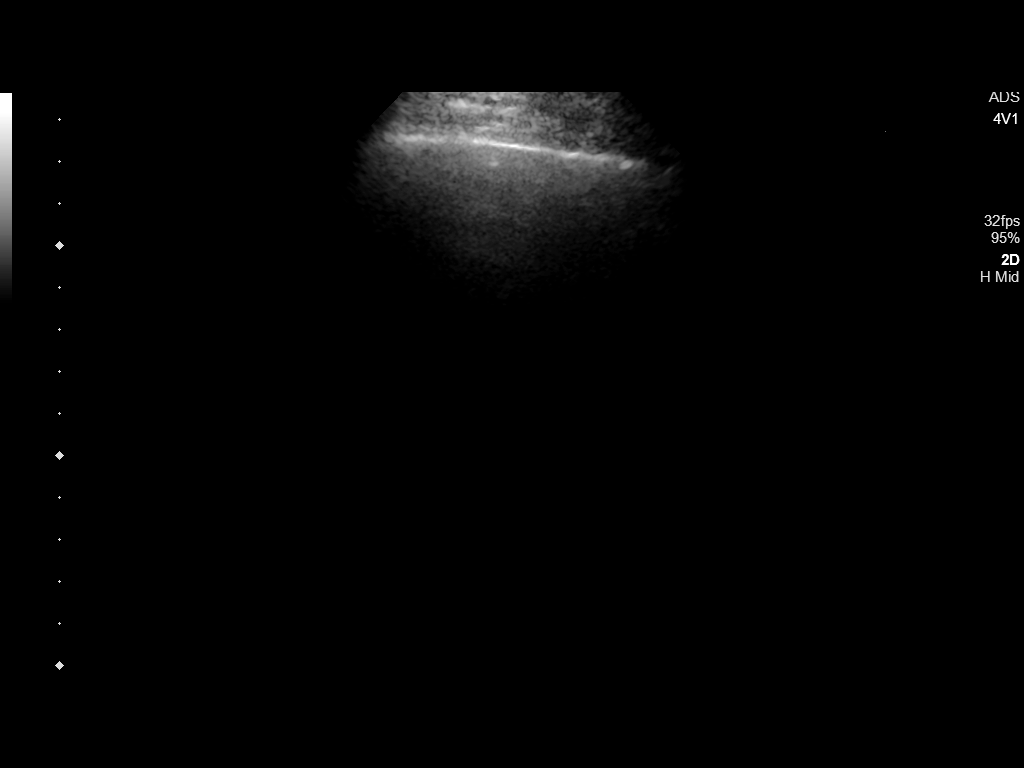
[im 4/4]
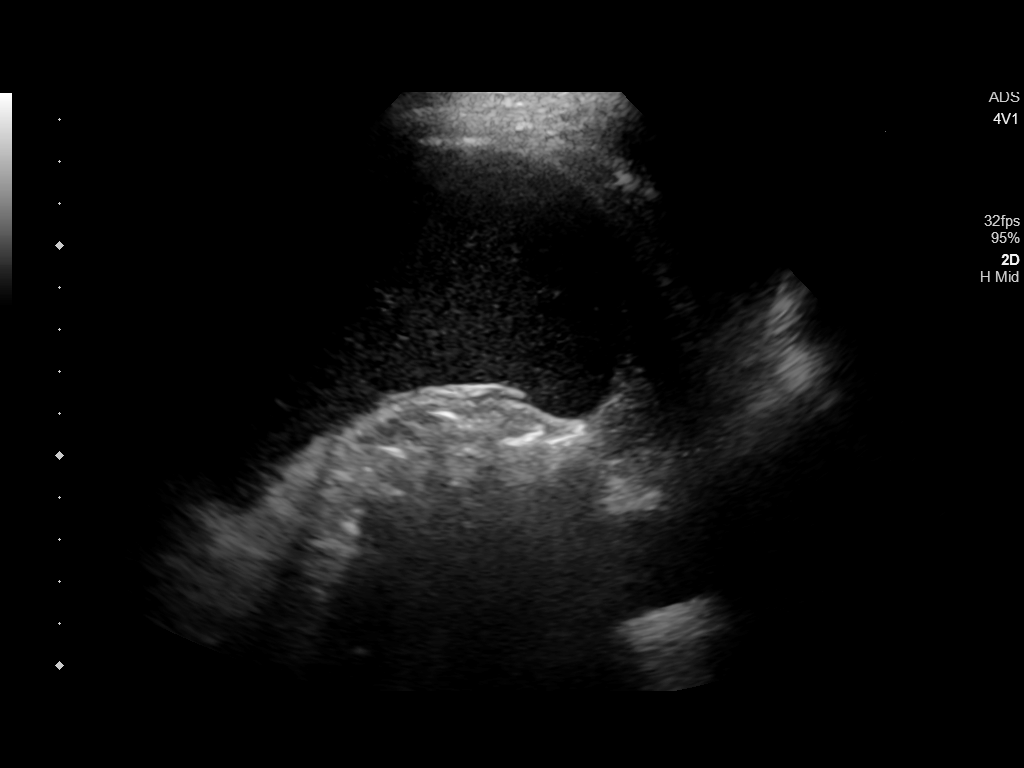

[4 of 4 positions shown; findings below may reference images not displayed]

MEDICATIONS:
None.

COMPLICATIONS:
SIR Level A - No therapy, no consequence.

Large volume left-sided thoracentesis resulted in a small presumably
ex vacuo left-sided pneumothorax.
Initial ultrasound scanning demonstrates a recurrent large anechoic
left-sided pleural effusion. The lower chest was prepped and draped
in the usual sterile fashion. 1% lidocaine was used for local
anesthesia.

An ultrasound image was saved for documentation purposes. An 8 Fr
Safe-T-Centesis catheter was introduced. The thoracentesis was
performed. The catheter was removed and a dressing was applied. The
patient tolerated the procedure well without immediate post
procedural complication. The patient was escorted to have an upright
chest radiograph.
FINDINGS: A total of approximately 2.2 liters of dark red/brown left-sided
pleural fluid was removed.
IMPRESSION: Successful ultrasound-guided left sided thoracentesis yielding
liters of pleural fluid.

## 2019-11-09 ENCOUNTER — Other Ambulatory Visit: Payer: Self-pay | Admitting: Internal Medicine

## 2020-04-28 ENCOUNTER — Other Ambulatory Visit: Payer: Medicare Other

## 2020-04-28 ENCOUNTER — Ambulatory Visit: Payer: Medicare Other | Admitting: Internal Medicine

## 2020-05-04 ENCOUNTER — Other Ambulatory Visit: Payer: Self-pay

## 2020-05-04 DIAGNOSIS — D509 Iron deficiency anemia, unspecified: Secondary | ICD-10-CM

## 2020-05-05 ENCOUNTER — Inpatient Hospital Stay: Payer: Medicare Other | Attending: Internal Medicine

## 2020-05-05 ENCOUNTER — Emergency Department: Payer: Medicare Other

## 2020-05-05 ENCOUNTER — Other Ambulatory Visit: Payer: Self-pay

## 2020-05-05 ENCOUNTER — Inpatient Hospital Stay
Admission: EM | Admit: 2020-05-05 | Discharge: 2020-05-11 | DRG: 377 | Disposition: A | Discharge status: Home Health | Payer: Medicare Other | Attending: Internal Medicine | Admitting: Internal Medicine

## 2020-05-05 ENCOUNTER — Encounter: Payer: Self-pay | Admitting: Family Medicine

## 2020-05-05 ENCOUNTER — Inpatient Hospital Stay (HOSPITAL_BASED_OUTPATIENT_CLINIC_OR_DEPARTMENT_OTHER): Payer: Medicare Other | Admitting: Internal Medicine

## 2020-05-05 DIAGNOSIS — Z87891 Personal history of nicotine dependence: Secondary | ICD-10-CM | POA: Insufficient documentation

## 2020-05-05 DIAGNOSIS — I480 Paroxysmal atrial fibrillation: Secondary | ICD-10-CM | POA: Diagnosis present

## 2020-05-05 DIAGNOSIS — Z79899 Other long term (current) drug therapy: Secondary | ICD-10-CM | POA: Insufficient documentation

## 2020-05-05 DIAGNOSIS — E1142 Type 2 diabetes mellitus with diabetic polyneuropathy: Secondary | ICD-10-CM | POA: Diagnosis present

## 2020-05-05 DIAGNOSIS — N289 Disorder of kidney and ureter, unspecified: Secondary | ICD-10-CM | POA: Insufficient documentation

## 2020-05-05 DIAGNOSIS — K7460 Unspecified cirrhosis of liver: Secondary | ICD-10-CM

## 2020-05-05 DIAGNOSIS — E1169 Type 2 diabetes mellitus with other specified complication: Secondary | ICD-10-CM

## 2020-05-05 DIAGNOSIS — K7290 Hepatic failure, unspecified without coma: Secondary | ICD-10-CM | POA: Diagnosis present

## 2020-05-05 DIAGNOSIS — M189 Osteoarthritis of first carpometacarpal joint, unspecified: Secondary | ICD-10-CM | POA: Diagnosis not present

## 2020-05-05 DIAGNOSIS — L601 Onycholysis: Secondary | ICD-10-CM | POA: Diagnosis present

## 2020-05-05 DIAGNOSIS — D500 Iron deficiency anemia secondary to blood loss (chronic): Secondary | ICD-10-CM | POA: Diagnosis not present

## 2020-05-05 DIAGNOSIS — D509 Iron deficiency anemia, unspecified: Secondary | ICD-10-CM

## 2020-05-05 DIAGNOSIS — I5043 Acute on chronic combined systolic (congestive) and diastolic (congestive) heart failure: Secondary | ICD-10-CM | POA: Diagnosis present

## 2020-05-05 DIAGNOSIS — Z20822 Contact with and (suspected) exposure to covid-19: Secondary | ICD-10-CM | POA: Diagnosis present

## 2020-05-05 DIAGNOSIS — R42 Dizziness and giddiness: Secondary | ICD-10-CM | POA: Insufficient documentation

## 2020-05-05 DIAGNOSIS — E782 Mixed hyperlipidemia: Secondary | ICD-10-CM

## 2020-05-05 DIAGNOSIS — E119 Type 2 diabetes mellitus without complications: Secondary | ICD-10-CM

## 2020-05-05 DIAGNOSIS — J9601 Acute respiratory failure with hypoxia: Secondary | ICD-10-CM | POA: Diagnosis not present

## 2020-05-05 DIAGNOSIS — N179 Acute kidney failure, unspecified: Secondary | ICD-10-CM | POA: Diagnosis present

## 2020-05-05 DIAGNOSIS — E8770 Fluid overload, unspecified: Secondary | ICD-10-CM | POA: Diagnosis not present

## 2020-05-05 DIAGNOSIS — D696 Thrombocytopenia, unspecified: Secondary | ICD-10-CM

## 2020-05-05 DIAGNOSIS — Z7984 Long term (current) use of oral hypoglycemic drugs: Secondary | ICD-10-CM

## 2020-05-05 DIAGNOSIS — R195 Other fecal abnormalities: Secondary | ICD-10-CM | POA: Diagnosis present

## 2020-05-05 DIAGNOSIS — E1151 Type 2 diabetes mellitus with diabetic peripheral angiopathy without gangrene: Secondary | ICD-10-CM | POA: Diagnosis present

## 2020-05-05 DIAGNOSIS — I130 Hypertensive heart and chronic kidney disease with heart failure and stage 1 through stage 4 chronic kidney disease, or unspecified chronic kidney disease: Secondary | ICD-10-CM | POA: Diagnosis present

## 2020-05-05 DIAGNOSIS — I252 Old myocardial infarction: Secondary | ICD-10-CM

## 2020-05-05 DIAGNOSIS — K2101 Gastro-esophageal reflux disease with esophagitis, with bleeding: Secondary | ICD-10-CM

## 2020-05-05 DIAGNOSIS — I272 Pulmonary hypertension, unspecified: Secondary | ICD-10-CM | POA: Diagnosis present

## 2020-05-05 DIAGNOSIS — K219 Gastro-esophageal reflux disease without esophagitis: Secondary | ICD-10-CM | POA: Diagnosis present

## 2020-05-05 DIAGNOSIS — Z888 Allergy status to other drugs, medicaments and biological substances status: Secondary | ICD-10-CM

## 2020-05-05 DIAGNOSIS — D508 Other iron deficiency anemias: Secondary | ICD-10-CM | POA: Diagnosis not present

## 2020-05-05 DIAGNOSIS — K922 Gastrointestinal hemorrhage, unspecified: Secondary | ICD-10-CM | POA: Diagnosis present

## 2020-05-05 DIAGNOSIS — E1122 Type 2 diabetes mellitus with diabetic chronic kidney disease: Secondary | ICD-10-CM | POA: Diagnosis present

## 2020-05-05 DIAGNOSIS — R5383 Other fatigue: Secondary | ICD-10-CM | POA: Insufficient documentation

## 2020-05-05 DIAGNOSIS — E869 Volume depletion, unspecified: Secondary | ICD-10-CM | POA: Diagnosis present

## 2020-05-05 DIAGNOSIS — R0602 Shortness of breath: Secondary | ICD-10-CM | POA: Insufficient documentation

## 2020-05-05 DIAGNOSIS — E785 Hyperlipidemia, unspecified: Secondary | ICD-10-CM | POA: Diagnosis present

## 2020-05-05 DIAGNOSIS — K7031 Alcoholic cirrhosis of liver with ascites: Secondary | ICD-10-CM | POA: Diagnosis present

## 2020-05-05 DIAGNOSIS — I5031 Acute diastolic (congestive) heart failure: Secondary | ICD-10-CM | POA: Diagnosis not present

## 2020-05-05 DIAGNOSIS — N183 Chronic kidney disease, stage 3 unspecified: Secondary | ICD-10-CM | POA: Diagnosis present

## 2020-05-05 DIAGNOSIS — K921 Melena: Secondary | ICD-10-CM

## 2020-05-05 DIAGNOSIS — K5521 Angiodysplasia of colon with hemorrhage: Secondary | ICD-10-CM | POA: Diagnosis present

## 2020-05-05 DIAGNOSIS — I7090 Unspecified atherosclerosis: Secondary | ICD-10-CM | POA: Diagnosis not present

## 2020-05-05 DIAGNOSIS — M1990 Unspecified osteoarthritis, unspecified site: Secondary | ICD-10-CM | POA: Diagnosis present

## 2020-05-05 DIAGNOSIS — J849 Interstitial pulmonary disease, unspecified: Secondary | ICD-10-CM | POA: Insufficient documentation

## 2020-05-05 DIAGNOSIS — D62 Acute posthemorrhagic anemia: Secondary | ICD-10-CM | POA: Diagnosis present

## 2020-05-05 DIAGNOSIS — D649 Anemia, unspecified: Secondary | ICD-10-CM | POA: Diagnosis present

## 2020-05-05 DIAGNOSIS — B351 Tinea unguium: Secondary | ICD-10-CM | POA: Diagnosis present

## 2020-05-05 DIAGNOSIS — I2510 Atherosclerotic heart disease of native coronary artery without angina pectoris: Secondary | ICD-10-CM | POA: Diagnosis present

## 2020-05-05 DIAGNOSIS — Z8249 Family history of ischemic heart disease and other diseases of the circulatory system: Secondary | ICD-10-CM

## 2020-05-05 DIAGNOSIS — M7989 Other specified soft tissue disorders: Secondary | ICD-10-CM | POA: Insufficient documentation

## 2020-05-05 DIAGNOSIS — E538 Deficiency of other specified B group vitamins: Secondary | ICD-10-CM | POA: Insufficient documentation

## 2020-05-05 DIAGNOSIS — Z886 Allergy status to analgesic agent status: Secondary | ICD-10-CM | POA: Insufficient documentation

## 2020-05-05 DIAGNOSIS — R7989 Other specified abnormal findings of blood chemistry: Secondary | ICD-10-CM

## 2020-05-05 DIAGNOSIS — I083 Combined rheumatic disorders of mitral, aortic and tricuspid valves: Secondary | ICD-10-CM | POA: Diagnosis present

## 2020-05-05 DIAGNOSIS — I4891 Unspecified atrial fibrillation: Secondary | ICD-10-CM | POA: Diagnosis not present

## 2020-05-05 LAB — CBC WITH DIFFERENTIAL/PLATELET
Abs Immature Granulocytes: 0.02 10*3/uL (ref 0.00–0.07)
Basophils Absolute: 0 10*3/uL (ref 0.0–0.1)
Basophils Relative: 1 %
Eosinophils Absolute: 0.2 10*3/uL (ref 0.0–0.5)
Eosinophils Relative: 3 %
HCT: 20.2 % — ABNORMAL LOW (ref 39.0–52.0)
Hemoglobin: 6.3 g/dL — ABNORMAL LOW (ref 13.0–17.0)
Immature Granulocytes: 0 %
Lymphocytes Relative: 13 %
Lymphs Abs: 0.6 10*3/uL — ABNORMAL LOW (ref 0.7–4.0)
MCH: 26 pg (ref 26.0–34.0)
MCHC: 31.2 g/dL (ref 30.0–36.0)
MCV: 83.5 fL (ref 80.0–100.0)
Monocytes Absolute: 0.6 10*3/uL (ref 0.1–1.0)
Monocytes Relative: 13 %
Neutro Abs: 3.3 10*3/uL (ref 1.7–7.7)
Neutrophils Relative %: 70 %
Platelets: 101 10*3/uL — ABNORMAL LOW (ref 150–400)
RBC: 2.42 MIL/uL — ABNORMAL LOW (ref 4.22–5.81)
RDW: 17 % — ABNORMAL HIGH (ref 11.5–15.5)
WBC: 4.7 10*3/uL (ref 4.0–10.5)
nRBC: 0 % (ref 0.0–0.2)

## 2020-05-05 LAB — PHOSPHORUS: Phosphorus: 4.1 mg/dL (ref 2.5–4.6)

## 2020-05-05 LAB — COMPREHENSIVE METABOLIC PANEL
ALT: 11 U/L (ref 0–44)
AST: 22 U/L (ref 15–41)
Albumin: 3.1 g/dL — ABNORMAL LOW (ref 3.5–5.0)
Alkaline Phosphatase: 63 U/L (ref 38–126)
Anion gap: 9 (ref 5–15)
BUN: 33 mg/dL — ABNORMAL HIGH (ref 8–23)
CO2: 20 mmol/L — ABNORMAL LOW (ref 22–32)
Calcium: 8.5 mg/dL — ABNORMAL LOW (ref 8.9–10.3)
Chloride: 109 mmol/L (ref 98–111)
Creatinine, Ser: 1.8 mg/dL — ABNORMAL HIGH (ref 0.61–1.24)
GFR calc Af Amer: 42 mL/min — ABNORMAL LOW (ref >60)
GFR calc non Af Amer: 36 mL/min — ABNORMAL LOW (ref >60)
Glucose, Bld: 109 mg/dL — ABNORMAL HIGH (ref 70–99)
Potassium: 4.5 mmol/L (ref 3.5–5.1)
Sodium: 138 mmol/L (ref 135–145)
Total Bilirubin: 1.4 mg/dL — ABNORMAL HIGH (ref 0.3–1.2)
Total Protein: 7.4 g/dL (ref 6.5–8.1)

## 2020-05-05 LAB — IRON AND TIBC
Iron: 21 ug/dL — ABNORMAL LOW (ref 45–182)
Saturation Ratios: 5 % — ABNORMAL LOW (ref 17.9–39.5)
TIBC: 437 ug/dL (ref 250–450)
UIBC: 416 ug/dL

## 2020-05-05 LAB — GLUCOSE, CAPILLARY
Glucose-Capillary: 91 mg/dL (ref 70–99)
Glucose-Capillary: 135 mg/dL — ABNORMAL HIGH (ref 70–99)

## 2020-05-05 LAB — TSH: TSH: 2.701 u[IU]/mL (ref 0.350–4.500)

## 2020-05-05 LAB — SARS CORONAVIRUS 2 BY RT PCR (HOSPITAL ORDER, PERFORMED IN ~~LOC~~ HOSPITAL LAB): SARS Coronavirus 2: NEGATIVE

## 2020-05-05 LAB — ABO/RH: ABO/RH(D): B POS

## 2020-05-05 LAB — PREPARE RBC (CROSSMATCH)

## 2020-05-05 LAB — PROTIME-INR
INR: 1.6 — ABNORMAL HIGH (ref 0.8–1.2)
Prothrombin Time: 18.5 seconds — ABNORMAL HIGH (ref 11.4–15.2)

## 2020-05-05 LAB — FERRITIN: Ferritin: 22 ng/mL — ABNORMAL LOW (ref 24–336)

## 2020-05-05 LAB — MAGNESIUM: Magnesium: 1.9 mg/dL (ref 1.7–2.4)

## 2020-05-05 LAB — CALCIUM: Calcium: 8.7 mg/dL — ABNORMAL LOW (ref 8.9–10.3)

## 2020-05-05 LAB — MRSA PCR SCREENING: MRSA by PCR: NEGATIVE

## 2020-05-05 LAB — BRAIN NATRIURETIC PEPTIDE: B Natriuretic Peptide: 628.7 pg/mL — ABNORMAL HIGH (ref 0.0–100.0)

## 2020-05-05 MED ORDER — SODIUM CHLORIDE 0.9% FLUSH
3.0000 mL | Freq: Two times a day (BID) | INTRAVENOUS | Status: DC
  Administered 2020-05-05 – 2020-05-10 (×8): 3 mL via INTRAVENOUS

## 2020-05-05 MED ORDER — FUROSEMIDE 10 MG/ML IJ SOLN: 20.0000 mg | Freq: Once | INTRAMUSCULAR | Status: DC

## 2020-05-05 MED ORDER — FUROSEMIDE 10 MG/ML IJ SOLN
40.0000 mg | Freq: Once | INTRAMUSCULAR | Status: AC
  Administered 2020-05-05: 40 mg via INTRAVENOUS
  Filled 2020-05-05: qty 4

## 2020-05-05 MED ORDER — SIMVASTATIN 40 MG PO TABS
40.0000 mg | ORAL_TABLET | Freq: Every day | ORAL | Status: DC
  Administered 2020-05-06 – 2020-05-11 (×6): 40 mg via ORAL
  Filled 2020-05-05: qty 2
  Filled 2020-05-05 (×4): qty 1
  Filled 2020-05-05 (×3): qty 2
  Filled 2020-05-05 (×2): qty 1

## 2020-05-05 MED ORDER — HYDROMORPHONE HCL 1 MG/ML IJ SOLN
0.5000 mg | INTRAMUSCULAR | Status: DC | PRN
  Administered 2020-05-09 – 2020-05-10 (×2): 1 mg via INTRAVENOUS
  Filled 2020-05-05 (×2): qty 1

## 2020-05-05 MED ORDER — FUROSEMIDE 20 MG PO TABS: 40.0000 mg | ORAL_TABLET | Freq: Every day | ORAL | Status: DC

## 2020-05-05 MED ORDER — ATENOLOL 25 MG PO TABS: 25.0000 mg | ORAL_TABLET | Freq: Two times a day (BID) | ORAL | Status: DC

## 2020-05-05 MED ORDER — PANTOPRAZOLE SODIUM 40 MG IV SOLR
40.0000 mg | Freq: Once | INTRAVENOUS | Status: AC
  Administered 2020-05-05: 40 mg via INTRAVENOUS
  Filled 2020-05-05: qty 40

## 2020-05-05 MED ORDER — SODIUM CHLORIDE 0.9 % IV SOLN
80.0000 mg | Freq: Once | INTRAVENOUS | Status: DC
  Filled 2020-05-05: qty 80

## 2020-05-05 MED ORDER — SORBITOL 70 % SOLN
30.0000 mL | Freq: Every day | Status: DC | PRN
  Filled 2020-05-05: qty 30

## 2020-05-05 MED ORDER — SODIUM CHLORIDE 0.9% IV SOLUTION: Freq: Once | INTRAVENOUS | Status: DC

## 2020-05-05 MED ORDER — SENNOSIDES-DOCUSATE SODIUM 8.6-50 MG PO TABS
1.0000 | ORAL_TABLET | Freq: Every evening | ORAL | Status: DC | PRN
  Administered 2020-05-11: 11:00:00 1 via ORAL
  Filled 2020-05-05: qty 1

## 2020-05-05 MED ORDER — ATENOLOL 25 MG PO TABS
25.0000 mg | ORAL_TABLET | Freq: Two times a day (BID) | ORAL | Status: DC
  Administered 2020-05-06 – 2020-05-10 (×3): 25 mg via ORAL
  Filled 2020-05-05 (×10): qty 1

## 2020-05-05 MED ORDER — SODIUM CHLORIDE 0.9 % IV SOLN
50.0000 ug/h | INTRAVENOUS | Status: DC
  Administered 2020-05-05 – 2020-05-08 (×7): 50 ug/h via INTRAVENOUS
  Filled 2020-05-05 (×18): qty 1

## 2020-05-05 MED ORDER — LEVALBUTEROL HCL 0.63 MG/3ML IN NEBU
0.6300 mg | INHALATION_SOLUTION | Freq: Four times a day (QID) | RESPIRATORY_TRACT | Status: DC | PRN
  Filled 2020-05-05: qty 3

## 2020-05-05 MED ORDER — SODIUM CHLORIDE 0.9 % IV SOLN
8.0000 mg/h | INTRAVENOUS | Status: DC
  Administered 2020-05-05 – 2020-05-08 (×7): 8 mg/h via INTRAVENOUS
  Filled 2020-05-05 (×8): qty 80

## 2020-05-05 MED ORDER — ALBUTEROL SULFATE (2.5 MG/3ML) 0.083% IN NEBU
2.5000 mg | INHALATION_SOLUTION | RESPIRATORY_TRACT | Status: DC | PRN
  Administered 2020-05-07: 2.5 mg via RESPIRATORY_TRACT
  Filled 2020-05-05: qty 3

## 2020-05-05 MED ORDER — HYDRALAZINE HCL 20 MG/ML IJ SOLN: 10.0000 mg | INTRAMUSCULAR | Status: DC | PRN

## 2020-05-05 MED ORDER — ADULT MULTIVITAMIN W/MINERALS CH
1.0000 | ORAL_TABLET | Freq: Every day | ORAL | Status: DC
  Administered 2020-05-06 – 2020-05-11 (×6): 1 via ORAL
  Filled 2020-05-05 (×6): qty 1

## 2020-05-05 MED ORDER — SODIUM CHLORIDE 0.9 % IV SOLN: 10.0000 mL/h | Freq: Once | INTRAVENOUS | Status: DC

## 2020-05-05 MED ORDER — DOCUSATE SODIUM 100 MG PO CAPS
100.0000 mg | ORAL_CAPSULE | Freq: Two times a day (BID) | ORAL | Status: DC
  Administered 2020-05-05 – 2020-05-11 (×8): 100 mg via ORAL
  Filled 2020-05-05 (×12): qty 1

## 2020-05-05 MED ORDER — SODIUM CHLORIDE 0.9 % IV SOLN
200.0000 mg | Freq: Once | INTRAVENOUS | Status: AC
  Administered 2020-05-05: 200 mg via INTRAVENOUS
  Filled 2020-05-05: qty 10

## 2020-05-05 MED ORDER — SODIUM CHLORIDE 0.9 % IV SOLN
1.0000 g | Freq: Once | INTRAVENOUS | Status: AC
  Administered 2020-05-05: 1 g via INTRAVENOUS
  Filled 2020-05-05: qty 10

## 2020-05-05 MED ORDER — INSULIN ASPART 100 UNIT/ML ~~LOC~~ SOLN
0.0000 [IU] | Freq: Three times a day (TID) | SUBCUTANEOUS | Status: DC
  Administered 2020-05-06 – 2020-05-07 (×2): 1 [IU] via SUBCUTANEOUS
  Administered 2020-05-08: 3 [IU] via SUBCUTANEOUS
  Administered 2020-05-09: 18:00:00 2 [IU] via SUBCUTANEOUS
  Filled 2020-05-05 (×4): qty 1

## 2020-05-05 NOTE — ED Notes (Signed)
Attempted to call report, RN not able to take reports this time. Left number for RN to call back.

## 2020-05-05 NOTE — Progress Notes (Signed)
The blood bank called and more blood was needed prior to being able to allocate blood for transfusion.  Lab here to collect blood at this time.

## 2020-05-05 NOTE — ED Notes (Signed)
Per EDP, give lasix 20 mg after starting blood.

## 2020-05-05 NOTE — ED Provider Notes (Signed)
[facility] Emergency Department Provider Note    First MD Initiated Contact with Patient 05/05/20 1537     (approximate)  I have reviewed the triage vital signs and the nursing notes.   HISTORY  Chief Complaint Anemia    HPI Patrick [name] is a 74 y.o. male significant past medical history as listed below presents to the ER for evaluation of generalized weakness shortness of breath and exertional fatigue.  The symptoms started roughly a week or 2 ago.  States that roughly 1 week ago he was having severe illness with significant malaise was having nausea vomiting and diarrhea with melena.  No hematemesis or coffee-ground emesis.  Since then he has felt very weak and fatigued.  He followed up with Dr. B due to concern for anemia.  Was found to have acute anemia with a hemoglobin of 6 and was sent to the ER for further evaluation.  He does have a history of cirrhosis.  He is not on any anticoagulation or antiplatelet therapy.  Is not having any melena or hematochezia currently.  Does feel short of breath and does not wear home oxygen.  Feels much improved with 2 L nasal cannula.    Past Medical History:  Diagnosis Date  . ASCVD (arteriosclerotic cardiovascular disease)   . Chronic pulmonary hypertension ([place])   . Diabetes mellitus without complication ([place])   . GERD (gastroesophageal reflux disease)   . Heart attack ([place]) 1989  . Heart disease   . Hyperlipidemia   . Hypertension   . IDA (iron deficiency anemia)   . Moderate mitral insufficiency   . Moderate tricuspid insufficiency   . OA (osteoarthritis)   . Paroxysmal A-fib ([place])   . Thrombocytosis ([place])   . Tremor    Family History  Problem Relation Age of Onset  . Heart attack Father    Past Surgical History:  Procedure Laterality Date  . [place]   with stainless steel stent  . CATARACT EXTRACTION    . Closed Reduction Vertebral Process Fracture  1966  . COLONOSCOPY   10/13/2014  . HEMORRHOIDECTOMY WITH HEMORRHOID BANDING    . HERNIA REPAIR     x 2  . KNEE SURGERY     bilateral knee surgery   Patient Active Problem List   Diagnosis Date Noted  . GIB (gastrointestinal bleeding) 05/05/2020  . Pleural effusion on left 06/08/2018  . Thrombocytopenia ([place]) 02/06/2018  . Fecal occult blood test positive 09/08/2014  . Alcohol drinker 09/08/2014  . Absolute anemia 04/15/2014  . 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 Diabetes ([place]) 04/15/2014  . Acid reflux 04/15/2014  . 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 HLD (hyperlipidemia) 04/15/2014  . Arthritis, degenerative 04/15/2014      Prior to Admission medications   Medication Sig Start Date End Date Taking? Authorizing Provider  cyanocobalamin 1000 MCG tablet Take 1,000 mcg by mouth daily at 6 (six) AM.   Yes [provider]  folic acid (FOLVITE) 1 MG tablet TAKE 1 TABLET BY MOUTH ONCE DAILY Patient taking differently: Take 1 mg by mouth daily.  11/09/19  Yes Cammie Sickle, MD  JANUMET 50-1000 MG tablet Take 1 tablet by mouth at bedtime. 04/14/20  Yes [provider]  Multiple Vitamin (MULTIVITAMIN WITH MINERALS) TABS tablet Take 1 tablet by mouth daily. 06/12/18  Yes Gouru, Illene Silver, MD  potassium chloride SA (K-DUR,KLOR-CON) 20 MEQ tablet Take 1 tablet (20 mEq total) by mouth daily. 06/11/18  Yes Gouru, Aruna, MD  simvastatin (ZOCOR) 40 MG tablet Take 40 mg by mouth daily.   Yes [provider]  spironolactone (ALDACTONE) 100 MG tablet Take 50 mg by mouth daily at 6 (six) AM. 03/16/20  Yes [provider]  SYMBICORT 160-4.5 MCG/ACT inhaler Inhale 2 puffs into the lungs 2 (two) times daily. 04/05/20  Yes [provider]  tamsulosin (FLOMAX) 0.4 MG CAPS capsule Take 0.4 mg by mouth daily at 6 (six) AM. 05/18/19 05/17/20 Yes [provider]  thiamine 100 MG tablet Take 1 tablet (100 mg total) by mouth daily. 06/12/18  Yes Gouru, Illene Silver, MD    torsemide (DEMADEX) 20 MG tablet Take 20 mg by mouth 2 (two) times daily. 02/15/20  Yes [provider]  acetaminophen (TYLENOL) 500 MG tablet Take 500 mg by mouth every 8 (eight) hours as needed.    [provider]  furosemide (LASIX) 40 MG tablet Take 1 tablet by mouth daily. Patient not taking: Reported on 05/05/2020 03/26/18   [provider]  nabumetone (RELAFEN) 500 MG tablet Take 500 mg by mouth at bedtime. Patient not taking: Reported on 05/05/2020 04/05/20   [provider]  protein supplement shake (PREMIER PROTEIN) LIQD Take 325 mLs (11 oz total) by mouth 2 (two) times daily between meals. Patient not taking: Reported on 04/28/2019 06/12/18   Nicholes Mango, MD  quiNINE (QUALAQUIN) 324 MG capsule Take 324 mg by mouth daily as needed.    [provider]    Allergies Aspirin and Dextrose    Social History Social History   Tobacco Use  . Smoking status: Former Smoker    Packs/day: 0.50    Years: 40.00    Pack years: 20.00    Types: Cigarettes    Quit date: 04/18/1997    Years since quitting: 23.0  . Smokeless tobacco: Never Used  Vaping Use  . Vaping Use: Never used  Substance Use Topics  . Alcohol use: Not Currently    Alcohol/week: 50.0 standard drinks    Types: 50 Shots of liquor per week    Comment: "I mix a couple cups whisky/day"  . Drug use: Not Currently    Review of Systems Patient denies headaches, rhinorrhea, blurry vision, numbness, shortness of breath, chest pain, edema, cough, abdominal pain, nausea, vomiting, diarrhea, dysuria, fevers, rashes or hallucinations unless otherwise stated above in HPI. ____________________________________________   PHYSICAL EXAM:  VITAL SIGNS: Vitals:   05/05/20 1700 05/05/20 1730  BP: (!) 122/54 (!) 122/59  Pulse: (!) 58 (!) 48  Resp: (!) 0 (!) 22  Temp:    SpO2: 100% 100%    Constitutional: Alert and oriented. Pale appearing Eyes: Conjunctivae are normal.  Head:  Atraumatic. Nose: No congestion/rhinnorhea. Mouth/Throat: Mucous membranes are moist.   Neck: No stridor. Painless ROM.  Cardiovascular: Normal rate, regular rhythm. Grossly normal heart sounds.  Good peripheral circulation. Respiratory: Normal respiratory effort.  No retractions. Lungs with coarse bibasilar breathsounds. Gastrointestinal: Soft and nontender. No distention. No abdominal bruits. No CVA tenderness. Genitourinary: guaiac positive stool Musculoskeletal: No lower extremity tenderness nor edema.  No joint effusions. Neurologic:  Normal speech and language. No gross focal neurologic deficits are appreciated. No facial droop Skin:  Skin is warm, dry and intact. No rash noted. Psychiatric: Mood and affect are normal. Speech and behavior are normal.  ____________________________________________   LABS (all labs ordered are listed, but only abnormal results are displayed)  Results for orders placed or performed during the hospital encounter of  05/05/20 (from the past 24 hour(s))  Type and screen [place]     Status: None   Collection Time: 05/05/20  4:06 PM  Result Value Ref Range   ABO/RH(D) B POS    Antibody Screen NEG    Sample Expiration      05/08/2020,2359 Performed at [facility], [address] [place], [place] [address]   Protime-INR     Status: Abnormal   Collection Time: 05/05/20  4:06 PM  Result Value Ref Range   Prothrombin Time 18.5 (H) 11.4 - 15.2 seconds   INR 1.6 (H) 0.8 - 1.2  SARS Coronavirus 2 by RT PCR (hospital order, performed in [place] hospital lab) Nasopharyngeal Nasopharyngeal Swab     Status: None   Collection Time: 05/05/20  4:23 PM   Specimen: Nasopharyngeal Swab  Result Value Ref Range   SARS Coronavirus 2 NEGATIVE NEGATIVE  Prepare RBC (crossmatch)     Status: None (Preliminary result)   Collection Time: 05/05/20  4:34 PM  Result Value Ref Range   Order Confirmation PENDING   Brain natriuretic  peptide     Status: Abnormal   Collection Time: 05/05/20  5:05 PM  Result Value Ref Range   B Natriuretic Peptide 628.7 (H) 0.0 - 100.0 pg/mL  Glucose, capillary     Status: None   Collection Time: 05/05/20  6:33 PM  Result Value Ref Range   Glucose-Capillary 91 70 - 99 mg/dL   ____________________________________________  EKG My review and personal interpretation at Time: 17:12   Indication: weakness  Rate: 65  Rhythm: afib Axis: normal Other: normal intervals, no stemi ____________________________________________  RADIOLOGY  I personally reviewed all radiographic images ordered to evaluate for the above acute complaints and reviewed radiology reports and findings.  These findings were personally discussed with the patient.  Please see medical record for radiology report.  ____________________________________________   PROCEDURES  Procedure(s) performed:  .Critical Care Performed by: Merlyn Lot, MD Authorized by: Merlyn Lot, MD   Critical care provider statement:    Critical care time (minutes):  40   Critical care time was exclusive of:  Separately billable procedures and treating other patients   Critical care was necessary to treat or prevent imminent or life-threatening deterioration of the following conditions:  Circulatory failure   Critical care was time spent personally by me on the following activities:  Development of treatment plan with patient or surrogate, discussions with consultants, evaluation of patient's response to treatment, examination of patient, obtaining history from patient or surrogate, ordering and performing treatments and interventions, ordering and review of laboratory studies, ordering and review of radiographic studies, pulse oximetry, re-evaluation of patient's condition and review of old charts      Critical Care performed: yes ____________________________________________   INITIAL IMPRESSION / [place] / ED  COURSE  Pertinent labs & imaging results that were available during my care of the patient were reviewed by me and considered in my medical decision making (see chart for details).   DDX: Acute blood loss anemia, PUD, cirrhosis, variceal bleed, CHF, iron deficiency anemia  Christian B Buonocore is a 74 y.o. who presents to the ED with symptoms as described above.  Patient presenting with acute blood loss anemia.  I suspect primary bleeding episode occurred roughly 1 week prior per history but still having guaiac positive stool stool.  I have ordered 2 unit transfusion.  Notified GI agrees with plan for octreotide, Protonix, Rocephin and transfusion.  He  is otherwise hemodynamically stable.  His chest x-ray is concerning for CHF.  Will give Lasix with transfusion.  Have discussed with the patient and available family all diagnostics and treatments performed thus far and all questions were answered to the best of my ability. The patient demonstrates understanding and agreement with plan.      The patient was evaluated in Emergency Department today for the symptoms described in the history of present illness. He/she was evaluated in the context of the global COVID-19 pandemic, which necessitated consideration that the patient might be at risk for infection with the SARS-CoV-2 virus that causes COVID-19. Institutional protocols and algorithms that pertain to the evaluation of patients at risk for COVID-19 are in a state of rapid change based on information released by regulatory bodies including the CDC and federal and state organizations. These policies and algorithms were followed during the patient's care in the ED.  As part of my medical decision making, I reviewed the following data within the [place] notes reviewed and incorporated, Labs reviewed, notes from prior ED visits and [place] Controlled Substance Database   ____________________________________________   FINAL  CLINICAL IMPRESSION(S) / ED DIAGNOSES  Final diagnoses:  Symptomatic anemia  Melena  Hepatic cirrhosis, unspecified hepatic cirrhosis type, unspecified whether ascites present ([place])      NEW MEDICATIONS STARTED DURING THIS VISIT:  Current Discharge Medication List       Note:  This document was prepared using Dragon voice recognition software and may include unintentional dictation errors.    Merlyn Lot, MD 05/05/20 [phone]

## 2020-05-05 NOTE — ED Triage Notes (Signed)
Pt was sent from the cancer center with a hemoglobin of 6, pt states that he is supposed to be admitted and receive a transfusion, states that he is ok with his breathing as long as he is wearing his O2

## 2020-05-05 NOTE — Assessment & Plan Note (Addendum)
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  Asymptomatic except for easy bruising [see discussion below] platelets 60s.   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   #Shortness of breath-worsening; patient asymptomatic.  Suspect fluid overload/decompensated cirrhosis-recommend urgent evaluation in the emergency room.  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  Stable.  # DISPOSITION:  # ER today-  #Follow-up to be decided-Dr.B  Cc; Dr.Sparks

## 2020-05-05 NOTE — H&P (Signed)
History and Physical   Patient: Patrick [name]                            PCP: Idelle Crouch, MD                    DOB: [birth year]            DOA: 05/05/2020 UXN:[id number]             DOS: 05/05/2020, 4:55 PM  Patient coming from:   HOME I have personally reviewed patient's medical records, in electronic medical records, including:  [facility], and care everywhere.    Chief Complaint:   Chief Complaint  Patient presents with   Anemia  Symptomatic anemia  History of present illness:    Patrick [name] is a 74 y.o. male with medical history significant of   iron deficiency anemia, thrombocytopenia, liver cirrhosis/splenomegal, (history of alcohol abuse, tobacco abuse), hypertension, hyperlipidemia, diabetes mellitus type II, GERD, CAD, Presented with generalized weakness, shortness of breath-noted to be severely anemic at his oncology follow-up visit today.  Patient reports that his symptoms started 2 weeks ago with progressive weakness with significant malaise, some nausea vomiting diarrhea and melena.  He had a follow-up with his oncology for continued work-up of iron deficiency anemia.  Noted to be progressively weak, having shortness of breath, anemic with a hemoglobin of 6.0. He seemed to have a history of liver cirrhosis, splenomegaly with remote history of alcohol abuse, previous GI bleed on 05/05/2020 (last colonoscopy per record 10/13/2014) Patient was sent to the ED for further evaluation  Patient Denies having: Fever, Chills, Cough, SOB, Chest Pain, Abd pain, N/V/D, headache, dizziness, lightheadedness,  Dysuria, Joint pain, rash, open wounds    ED evaluation: Found hemodynamically stable with a blood pressure 133/44 pulse of 54, satting 98% on room air CBC WBC 4.7, hemoglobin 6.3, hematocrit 20.2, platelets of 101. CMP within normal limits with exception of BUN of 33 creatinine 1.8, GFR 36 LFTs within normal limits with exception of T bili 1.4, Iron low at  21 TIBC 437 normal percent sat low at 5 ferritin 22, low Hemoccult positive  GI was consulted Patient started on 2 units of PRBC transfusion  Requested for patient to be admitted for further evaluation recommendation    Review of Systems: As per HPI, otherwise 10 point review of systems were negative.   ----------------------------------------------------------------------------------------------------------------------  Allergies  Allergen Reactions   Aspirin Other (See Comments)   Dextrose Other (See Comments)    PATIENT CAN NOT TAKE BLOOD THINNERS OF ANY TYPE     Home MEDs:  Prior to Admission medications   Medication Sig Start Date End Date Taking? Authorizing Provider  acetaminophen (TYLENOL) 500 MG tablet Take 500 mg by mouth every 8 (eight) hours as needed.    [provider]  atenolol (TENORMIN) 25 MG tablet Take 25 mg by mouth 2 (two) times daily.  01/17/15   [provider]  folic acid (FOLVITE) 1 MG tablet TAKE 1 TABLET BY MOUTH ONCE DAILY 11/09/19   Cammie Sickle, MD  furosemide (LASIX) 40 MG tablet Take 1 tablet by mouth daily. Patient not taking: Reported on 05/05/2020 03/26/18   [provider]  JANUMET 50-500 MG tablet Take 1 tablet by mouth daily. 12/29/17   [provider]  Multiple Vitamin (MULTIVITAMIN WITH MINERALS) TABS tablet Take 1 tablet by mouth daily. 06/12/18   Gouru,  Illene Silver, MD  potassium chloride SA (K-DUR,KLOR-CON) 20 MEQ tablet Take 1 tablet (20 mEq total) by mouth daily. 06/11/18   Nicholes Mango, MD  protein supplement shake (PREMIER PROTEIN) LIQD Take 325 mLs (11 oz total) by mouth 2 (two) times daily between meals. Patient not taking: Reported on 04/28/2019 06/12/18   Nicholes Mango, MD  quiNINE (QUALAQUIN) 324 MG capsule Take 324 mg by mouth daily as needed.    [provider]  simvastatin (ZOCOR) 40 MG tablet Take 40 mg by mouth daily.    [provider]  spironolactone (ALDACTONE) 25 MG tablet  Take 1 tablet (25 mg total) by mouth daily. 06/11/18 06/11/19  Nicholes Mango, MD  thiamine 100 MG tablet Take 1 tablet (100 mg total) by mouth daily. 06/12/18   Nicholes Mango, MD    PRN MEDs: albuterol, hydrALAZINE, HYDROmorphone (DILAUDID) injection, levalbuterol, senna-docusate, sorbitol  Past Medical History:  Diagnosis Date   ASCVD (arteriosclerotic cardiovascular disease)    Chronic pulmonary hypertension (HCC)    Diabetes mellitus without complication (HCC)    GERD (gastroesophageal reflux disease)    Heart attack ([place]) 1989   Heart disease    Hyperlipidemia    Hypertension    IDA (iron deficiency anemia)    Moderate mitral insufficiency    Moderate tricuspid insufficiency    OA (osteoarthritis)    Paroxysmal A-fib (HCC)    Thrombocytosis (HCC)    Tremor     Past Surgical History:  Procedure Laterality Date   [place]   with stainless steel stent   CATARACT EXTRACTION     Closed Reduction Vertebral Process Fracture  1966   COLONOSCOPY  10/13/2014   HEMORRHOIDECTOMY WITH HEMORRHOID BANDING     HERNIA REPAIR     x 2   KNEE SURGERY     bilateral knee surgery     reports that he quit smoking about 23 years ago. His smoking use included cigarettes. He has a 20.00 pack-year smoking history. He has never used smokeless tobacco. He reports previous alcohol use of about 50.0 standard drinks of alcohol per week. He reports previous drug use.   Family History  Problem Relation Age of Onset   Heart attack Father     Physical Exam:   Vitals:   05/05/20 1517 05/05/20 1524  BP: (!) 133/44   Pulse: (!) 54   Temp: 97.6 F (36.4 C)   TempSrc: Oral   SpO2: 98%   Weight:  106.6 kg  Height:  5\' 9"  (1.753 m)   Constitutional: NAD, calm, mild discomfort with shortness of breath Eyes: PERRL, lids and conjunctivae normal ENMT: Mucous membranes are moist. Posterior pharynx clear of any exudate or lesions.Normal dentition.  Neck: normal,  supple, no masses, no thyromegaly Respiratory: clear to auscultation bilaterally, no wheezing, no crackles. Normal respiratory effort. No accessory muscle use.  Cardiovascular: Regular rate and rhythm, no murmurs / rubs / gallops. No extremity edema. 2+ pedal pulses. No carotid bruits.  Abdomen: no tenderness, no masses palpated. No hepatosplenomegaly. Bowel sounds positive.  Musculoskeletal: Severe generalized weakness noted no clubbing / cyanosis. No joint deformity upper and lower extremities. Good ROM, no contractures. Normal muscle tone.  Neurologic: CN II-XII grossly intact. Sensation intact, DTR normal. Strength 5/5 in all 4.  Psychiatric: Normal judgment and insight. Alert and oriented x 3. Normal mood.  Skin: no rashes, lesions, ulcers. No induration Wounds: per nursing documentation      Labs on admission:    I have personally  reviewed following labs and imaging studies  CBC: Recent Labs  Lab 05/05/20 1405  WBC 4.7  NEUTROABS 3.3  HGB 6.3*  HCT 20.2*  MCV 83.5  PLT 458*   Basic Metabolic Panel: Recent Labs  Lab 05/05/20 1405  NA 138  K 4.5  CL 109  CO2 20*  GLUCOSE 109*  BUN 33*  CREATININE 1.80*  CALCIUM 8.5*   GFR: Estimated Creatinine Clearance: 43.3 mL/min (A) (by C-G formula based on SCr of 1.8 mg/dL (H)). Liver Function Tests: Recent Labs  Lab 05/05/20 1405  AST 22  ALT 11  ALKPHOS 63  BILITOT 1.4*  PROT 7.4  ALBUMIN 3.1*   No results for input(s): LIPASE, AMYLASE in the last 168 hours. No results for input(s): AMMONIA in the last 168 hours. Coagulation Profile: Recent Labs  Lab 05/05/20 1606  INR 1.6*   Recent Labs    05/05/20 1405  FERRITIN 22*  TIBC 437  IRON 21*   Urine analysis: No results found for: COLORURINE, APPEARANCEUR, [place], [place], GLUCOSEU, HGBUR, BILIRUBINUR, KETONESUR, PROTEINUR, UROBILINOGEN, NITRITE, LEUKOCYTESUR   Radiologic Exams on Admission:   DG Chest Portable 1 View  Result Date:  05/05/2020 CLINICAL DATA:  Shortness of breath EXAM: PORTABLE CHEST 1 VIEW COMPARISON:  10/29/2018, 06/09/2018 CT 03/10/2019 FINDINGS: Cardiomegaly with vascular congestion and diffuse interstitial opacities suspicious for pulmonary edema. Small bilateral pleural effusions with possible loculation on the left. Volume loss left thorax. Hazy opacity at the left lung base. Aortic atherosclerosis. Chronic left-sided rib fractures. IMPRESSION: 1. Cardiomegaly with vascular congestion and diffuse interstitial opacities suspicious for pulmonary edema. Small bilateral pleural effusions with possible loculation on the left. 2. Hazy opacity at the left base, may be due to combination of pleural effusion and consolidation/possible round atelectasis noted on prior CT. Chest CT follow-up previously recommended. Electronically Signed   By: Donavan Foil M.D.   On: 05/05/2020 16:44    EKG:   Independently reviewed.  Orders placed or performed during the hospital encounter of 05/05/20   ED EKG   ED EKG   EKG 12-Lead   ---------------------------------------------------------------------------------------------------------------------------------------    Assessment / Plan:   Principal Problem:   GIB (gastrointestinal bleeding) Active Problems:   Fecal occult blood test positive   Absolute anemia   Arterial vascular disease   Diabetes (HCC)   Acid reflux   HLD (hyperlipidemia)   Arthritis, degenerative   Acute GI bleed -With history of liver cirrhosis, possible varices -Patient will be admitted to stepdown unit -Patient has been started on IV octreotide and Protonix will be continued -Patient will be kept n.p.o.  -GI Dr.Wohl consulted appreciate evaluation and further recommendation -Monitoring H&H closely -Last colonoscopy per records 12/14/2013 -Patient is adamant that he would not want to go through colonoscopy again due to bad experience  Acute symptomatic anemia - in a setting of GI  bleed - in a  setting of chronic anemia of iron deficiency -Hemoglobin 6.3/hematocrit of 20.2 (baseline hemoglobin a year ago 10.9) -Currently ED staff has ordered 2 units of PRBC to be transfused we will check an H&H posttransfusion may need further transfusion  -we will monitor closely -We will continue iron supplements -Patient's home medication consistent with Tylenol, multivitamin, thiamine, folate, Aldactone  History of liver cirrhosis, splenomegaly -With remote history of alcohol abuse -LFTs within normal limits exception of total bilirubin mildly elevated to 1.4, -INR elevated -Avoiding hepatotoxins -Evaluating and considering holding statins  Hyperlipidemia -History of liver cirrhosis, LFTs within normal limits -Considering holding statins (  simvastatin)  Diabetes mellitus type 2 -Checking hemoglobin A1c -We will check CBG QA CHS with SSI coverage -Home medication of JANUMET will be on hold -Diabetic diet  Hypertension -Patient is on atenolol, apparently Aldactone was DC'd as an outpatient -Monitoring BP closely -Continue atenolol, as needed hydralazine  History of coronary artery disease -Patient is now on aspirin, only on statins, and atenolol   No history of CHF, patient seems to have some edema -May be due to albumin deficiency-due to chronic liver failure -Current albumin level 3.1 (low ) total protein 7.4 (normal) -Supposed to be on Lasix at home 40 mg daily will be resumed (Apparently patient was not taking) -We will obtain a 2D echocardiogram, monitor proBNP, daily weights I's and O's We will rule out CHF,   Acute renal sufficiency -Creatinine elevated to 1.80 >>  -Baseline creatinine (1.12-1.62) -Likely due to severe anemia, volume depletion -We will monitor BUN/creatinine closely -Avoiding nephrotoxins  Cultures:  -non Antimicrobial: -non  Consults called:  GI; Dr. Allen Norris / Dr. Vicente Males   -------------------------------------------------------------------------------------------------------------------------------------------- DVT prophylaxis: SCD/Compression stockings Code Status:   Code Status: Full Code   Admission status: Patient will be admitted as Inpatient, with a greater than 2 midnight length of stay.   Family Communication:  none at bedside  (The above findings and plan of care has been discussed with patient in detail, the patient expressed understanding and agreement of above plan)  -----------------------------------------------------------------------------------------------------------------------------------  Disposition Plan: >3 days Status is: Inpatient  Remains inpatient appropriate because:Inpatient level of care appropriate due to severity of illness   Dispo: The patient is from: Home              Anticipated d/c is to: Home              Anticipated d/c date is: 3 days              Patient currently is not medically stable to d/c.       ----------------------------------------------------------------------------------------------------------------------------------------------------  Time spent: > than  8  Min.   SIGNED: Deatra James, MD, FACP, FHM. [facility],  Pager (Please use [email or web address] to page to text)  If 7PM-7AM, please contact night-coverage [email or web address] 05/05/2020, 4:55 PM

## 2020-05-05 NOTE — Progress Notes (Signed)
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  Patient complains of worsening shortness of breath.  Complains of shortness of breath even after walking few steps.  Worsening swelling in the legs.  Denies any alcohol in the last 1 month.  Easy bruising.  Feels poorly.  Denies any blood in stools or black or stools.  Last colonoscopy 2015.  Review of Systems  Constitutional: Positive for malaise/fatigue. Negative for chills, diaphoresis, fever and weight loss.  HENT: Negative for nosebleeds and sore throat.   Eyes: Negative for double vision.  Respiratory: Positive for shortness of breath. Negative for cough, hemoptysis, sputum production and wheezing.   Cardiovascular: Positive for leg swelling. Negative for chest pain, palpitations and orthopnea.  Gastrointestinal: Negative for abdominal pain, blood in stool, constipation, diarrhea, heartburn, melena, nausea and vomiting.  Genitourinary: Negative for dysuria, frequency and urgency.  Musculoskeletal: Positive for back pain and joint pain.  Skin: Negative.  Negative for itching and rash.  Neurological: Positive for dizziness. Negative for tingling, focal weakness, weakness and headaches.  Endo/Heme/Allergies: Bruises/bleeds easily.  Psychiatric/Behavioral: Negative for depression. The patient is not nervous/anxious and does not have insomnia.      PAST MEDICAL HISTORY :  Past Medical History:  Diagnosis Date   ASCVD (arteriosclerotic  cardiovascular disease)    Chronic pulmonary hypertension (HCC)    Diabetes mellitus without complication (HCC)    GERD (gastroesophageal reflux disease)    Heart attack ([place]) 1989   Heart disease    Hyperlipidemia    Hypertension    IDA (iron deficiency anemia)    Moderate mitral insufficiency    Moderate tricuspid insufficiency    OA (osteoarthritis)    Paroxysmal A-fib (HCC)    Thrombocytosis (HCC)    Tremor     PAST SURGICAL HISTORY : Past surgical history reviewed.    FAMILY HISTORY :   Family History  Problem Relation Age of Onset   Heart attack Father     SOCIAL HISTORY:   Social History   Tobacco Use   Smoking status: Former Smoker    Packs/day: 0.50    Years: 40.00    Pack years: 20.00    Types: Cigarettes    Quit date: 04/18/1997    Years since quitting: 23.0   Smokeless tobacco: Never Used  Vaping Use   Vaping Use: Never used  Substance Use Topics   Alcohol use: Not Currently    Alcohol/week: 50.0 standard drinks    Types: 50 Shots of liquor per week    Comment: "I mix a couple cups whisky/day"   Drug use: Not Currently    ALLERGIES:  is allergic to aspirin and dextrose.  MEDICATIONS:  Current Outpatient Medications  Medication Sig Dispense Refill   acetaminophen (TYLENOL) 500 MG tablet Take 500 mg by mouth every 8 (eight) hours as needed.     atenolol (TENORMIN) 25 MG tablet Take 25 mg by mouth 2 (two) times daily.      folic acid (FOLVITE) 1 MG tablet TAKE 1 TABLET BY MOUTH ONCE  DAILY 90 tablet 1   JANUMET 50-500 MG tablet Take 1 tablet by mouth daily.  10   Multiple Vitamin (MULTIVITAMIN WITH MINERALS) TABS tablet Take 1 tablet by mouth daily.     potassium chloride SA (K-DUR,KLOR-CON) 20 MEQ tablet Take 1 tablet (20 mEq total) by mouth daily. 30 tablet 0   quiNINE (QUALAQUIN) 324 MG capsule Take 324 mg by mouth daily as needed.     simvastatin (ZOCOR) 40 MG tablet Take 40 mg by mouth daily.     thiamine 100 MG  tablet Take 1 tablet (100 mg total) by mouth daily. 30 tablet    furosemide (LASIX) 40 MG tablet Take 1 tablet by mouth daily. (Patient not taking: Reported on 05/05/2020)  2   protein supplement shake (PREMIER PROTEIN) LIQD Take 325 mLs (11 oz total) by mouth 2 (two) times daily between meals. (Patient not taking: Reported on 04/28/2019) 60 Can 0   spironolactone (ALDACTONE) 25 MG tablet Take 1 tablet (25 mg total) by mouth daily. 30 tablet 11   No current facility-administered medications for this visit.    PHYSICAL EXAMINATION:  BP (!) 117/46    Pulse (!) 50    Temp 97.9 F (36.6 C) (Tympanic)    Resp 20    Ht 5\' 9"  (1.753 m)    Wt 235 lb (106.6 kg)    SpO2 90%    BMI 34.70 kg/m   Filed Weights   05/05/20 1425  Weight: 235 lb (106.6 kg)    Physical Exam Constitutional:      Comments: He is accompanied by his wife. He is in a wheelchair.  HENT:     Head: Normocephalic and atraumatic.     Mouth/Throat:     Pharynx: No oropharyngeal exudate.  Eyes:     Pupils: Pupils are equal, round, and reactive to light.  Cardiovascular:     Rate and Rhythm: Normal rate and regular rhythm.  Pulmonary:     Effort: No respiratory distress.     Breath sounds: No wheezing.     Comments: Decreased bilateral air entry.  Abdominal:     General: Bowel sounds are normal. There is no distension.     Palpations: Abdomen is soft. There is no mass.     Tenderness: There is no abdominal tenderness. There is no guarding or rebound.  Musculoskeletal:        General: No tenderness. Normal range of motion.     Cervical back: Normal range of motion and neck supple.     Right lower leg: Edema present.     Left lower leg: Edema present.  Skin:    General: Skin is warm.     Comments: Multiple bruises noted.  Neurological:     Mental Status: He is alert and oriented to person, place, and time.  Psychiatric:        Mood and Affect: Affect normal.      LABORATORY DATA:  I have reviewed the data as  listed    Component Value Date/Time   NA 138 05/05/2020 1405   K 4.5 05/05/2020 1405   CL 109 05/05/2020 1405   CO2 20 (L) 05/05/2020 1405   GLUCOSE 109 (H) 05/05/2020 1405   BUN 33 (H) 05/05/2020 1405   CREATININE 1.80 (H) 05/05/2020 1405   CREATININE 0.86 01/25/2015 0856   CALCIUM 8.5 (L) 05/05/2020 1405   PROT 7.4 05/05/2020 1405   ALBUMIN 3.1 (L) 05/05/2020 1405   AST 22 05/05/2020 1405  ALT 11 05/05/2020 1405   ALKPHOS 63 05/05/2020 1405   BILITOT 1.4 (H) 05/05/2020 1405   GFRNONAA 36 (L) 05/05/2020 1405   GFRNONAA >60 01/25/2015 0856   GFRAA 42 (L) 05/05/2020 1405   GFRAA >60 01/25/2015 0856    No results found for: SPEP, UPEP  Lab Results  Component Value Date   WBC 4.7 05/05/2020   NEUTROABS 3.3 05/05/2020   HGB 6.3 (L) 05/05/2020   HCT 20.2 (L) 05/05/2020   MCV 83.5 05/05/2020   PLT 101 (L) 05/05/2020      Chemistry      Component Value Date/Time   NA 138 05/05/2020 1405   K 4.5 05/05/2020 1405   CL 109 05/05/2020 1405   CO2 20 (L) 05/05/2020 1405   BUN 33 (H) 05/05/2020 1405   CREATININE 1.80 (H) 05/05/2020 1405   CREATININE 0.86 01/25/2015 0856      Component Value Date/Time   CALCIUM 8.5 (L) 05/05/2020 1405   ALKPHOS 63 05/05/2020 1405   AST 22 05/05/2020 1405   ALT 11 05/05/2020 1405   BILITOT 1.4 (H) 05/05/2020 1405       RADIOGRAPHIC STUDIES: I have personally reviewed the radiological images as listed and agreed with the findings in the report. No results found.   ASSESSMENT & PLAN:   Thrombocytopenia ([place]) #Thrombocytopenia-40s to 70s-cirrhosis/alcohol toxicity.  Asymptomatic except for easy bruising [see discussion below] platelets 60s.   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   #Shortness of breath-worsening; patient asymptomatic.  Suspect fluid overload/decompensated cirrhosis-recommend  urgent evaluation in the emergency room.  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  Stable.  # DISPOSITION:  # ER today-  #Follow-up to be decided-Dr.B  Cc; Dr.Sparks    No orders of the defined types were placed in this encounter.    Cammie Sickle, MD 05/05/2020 3:00 PM

## 2020-05-06 ENCOUNTER — Inpatient Hospital Stay
Admit: 2020-05-06 | Discharge: 2020-05-06 | Disposition: A | Discharge status: Home / Self Care | Payer: Medicare Other | Attending: Family Medicine | Admitting: Family Medicine

## 2020-05-06 ENCOUNTER — Inpatient Hospital Stay: Payer: Medicare Other

## 2020-05-06 DIAGNOSIS — M189 Osteoarthritis of first carpometacarpal joint, unspecified: Secondary | ICD-10-CM

## 2020-05-06 DIAGNOSIS — D508 Other iron deficiency anemias: Secondary | ICD-10-CM

## 2020-05-06 DIAGNOSIS — K921 Melena: Secondary | ICD-10-CM

## 2020-05-06 LAB — HEMOGLOBIN AND HEMATOCRIT, BLOOD
HCT: 23.7 % — ABNORMAL LOW (ref 39.0–52.0)
HCT: 26.3 % — ABNORMAL LOW (ref 39.0–52.0)
Hemoglobin: 7.4 g/dL — ABNORMAL LOW (ref 13.0–17.0)
Hemoglobin: 8.3 g/dL — ABNORMAL LOW (ref 13.0–17.0)

## 2020-05-06 LAB — GLUCOSE, CAPILLARY
Glucose-Capillary: 101 mg/dL — ABNORMAL HIGH (ref 70–99)
Glucose-Capillary: 120 mg/dL — ABNORMAL HIGH (ref 70–99)
Glucose-Capillary: 123 mg/dL — ABNORMAL HIGH (ref 70–99)
Glucose-Capillary: 155 mg/dL — ABNORMAL HIGH (ref 70–99)

## 2020-05-06 LAB — BASIC METABOLIC PANEL
Anion gap: 8 (ref 5–15)
BUN: 33 mg/dL — ABNORMAL HIGH (ref 8–23)
CO2: 22 mmol/L (ref 22–32)
Calcium: 8.4 mg/dL — ABNORMAL LOW (ref 8.9–10.3)
Chloride: 108 mmol/L (ref 98–111)
Creatinine, Ser: 1.81 mg/dL — ABNORMAL HIGH (ref 0.61–1.24)
GFR calc Af Amer: 42 mL/min — ABNORMAL LOW (ref >60)
GFR calc non Af Amer: 36 mL/min — ABNORMAL LOW (ref >60)
Glucose, Bld: 113 mg/dL — ABNORMAL HIGH (ref 70–99)
Potassium: 5 mmol/L (ref 3.5–5.1)
Sodium: 138 mmol/L (ref 135–145)

## 2020-05-06 LAB — CBC
HCT: 22.9 % — ABNORMAL LOW (ref 39.0–52.0)
Hemoglobin: 7.1 g/dL — ABNORMAL LOW (ref 13.0–17.0)
MCH: 26.2 pg (ref 26.0–34.0)
MCHC: 31 g/dL (ref 30.0–36.0)
MCV: 84.5 fL (ref 80.0–100.0)
Platelets: 90 10*3/uL — ABNORMAL LOW (ref 150–400)
RBC: 2.71 MIL/uL — ABNORMAL LOW (ref 4.22–5.81)
RDW: 16.6 % — ABNORMAL HIGH (ref 11.5–15.5)
WBC: 4.4 10*3/uL (ref 4.0–10.5)
nRBC: 0 % (ref 0.0–0.2)

## 2020-05-06 LAB — ACETAMINOPHEN LEVEL: Acetaminophen (Tylenol), Serum: <10 ug/mL — ABNORMAL LOW (ref 10–30)

## 2020-05-06 LAB — HEMOGLOBIN A1C
Hgb A1c MFr Bld: 4.7 % — ABNORMAL LOW (ref 4.8–5.6)
Mean Plasma Glucose: 88.19 mg/dL

## 2020-05-06 LAB — PROTIME-INR
INR: 1.7 — ABNORMAL HIGH (ref 0.8–1.2)
Prothrombin Time: 18.9 seconds — ABNORMAL HIGH (ref 11.4–15.2)

## 2020-05-06 LAB — ECHOCARDIOGRAM COMPLETE
Height: 69 in
Weight: 3763.69 oz

## 2020-05-06 LAB — PREALBUMIN: Prealbumin: 8.2 mg/dL — ABNORMAL LOW (ref 18–38)

## 2020-05-06 LAB — PREPARE RBC (CROSSMATCH)

## 2020-05-06 LAB — APTT: aPTT: 44 seconds — ABNORMAL HIGH (ref 24–36)

## 2020-05-06 MED ORDER — SODIUM CHLORIDE 0.9 % IV SOLN
200.0000 mg | Freq: Once | INTRAVENOUS | Status: AC
  Administered 2020-05-06: 200 mg via INTRAVENOUS
  Filled 2020-05-06: qty 10

## 2020-05-06 MED ORDER — CHLORHEXIDINE GLUCONATE CLOTH 2 % EX PADS
6.0000 | MEDICATED_PAD | Freq: Every day | CUTANEOUS | Status: DC
  Administered 2020-05-08 – 2020-05-11 (×5): 6 via TOPICAL

## 2020-05-06 MED ORDER — SODIUM CHLORIDE 0.9% IV SOLUTION: Freq: Once | INTRAVENOUS | Status: DC

## 2020-05-06 MED ORDER — MELATONIN 5 MG PO TABS
2.5000 mg | ORAL_TABLET | Freq: Every day | ORAL | Status: DC
  Administered 2020-05-06 – 2020-05-10 (×5): 2.5 mg via ORAL
  Filled 2020-05-06 (×2): qty 0.5
  Filled 2020-05-06: qty 1
  Filled 2020-05-06 (×2): qty 0.5
  Filled 2020-05-06: qty 1

## 2020-05-06 MED ORDER — FUROSEMIDE 10 MG/ML IJ SOLN
40.0000 mg | Freq: Two times a day (BID) | INTRAMUSCULAR | Status: DC
  Administered 2020-05-06 – 2020-05-09 (×8): 40 mg via INTRAVENOUS
  Filled 2020-05-06 (×8): qty 4

## 2020-05-06 NOTE — Progress Notes (Signed)
OT Cancellation Note  Patient Details Name: Patrick [name] MRN: [id number] DOB: 1946-07-03   Cancelled Treatment:    Reason Eval/Treat Not Completed: Other (comment)  OT consult received and chart reviewed. Pt getting blood at this time and reports having just gotten an Echo and being "worn out". Pt states he does not want to get OOB. Will f/u for OT evaluation at later date/time as able. Thank you.  Gerrianne Scale, [place], OTR/L ascom [phone] 05/06/20, 4:03 PM

## 2020-05-06 NOTE — Progress Notes (Signed)
*  PRELIMINARY RESULTS* Echocardiogram 2D Echocardiogram has been performed.  Patrick [name] 05/06/2020, 3:56 PM

## 2020-05-06 NOTE — Progress Notes (Signed)
PROGRESS NOTE    Patient: Patrick [name]                            PCP: Idelle Crouch, MD                    DOB: [birth year]            DOA: 05/05/2020 HWE:[id number]             DOS: 05/06/2020, 11:10 AM   LOS: 1 day   Date of Service: The patient was seen and examined on 05/06/2020  Subjective:   The patient was seen and examined this Am. The patient was seen and examined this morning, stable awake alert, still complains of lower extremity weakness, mild shortness of breath, Denies any further noted bleeding per rectum overnight. .  Brief Narrative:   ANTWONE CAPOZZOLI is a 74 y.o. male with medical history significant of   iron deficiency anemia, thrombocytopenia, liver cirrhosis/splenomegal, (history of alcohol abuse, tobacco abuse), hypertension, hyperlipidemia, diabetes mellitus type II, GERD, CAD, Presented with generalized weakness, shortness of breath-noted to be severely anemic at his oncology follow-up visit today.  Patient reports that his symptoms started 2 weeks ago with progressive weakness with significant malaise, some nausea vomiting diarrhea and melena.  He had a follow-up with his oncology for continued work-up of iron deficiency anemia.  Noted to be progressively weak, having shortness of breath, anemic with a hemoglobin of 6.0. He seemed to have a history of liver cirrhosis, splenomegaly with remote history of alcohol abuse, previous GI bleed on 05/05/2020 (last colonoscopy per record 10/13/2014) Patient was sent to the ED for further evaluation   Assessment & Plan:   Principal Problem:   GIB (gastrointestinal bleeding) Active Problems:   Fecal occult blood test positive   Absolute anemia   Arterial vascular disease   Diabetes (HCC)   Acid reflux   HLD (hyperlipidemia)   Arthritis, degenerative    Acute GI bleed -With history of liver cirrhosis, possible varices -Patient still n.p.o., has not been seen by gastroenterologist -He was supposed  to receive 3 units of blood per nursing staff he only received 1 unit overnight -Hemoglobin improved from 6.3 >>> 7.1 -Another 2 units of PRBC ordered this morning for transfusion -on IV octreotide and Protonix will be continued -Patient will be kept n.p.o.  -GI Dr.Wohl and Dr. Vicente Males has been notified and consulted -Monitoring H&H closely -Last colonoscopy per records 12/14/2013   Acute symptomatic anemia - in a setting of GI bleed - in a  setting of chronic anemia of iron deficiency -Hemoglobin 6.3/hematocrit of 20.2 (baseline hemoglobin a year ago 10.9) >>7.1 -Has only received 1 unit of PRBC, and her 2 units will be transferred this morning (2 units from ED was never transfused)  -we will monitor closely -We will continue iron supplements   History of liver cirrhosis, splenomegaly -With remote history of alcohol abuse -LFTs within normal limits exception of total bilirubin mildly elevated to 1.4, -INR elevated 1.7  -Avoiding hepatotoxins -Evaluating and considering holding statins  Hyperlipidemia -History of liver cirrhosis, LFTs within normal limits -Considering holding statins (simvastatin)  Diabetes mellitus type 2 -Checking hemoglobin A1c -We will check CBG QA CHS with SSI coverage -Home medication of JANUMET will be on hold -Diabetic diet  Hypertension -Patient is on atenolol, apparently Aldactone was DC'd as an outpatient -Monitoring BP closely -Continue atenolol, as needed hydralazine  History of coronary artery disease -Patient is now on aspirin, only on statins, and atenolol   No history of CHF, patient seems to have some edema -May be due to albumin deficiency-due to chronic liver failure -Current albumin level 3.1 (low ) total protein 7.4 (normal) -Supposed to be on Lasix at home 40 mg daily will be resumed (Apparently patient was not taking) -We will obtain a 2D echocardiogram, monitor proBNP, daily weights I's and O's We will rule out CHF,    Acute renal sufficiency -Creatinine elevated to 1.80 >> 1.81 -Baseline creatinine (1.12-1.62) -Likely due to severe anemia, volume depletion -We will monitor BUN/creatinine closely -Avoiding nephrotoxins  Cultures:  -non Antimicrobial: -non  Consults called:  GI; Dr. Allen Norris / Dr. Vicente Males  -------------------------------------------------------------------------------------------------------------------------------------------- DVT prophylaxis: SCD/Compression stockings Code Status:   Code Status: Full Code   Admission status: Patient will be admitted as Inpatient, with a greater than 2 midnight length of stay.   Family Communication:  none at bedside  (The above findings and plan of care has been discussed with patient in detail, the patient expressed understanding and agreement of above plan)  -----------------------------------------------------------------------------------------------------------------------------------  Disposition Plan: >3 days Status is: Inpatient  Remains inpatient appropriate because:Inpatient level of care appropriate due to severity of illness   Dispo: The patient is from: Home  Anticipated d/c is to: Home  Anticipated d/c date is: 3 days  Patient currently is not medically stable to d/c.         Nutritional status:               Procedures:   No admission procedures for hospital encounter.     Antimicrobials:  Anti-infectives (From admission, onward)   Start     Dose/Rate Route Frequency Ordered Stop   05/05/20 1630  cefTRIAXone (ROCEPHIN) 1 g in sodium chloride 0.9 % 100 mL IVPB        1 g 200 mL/hr over 30 Minutes Intravenous  Once 05/05/20 1619 05/05/20 1740       Medication:  . sodium chloride   Intravenous Once  . sodium chloride   Intravenous Once  . atenolol  25 mg Oral BID  . Chlorhexidine Gluconate Cloth  6 each Topical Daily  . docusate sodium  100 mg  Oral BID  . furosemide  40 mg Intravenous Q12H  . insulin aspart  0-9 Units Subcutaneous TID WC  . multivitamin with minerals  1 tablet Oral Daily  . simvastatin  40 mg Oral Daily  . sodium chloride flush  3 mL Intravenous Q12H    albuterol, hydrALAZINE, HYDROmorphone (DILAUDID) injection, levalbuterol, senna-docusate, sorbitol   Objective:   Vitals:   05/06/20 1000 05/06/20 1021 05/06/20 1050 05/06/20 1100  BP: (!) 151/59 138/70 137/61 140/60  Pulse: (!) 45 (!) 53 (!) 49 (!) 52  Resp: (!) 21 (!) 25 (!) 24 (!) 25  Temp:  97.6 F (36.4 C) 97.9 F (36.6 C)   TempSrc:  Oral Oral   SpO2: 96% 100% 100% 100%  Weight:      Height:        Intake/Output Summary (Last 24 hours) at 05/06/2020 1110 Last data filed at 05/06/2020 1106 Gross per 24 hour  Intake 988.46 ml  Output 800 ml  Net 188.46 ml   Filed Weights   05/05/20 1524 05/05/20 1832  Weight: 106.6 kg 106.7 kg     Examination:   Physical Exam  Constitution:  Alert, cooperative, no distress,  Appears calm and comfortable  Psychiatric: Normal  and stable mood and affect, cognition intact,   HEENT: Normocephalic, PERRL, otherwise with in Normal limits  Chest:Chest symmetric Cardio vascular:  S1/S2, RRR, No murmure, No Rubs or Gallops  pulmonary: Clear to auscultation bilaterally, respirations unlabored, negative wheezes / crackles Abdomen: Soft, non-tender, non-distended, bowel sounds,no masses, no organomegaly Muscular skeletal: Limited exam - in bed, able to move all 4 extremities, Normal strength,  Neuro: CNII-XII intact. , normal motor and sensation, reflexes intact  Extremities:  +4 pitting edema lower extremities, +2 pulses  Skin: Dry, warm to touch, negative for any Rashes, No open wounds Wounds: per nursing documentation    ------------------------------------------------------------------------------------------------------------------------------------------    LABs:  CBC Latest Ref Rng & Units 05/06/2020  05/06/2020 05/05/2020  WBC 4.0 - 10.5 K/uL - 4.4 4.7  Hemoglobin 13.0 - 17.0 g/dL 7.4(L) 7.1(L) 6.3(L)  Hematocrit 39 - 52 % 23.7(L) 22.9(L) 20.2(L)  Platelets 150 - 400 K/uL - 90(L) 101(L)   CMP Latest Ref Rng & Units 05/06/2020 05/05/2020 05/05/2020  Glucose 70 - 99 mg/dL 113(H) - 109(H)  BUN 8 - 23 mg/dL 33(H) - 33(H)  Creatinine 0.61 - 1.24 mg/dL 1.81(H) - 1.80(H)  Sodium 135 - 145 mmol/L 138 - 138  Potassium 3.5 - 5.1 mmol/L 5.0 - 4.5  Chloride 98 - 111 mmol/L 108 - 109  CO2 22 - 32 mmol/L 22 - 20(L)  Calcium 8.9 - 10.3 mg/dL 8.4(L) 8.7(L) 8.5(L)  Total Protein 6.5 - 8.1 g/dL - - 7.4  Total Bilirubin 0.3 - 1.2 mg/dL - - 1.4(H)  Alkaline Phos 38 - 126 U/L - - 63  AST 15 - 41 U/L - - 22  ALT 0 - 44 U/L - - 11       Micro Results Recent Results (from the past 240 hour(s))  SARS Coronavirus 2 by RT PCR (hospital order, performed in [facility] hospital lab) Nasopharyngeal Nasopharyngeal Swab     Status: None   Collection Time: 05/05/20  4:23 PM   Specimen: Nasopharyngeal Swab  Result Value Ref Range Status   SARS Coronavirus 2 NEGATIVE NEGATIVE Final    Comment: (NOTE) SARS-CoV-2 target nucleic acids are NOT DETECTED. Fact Sheet for Patients:   StrictlyIdeas.no  Fact Sheet for Healthcare Providers: BankingDealers.co.za  This test is not yet approved or  cleared by the [place] FDA and  MRSA PCR Screening     Status: None   Collection Time: 05/05/20  6:30 PM   Specimen: Nasal Mucosa; Nasopharyngeal  Result Value Ref Range Status   MRSA by PCR NEGATIVE NEGATIVE Final    Comment:        The GeneXpert MRSA Assay (FDA approved for NASAL specimens only), is one component of a comprehensive MRSA colonization surveillance program. It is not intended to diagnose MRSA infection nor to guide or monitor treatment for MRSA infections. Performed at [facility], [address] [place], [place] [address]     Radiology  Reports DG Chest Portable 1 View  Result Date: 05/05/2020 CLINICAL DATA:  Shortness of breath EXAM: PORTABLE CHEST 1 VIEW COMPARISON:  10/29/2018, 06/09/2018 CT 03/10/2019 FINDINGS: Cardiomegaly with vascular congestion and diffuse interstitial opacities suspicious for pulmonary edema. Small bilateral pleural effusions with possible loculation on the left. Volume loss left thorax. Hazy opacity at the left lung base. Aortic atherosclerosis. Chronic left-sided rib fractures. IMPRESSION: 1. Cardiomegaly with vascular congestion and diffuse interstitial opacities suspicious for pulmonary edema. Small bilateral pleural effusions with possible loculation on the left. 2. Hazy opacity at the left  base, may be due to combination of pleural effusion and consolidation/possible round atelectasis noted on prior CT. Chest CT follow-up previously recommended. Electronically Signed   By: Donavan Foil M.D.   On: 05/05/2020 16:44    SIGNED: Deatra James, MD, FACP, FHM. [facility],  Pager (please use [email or web address] to page/text)  If 7PM-7AM, please contact night-coverage [email or web address], Password [facility] 05/06/2020, 11:10 AM

## 2020-05-06 NOTE — Consult Note (Signed)
Patrick [name] , MD [address], [place], [place], [place], [address] [address] [place], [place], [place], [address] Phone: [phone]  Fax: [phone]  Consultation  Referring Provider:   Dr Roger [name] Primary Care Physician:  Patrick Crouch, MD Primary Gastroenterologist:  Patrick. Alice [name]         Reason for Consultation:     GI bleed   Date of Admission:  05/05/2020 Date of Consultation:  05/06/2020         HPI:   Patrick [name] is a 74 y.o. male was admitted to the hospital with 2 weeks history of weakness, nausea vomiting and melena.  Followed up with oncology for iron deficiency anemia.  Seen and evaluated by Patrick. Alice [name] in the past last seen in December 2019.  Carries a diagnosis of cirrhosis, ascites, hepatic encephalopathy.  Prior colonoscopy demonstrated NGO ectasia in the ascending colon.  Angiectasia was also seen in the stomach treated with APC.  GI note from 2019 carries a diagnosis of alcoholic cirrhosis of the liver.  Quit alcohol use in 2019.  Baseline hemoglobin 1 year back was 10.9 g with an MCV of 85.9.  When checked on 05/05/2020 hemoglobin of 6.3 g with an MCV of 83.5.  BNP was elevated at 628 creatinine elevated at 1.8 no elevation in the BUN/creatinine ratio.  Albumin 3.1.  Total bilirubin 1.4 PT/INR 1.6 iron of 21 ferritin of 22.  Being evaluated for CHF.  Says he feels short of breath when he lays flat, denies any NSAID use, brown stool yesterday , was black previously, swelling of feet , denies any hematemesis or abdominal pain.   Past Medical History:  Diagnosis Date  . ASCVD (arteriosclerotic cardiovascular disease)   . Chronic pulmonary hypertension (Patrick [name])   . Diabetes mellitus without complication (Patrick [name])   . GERD (gastroesophageal reflux disease)   . Heart attack (Patrick [name]) 1989  . Heart disease   . Hyperlipidemia   . Hypertension   . IDA (iron deficiency anemia)   . Moderate mitral insufficiency   . Moderate tricuspid insufficiency   . OA  (osteoarthritis)   . Paroxysmal A-fib (Patrick [name])   . Thrombocytosis (Patrick [name])   . Tremor     Past Surgical History:  Procedure Laterality Date  . [place]   with stainless steel stent  . CATARACT EXTRACTION    . Closed Reduction Vertebral Process Fracture  1966  . COLONOSCOPY  10/13/2014  . HEMORRHOIDECTOMY WITH HEMORRHOID BANDING    . HERNIA REPAIR     x 2  . KNEE SURGERY     bilateral knee surgery    Prior to Admission medications   Medication Sig Start Date End Date Taking? Authorizing Provider  cyanocobalamin 1000 MCG tablet Take 1,000 mcg by mouth daily at 6 (six) AM.   Yes [provider]  folic acid (FOLVITE) 1 MG tablet TAKE 1 TABLET BY MOUTH ONCE DAILY Patient taking differently: Take 1 mg by mouth daily.  11/09/19  Yes Cammie Sickle, MD  JANUMET 50-1000 MG tablet Take 1 tablet by mouth at bedtime. 04/14/20  Yes [provider]  Multiple Vitamin (MULTIVITAMIN WITH MINERALS) TABS tablet Take 1 tablet by mouth daily. 06/12/18  Yes Gouru, Illene Silver, MD  potassium chloride SA (K-DUR,KLOR-CON) 20 MEQ tablet Take 1 tablet (20 mEq total) by mouth daily. 06/11/18  Yes Gouru, Illene Silver, MD  simvastatin (ZOCOR) 40 MG tablet Take 40 mg by mouth daily.   Yes [provider]  spironolactone (  ALDACTONE) 100 MG tablet Take 50 mg by mouth daily at 6 (six) AM. 03/16/20  Yes [provider]  SYMBICORT 160-4.5 MCG/ACT inhaler Inhale 2 puffs into the lungs 2 (two) times daily. 04/05/20  Yes [provider]  tamsulosin (FLOMAX) 0.4 MG CAPS capsule Take 0.4 mg by mouth daily at 6 (six) AM. 05/18/19 05/17/20 Yes [provider]  thiamine 100 MG tablet Take 1 tablet (100 mg total) by mouth daily. 06/12/18  Yes Gouru, Illene Silver, MD  torsemide (DEMADEX) 20 MG tablet Take 20 mg by mouth 2 (two) times daily. 02/15/20  Yes [provider]  acetaminophen (TYLENOL) 500 MG tablet Take 500 mg by mouth every 8 (eight) hours as needed.    [provider]  furosemide (LASIX) 40 MG tablet Take 1 tablet by mouth daily. Patient not taking: Reported on 05/05/2020 03/26/18   [provider]  nabumetone (RELAFEN) 500 MG tablet Take 500 mg by mouth at bedtime. Patient not taking: Reported on 05/05/2020 04/05/20   [provider]  protein supplement shake (PREMIER PROTEIN) LIQD Take 325 mLs (11 oz total) by mouth 2 (two) times daily between meals. Patient not taking: Reported on 04/28/2019 06/12/18   Patrick Mango, MD  quiNINE (QUALAQUIN) 324 MG capsule Take 324 mg by mouth daily as needed.    [provider]    Family History  Problem Relation Age of Onset  . Heart attack Father      Social History   Tobacco Use  . Smoking status: Former Smoker    Packs/day: 0.50    Years: 40.00    Pack years: 20.00    Types: Cigarettes    Quit date: 04/18/1997    Years since quitting: 23.0  . Smokeless tobacco: Never Used  Vaping Use  . Vaping Use: Never used  Substance Use Topics  . Alcohol use: Not Currently    Alcohol/week: 50.0 standard drinks    Types: 50 Shots of liquor per week    Comment: "I mix a couple cups whisky/day"  . Drug use: Not Currently    Allergies as of 05/05/2020 - Review Complete 05/05/2020  Allergen Reaction Noted  . Aspirin Other (See Comments) 03/26/2018  . Dextrose Other (See Comments) 03/26/2018    Review of Systems:    All systems reviewed and negative except where noted in HPI.   Physical Exam:  Vital signs in last 24 hours: Temp:  [97.6 F (36.4 C)-97.9 F (36.6 C)] 97.9 F (36.6 C) (07/10 1050) [name] Rate:  [34-68] 52 (07/10 1100) Resp:  [0-27] 25 (07/10 1100) BP: (107-151)/(43-70) 140/60 (07/10 1100) SpO2:  [90 %-100 %] 100 % (07/10 1100) Weight:  [106.6 kg-106.7 kg] 106.7 kg (07/09 1832) Last BM Date: 05/05/20 (per patient) General:   Pleasant, cooperative in NAD Head:  Normocephalic and atraumatic. Eyes:   No icterus.   Conjunctiva pink. PERRLA. Ears:  Normal  auditory acuity. Neck:  Supple; no masses or thyroidomegaly Lungs: Respirations even and unlabored. Lungs clear to auscultation bilaterally.   No wheezes, crackles, or rhonchi.  Heart:  Regular rate and rhythm; systolic murmur in aortic area Without murmur, clicks, rubs or gallops Abdomen:  Soft, nondistended, nontender. Normal bowel sounds. No appreciable masses or hepatomegaly.  No rebound or guarding.  Neurologic:  Alert and oriented x3;  grossly normal neurologically. Skin:  Intact without significant lesions or rashes. Cervical Nodes:  No significant cervical adenopathy. Psych:  Alert and cooperative. Normal affect.  LAB RESULTS: Recent Labs  05/05/20 1405 05/06/20 0525 05/06/20 0900  WBC 4.7 4.4  --   HGB 6.3* 7.1* 7.4*  HCT 20.2* 22.9* 23.7*  PLT 101* 90*  --    BMET Recent Labs    05/05/20 1405 05/05/20 1849 05/06/20 0525  NA 138  --  138  K 4.5  --  5.0  CL 109  --  108  CO2 20*  --  22  GLUCOSE 109*  --  113*  BUN 33*  --  33*  CREATININE 1.80*  --  1.81*  CALCIUM 8.5* 8.7* 8.4*   LFT Recent Labs    05/05/20 1405  PROT 7.4  ALBUMIN 3.1*  AST 22  ALT 11  ALKPHOS 63  BILITOT 1.4*   PT/INR Recent Labs    05/05/20 1606 05/06/20 0525  LABPROT 18.5* 18.9*  INR 1.6* 1.7*    STUDIES: DG Chest Portable 1 View  Result Date: 05/05/2020 CLINICAL DATA:  Shortness of breath EXAM: PORTABLE CHEST 1 VIEW COMPARISON:  10/29/2018, 06/09/2018 CT 03/10/2019 FINDINGS: Cardiomegaly with vascular congestion and diffuse interstitial opacities suspicious for pulmonary edema. Small bilateral pleural effusions with possible loculation on the left. Volume loss left thorax. Hazy opacity at the left lung base. Aortic atherosclerosis. Chronic left-sided rib fractures. IMPRESSION: 1. Cardiomegaly with vascular congestion and diffuse interstitial opacities suspicious for pulmonary edema. Small bilateral pleural effusions with possible loculation on the left. 2. Hazy opacity at the  left base, may be due to combination of pleural effusion and consolidation/possible round atelectasis noted on prior CT. Chest CT follow-up previously recommended. Electronically Signed   By: Donavan Foil M.D.   On: 05/05/2020 16:44      Impression / Plan:   CLARKE PERETZ is a 74 y.o. 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  Follows with hematology and has received IV iron in the past.  Quit alcohol in 2019.  Admitted with weakness and found to be anemic.  Noted drop in hemoglobin from baseline with severe iron deficiency and history of melena.  Being evaluated for heart failure with an elevated BNP.  Also has AKI.  INR is 1.6.  Plan 1.  Less likely an variceal bleed due to no history of hematemesis and no elevation in BUN/creatinine ratio. Brown stool yesterday.  Very likely bleeding from AVMs that were previously seen.  2.  IV iron, check B12, folate and urine analysis for blood loss.  Monitor CBC and transfuse as needed.  3.  Once he is cleared from a cardiology point of view in terms of his heart failure and is safe to tolerate anesthesia I would proceed with an EGD and if negative will proceed with a colonoscopy.  He will very likely need capsule study of the small bowel as an outpatient due to iron deficiency anemia and history of AVMs.  4.  His INR is 1.6 and he has a history of easy bruising.  Check vitamin D level and administer 10 mg of vitamin K subcutaneously once daily for 3 days unless there is any other reason not to do so.  I do not believe he is on any anticoagulation.  5.  Check Tylenol levels.  6.  Low-sodium diet less than 2 g of sodium per day suggest dietitian consult as increased sodium intake can worsen heart failure and ascites.  7.  Right upper quadrant ultrasound to evaluate liver, portal veins and assess  for ascites.   Thank you for involving me in the  care of this patient.      LOS: 1 day   Patrick Bellows, MD  05/06/2020, 11:22 AM

## 2020-05-07 DIAGNOSIS — I5031 Acute diastolic (congestive) heart failure: Secondary | ICD-10-CM

## 2020-05-07 DIAGNOSIS — I4891 Unspecified atrial fibrillation: Secondary | ICD-10-CM

## 2020-05-07 DIAGNOSIS — D500 Iron deficiency anemia secondary to blood loss (chronic): Secondary | ICD-10-CM

## 2020-05-07 LAB — TYPE AND SCREEN
ABO/RH(D): B POS
Antibody Screen: NEGATIVE
Unit division: 0
Unit division: 0
Unit division: 0

## 2020-05-07 LAB — BASIC METABOLIC PANEL
Anion gap: 8 (ref 5–15)
BUN: 33 mg/dL — ABNORMAL HIGH (ref 8–23)
CO2: 23 mmol/L (ref 22–32)
Calcium: 8.2 mg/dL — ABNORMAL LOW (ref 8.9–10.3)
Chloride: 106 mmol/L (ref 98–111)
Creatinine, Ser: 2.18 mg/dL — ABNORMAL HIGH (ref 0.61–1.24)
GFR calc Af Amer: 33 mL/min — ABNORMAL LOW (ref >60)
GFR calc non Af Amer: 29 mL/min — ABNORMAL LOW (ref >60)
Glucose, Bld: 112 mg/dL — ABNORMAL HIGH (ref 70–99)
Potassium: 4.4 mmol/L (ref 3.5–5.1)
Sodium: 137 mmol/L (ref 135–145)

## 2020-05-07 LAB — BPAM RBC
Blood Product Expiration Date: 202108032359
Blood Product Expiration Date: 202108052359
Blood Product Expiration Date: 202108152359
ISSUE DATE / TIME: 202107092111
ISSUE DATE / TIME: 202107101030
ISSUE DATE / TIME: 202107101329
Unit Type and Rh: 7300
Unit Type and Rh: 7300
Unit Type and Rh: 7300

## 2020-05-07 LAB — MAGNESIUM: Magnesium: 1.7 mg/dL (ref 1.7–2.4)

## 2020-05-07 LAB — CBC
HCT: 26.9 % — ABNORMAL LOW (ref 39.0–52.0)
Hemoglobin: 8.6 g/dL — ABNORMAL LOW (ref 13.0–17.0)
MCH: 27.2 pg (ref 26.0–34.0)
MCHC: 32 g/dL (ref 30.0–36.0)
MCV: 85.1 fL (ref 80.0–100.0)
Platelets: 88 10*3/uL — ABNORMAL LOW (ref 150–400)
RBC: 3.16 MIL/uL — ABNORMAL LOW (ref 4.22–5.81)
RDW: 16.1 % — ABNORMAL HIGH (ref 11.5–15.5)
WBC: 5 10*3/uL (ref 4.0–10.5)
nRBC: 0 % (ref 0.0–0.2)

## 2020-05-07 LAB — GLUCOSE, CAPILLARY
Glucose-Capillary: 106 mg/dL — ABNORMAL HIGH (ref 70–99)
Glucose-Capillary: 141 mg/dL — ABNORMAL HIGH (ref 70–99)
Glucose-Capillary: 114 mg/dL — ABNORMAL HIGH (ref 70–99)
Glucose-Capillary: 115 mg/dL — ABNORMAL HIGH (ref 70–99)

## 2020-05-07 MED ORDER — SODIUM CHLORIDE 0.9 % IV SOLN
1.0000 g | INTRAVENOUS | Status: DC
  Administered 2020-05-07 – 2020-05-08 (×2): 1 g via INTRAVENOUS
  Filled 2020-05-07 (×2): qty 1
  Filled 2020-05-07: qty 10

## 2020-05-07 MED ORDER — MAGNESIUM SULFATE 2 GM/50ML IV SOLN
2.0000 g | Freq: Once | INTRAVENOUS | Status: AC
  Administered 2020-05-07: 2 g via INTRAVENOUS
  Filled 2020-05-07: qty 50

## 2020-05-07 NOTE — Consult Note (Signed)
[facility] Consultation Note  Patient ID: Patrick [name], MRN: [id number], DOB/AGE: 31-Aug-1946 74 y.o. Admit date: 05/05/2020   Date of Consult: 05/07/2020 Primary Physician: Idelle Crouch, MD Primary Cardiologist: Nehemiah Massed  Chief Complaint:  Chief Complaint  Patient presents with  . Anemia   Reason for Consult: Known cardiovascular disease with bleeding  HPI: 74 y.o. male with known alcoholic cirrhosis and recurrent evidence of GI bleeding with anemia with a hemoglobin of 8.3 requiring treatment and currently stable without any active bleeding but needing further evaluation of extent of concerns.  The patient has a cardiac history of mild to moderate aortic valve stenosis which has been stable with an echocardiogram last year and this hospitalization showing low normal to mild global LV systolic dysfunction with ejection fraction of 45% most consistent with atrial fibrillation rather than previous myocardial infarction.  There is mild to moderate aortic stenosis severe biatrial enlargement and mild to moderate mitral and tricuspid insufficiency not significantly changed over that time.  Stress test last year showed normal myocardial perfusion without evidence of myocardial ischemia.  Patient does have chronic kidney disease stage III for which likely has exacerbated his current situation causing some pulmonary edema which appears to be improved at this time and the patient is not having any significant concerns of shortness of breath.  There is been no evidence of myocardial infarction or acute coronary syndrome during this hospitalization.  Currently the patient continues on medication management for further risk reduction cardiovascular disease including lipid management with simvastatin.  Additionally intravenous Lasix has helped his pulmonary edema and shortness of breath and atenolol has controlled his heart rate with an EKG currently showing atrial fibrillation without  evidence of other changes at a heart rate of 65 bpm.  Therefore the patient is at low risk for low risk procedure including endoscopy colonoscopy to assess for primary causes of bleeding.  Past Medical History:  Diagnosis Date  . ASCVD (arteriosclerotic cardiovascular disease)   . Chronic pulmonary hypertension ([place])   . Diabetes mellitus without complication ([place])   . GERD (gastroesophageal reflux disease)   . Heart attack ([place]) 1989  . Heart disease   . Hyperlipidemia   . Hypertension   . IDA (iron deficiency anemia)   . Moderate mitral insufficiency   . Moderate tricuspid insufficiency   . OA (osteoarthritis)   . Paroxysmal A-fib ([place])   . Thrombocytosis ([place])   . Tremor       Surgical History:  Past Surgical History:  Procedure Laterality Date  . [place]   with stainless steel stent  . CATARACT EXTRACTION    . Closed Reduction Vertebral Process Fracture  1966  . COLONOSCOPY  10/13/2014  . HEMORRHOIDECTOMY WITH HEMORRHOID BANDING    . HERNIA REPAIR     x 2  . KNEE SURGERY     bilateral knee surgery     Home Meds: Prior to Admission medications   Medication Sig Start Date End Date Taking? Authorizing Provider  cyanocobalamin 1000 MCG tablet Take 1,000 mcg by mouth daily at 6 (six) AM.   Yes [provider]  folic acid (FOLVITE) 1 MG tablet TAKE 1 TABLET BY MOUTH ONCE DAILY Patient taking differently: Take 1 mg by mouth daily.  11/09/19  Yes Cammie Sickle, MD  JANUMET 50-1000 MG tablet Take 1 tablet by mouth at bedtime. 04/14/20  Yes [provider]  Multiple Vitamin (MULTIVITAMIN WITH MINERALS) TABS tablet Take 1 tablet by mouth  daily. 06/12/18  Yes Gouru, Aruna, MD  potassium chloride SA (K-DUR,KLOR-CON) 20 MEQ tablet Take 1 tablet (20 mEq total) by mouth daily. 06/11/18  Yes Gouru, Illene Silver, MD  simvastatin (ZOCOR) 40 MG tablet Take 40 mg by mouth daily.   Yes [provider]  spironolactone (ALDACTONE) 100 MG tablet  Take 50 mg by mouth daily at 6 (six) AM. 03/16/20  Yes [provider]  SYMBICORT 160-4.5 MCG/ACT inhaler Inhale 2 puffs into the lungs 2 (two) times daily. 04/05/20  Yes [provider]  tamsulosin (FLOMAX) 0.4 MG CAPS capsule Take 0.4 mg by mouth daily at 6 (six) AM. 05/18/19 05/17/20 Yes [provider]  thiamine 100 MG tablet Take 1 tablet (100 mg total) by mouth daily. 06/12/18  Yes Gouru, Illene Silver, MD  torsemide (DEMADEX) 20 MG tablet Take 20 mg by mouth 2 (two) times daily. 02/15/20  Yes [provider]  acetaminophen (TYLENOL) 500 MG tablet Take 500 mg by mouth every 8 (eight) hours as needed.    [provider]  furosemide (LASIX) 40 MG tablet Take 1 tablet by mouth daily. Patient not taking: Reported on 05/05/2020 03/26/18   [provider]  nabumetone (RELAFEN) 500 MG tablet Take 500 mg by mouth at bedtime. Patient not taking: Reported on 05/05/2020 04/05/20   [provider]  protein supplement shake (PREMIER PROTEIN) LIQD Take 325 mLs (11 oz total) by mouth 2 (two) times daily between meals. Patient not taking: Reported on 04/28/2019 06/12/18   Nicholes Mango, MD  quiNINE (QUALAQUIN) 324 MG capsule Take 324 mg by mouth daily as needed.    [provider]    Inpatient Medications:  . sodium chloride   Intravenous Once  . sodium chloride   Intravenous Once  . atenolol  25 mg Oral BID  . Chlorhexidine Gluconate Cloth  6 each Topical Daily  . docusate sodium  100 mg Oral BID  . furosemide  40 mg Intravenous Q12H  . insulin aspart  0-9 Units Subcutaneous TID WC  . melatonin  2.5 mg Oral QHS  . multivitamin with minerals  1 tablet Oral Daily  . simvastatin  40 mg Oral Daily  . sodium chloride flush  3 mL Intravenous Q12H   . sodium chloride    . cefTRIAXone (ROCEPHIN)  IV    . octreotide  (SANDOSTATIN)    IV infusion 50 mcg/hr (05/07/20 0932)  . pantoprozole (PROTONIX) infusion 8 mg/hr (05/07/20 0935)  . pantoprazole (PROTONIX)  IVPB      Allergies:  Allergies  Allergen Reactions  . Aspirin Other (See Comments)  . Dextrose Other (See Comments)    PATIENT CAN NOT TAKE BLOOD THINNERS OF ANY TYPE     Social History   Socioeconomic History  . Marital status: Married    Spouse name: Not on file  . Number of children: Not on file  . Years of education: Not on file  . Highest education level: Not on file  Occupational History  . Not on file  Tobacco Use  . Smoking status: Former Smoker    Packs/day: 0.50    Years: 40.00    Pack years: 20.00    Types: Cigarettes    Quit date: 04/18/1997    Years since quitting: 23.0  . Smokeless tobacco: Never Used  Vaping Use  . Vaping Use: Never used  Substance and Sexual Activity  . Alcohol use: Not Currently    Alcohol/week: 50.0 standard drinks    Types: 50 Shots of  liquor per week    Comment: "I mix a couple cups whisky/day"  . Drug use: Not Currently  . Sexual activity: Yes  Other Topics Concern  . Not on file  Social History Narrative  . Not on file   Social Determinants of Health   Financial Resource Strain:   . Difficulty of Paying Living Expenses:   Food Insecurity:   . Worried About Charity fundraiser in the Last Year:   . Arboriculturist in the Last Year:   Transportation Needs:   . Film/video editor (Medical):   Marland Kitchen Lack of Transportation (Non-Medical):   Physical Activity:   . Days of Exercise per Week:   . Minutes of Exercise per Session:   Stress:   . Feeling of Stress :   Social Connections:   . Frequency of Communication with Friends and Family:   . Frequency of Social Gatherings with Friends and Family:   . Attends Religious Services:   . Active Member of Clubs or Organizations:   . Attends Archivist Meetings:   Marland Kitchen Marital Status:   Intimate Partner Violence:   . Fear of Current or Ex-Partner:   . Emotionally Abused:   Marland Kitchen Physically Abused:   . Sexually Abused:      Family History  Problem Relation Age of Onset   . Heart attack Father      Review of Systems Positive for none Negative for: General:  chills, fever, night sweats or weight changes.  Cardiovascular: PND orthopnea syncope dizziness  Dermatological skin lesions rashes Respiratory: Cough congestion Urologic: Frequent urination urination at night and hematuria Abdominal: negative for nausea, vomiting, diarrhea, bright red blood per rectum, melena, or hematemesis Neurologic: negative for visual changes, and/or hearing changes  All other systems reviewed and are otherwise negative except as noted above.  Labs: No results for input(s): CKTOTAL, CKMB, TROPONINI in the last 72 hours. Lab Results  Component Value Date   WBC 5.0 05/07/2020   HGB 8.6 (L) 05/07/2020   HCT 26.9 (L) 05/07/2020   MCV 85.1 05/07/2020   PLT 88 (L) 05/07/2020    Recent Labs  Lab 05/05/20 1405 05/05/20 1849 05/07/20 0717  NA 138   < > 137  K 4.5   < > 4.4  CL 109   < > 106  CO2 20*   < > 23  BUN 33*   < > 33*  CREATININE 1.80*   < > 2.18*  CALCIUM 8.5*   < > 8.2*  PROT 7.4  --   --   BILITOT 1.4*  --   --   ALKPHOS 63  --   --   ALT 11  --   --   AST 22  --   --   GLUCOSE 109*   < > 112*   < > = values in this interval not displayed.   No results found for: CHOL, HDL, LDLCALC, TRIG No results found for: DDIMER  Radiology/Studies:  DG Chest Portable 1 View  Result Date: 05/05/2020 CLINICAL DATA:  Shortness of breath EXAM: PORTABLE CHEST 1 VIEW COMPARISON:  10/29/2018, 06/09/2018 CT 03/10/2019 FINDINGS: Cardiomegaly with vascular congestion and diffuse interstitial opacities suspicious for pulmonary edema. Small bilateral pleural effusions with possible loculation on the left. Volume loss left thorax. Hazy opacity at the left lung base. Aortic atherosclerosis. Chronic left-sided rib fractures. IMPRESSION: 1. Cardiomegaly with vascular congestion and diffuse interstitial opacities suspicious for pulmonary edema. Small bilateral pleural effusions with  possible loculation on the left. 2. Hazy opacity at the left base, may be due to combination of pleural effusion and consolidation/possible round atelectasis noted on prior CT. Chest CT follow-up previously recommended. Electronically Signed   By: Donavan Foil M.D.   On: 05/05/2020 16:44   ECHOCARDIOGRAM COMPLETE  Result Date: 05/06/2020    ECHOCARDIOGRAM REPORT   Patient Name:   Patrick [name] Date of Exam: 05/06/2020 Medical Rec #:  [id number]           Height:       69.0 in Accession #:    [id number]          Weight:       235.2 lb Date of Birth:  15-Oct-1946            BSA:          2.213 m Patient Age:    6 years            BP:           119/44 mmHg Patient Gender: M                   HR:           50 bpm. Exam Location:  [facility] Procedure: 2D Echo, Cardiac Doppler and Color Doppler Indications:     CHF-Acute Diastolic 102.72 / Z36.64  History:         Patient has no prior history of Echocardiogram examinations.                  Arrythmias:Atrial Fibrillation; Risk Factors:Hypertension.  Sonographer:     Alyse Low Roar Referring Phys:  [place] Diagnosing Phys: Neoma Laming MD IMPRESSIONS  1. Left ventricular ejection fraction, by estimation, is 40 to 45%. The left ventricle has mildly decreased function. The left ventricle demonstrates global hypokinesis. The left ventricular internal cavity size was severely dilated. There is mild concentric left ventricular hypertrophy. Left ventricular diastolic parameters are consistent with Grade II diastolic dysfunction (pseudonormalization).  2. Right ventricular systolic function is moderately reduced. The right ventricular size is moderately enlarged. There is moderately elevated pulmonary artery systolic pressure.  3. Left atrial size was severely dilated.  4. Right atrial size was severely dilated.  5. The mitral valve is degenerative. Mild mitral valve regurgitation.  6. The tricuspid valve is myxomatous.  7. The aortic valve is abnormal. Aortic  valve regurgitation is mild. Mild to moderate aortic valve sclerosis/calcification is present, without any evidence of aortic stenosis. FINDINGS  Left Ventricle: Left ventricular ejection fraction, by estimation, is 40 to 45%. The left ventricle has mildly decreased function. The left ventricle demonstrates global hypokinesis. The left ventricular internal cavity size was severely dilated. There is mild concentric left ventricular hypertrophy. Left ventricular diastolic parameters are consistent with Grade II diastolic dysfunction (pseudonormalization). Right Ventricle: The right ventricular size is moderately enlarged. No increase in right ventricular wall thickness. Right ventricular systolic function is moderately reduced. There is moderately elevated pulmonary artery systolic pressure. The tricuspid  regurgitant velocity is 3.26 m/s, and with an assumed right atrial pressure of 10 mmHg, the estimated right ventricular systolic pressure is 40.3 mmHg. Left Atrium: Left atrial size was severely dilated. Right Atrium: Right atrial size was severely dilated. Pericardium: Trivial pericardial effusion is present. Mitral Valve: The mitral valve is degenerative in appearance. Moderate to severe mitral annular calcification. Mild mitral valve regurgitation. Tricuspid Valve: The tricuspid valve is myxomatous. Tricuspid valve regurgitation is mild.  Aortic Valve: The aortic valve is abnormal. Aortic valve regurgitation is mild. Mild to moderate aortic valve sclerosis/calcification is present, without any evidence of aortic stenosis. Aortic valve mean gradient measures 8.0 mmHg. Aortic valve peak gradient measures 15.2 mmHg. Aortic valve area, by VTI measures 1.35 cm. Pulmonic Valve: The pulmonic valve was normal in structure. Pulmonic valve regurgitation is mild. Aorta: The aortic root was not well visualized. IAS/Shunts: The interatrial septum was not well visualized.  LEFT VENTRICLE PLAX 2D LVIDd:         5.08 cm   Diastology LVIDs:         3.79 cm  LV e' lateral:   11.40 cm/s LV PW:         1.15 cm  LV E/e' lateral: 11.6 LV IVS:        1.18 cm  LV e' medial:    8.16 cm/s LVOT diam:     1.70 cm  LV E/e' medial:  16.2 LV SV:         58 LV SV Index:   26 LVOT Area:     2.27 cm  RIGHT VENTRICLE RV Mid diam:    4.35 cm RV S prime:     9.57 cm/s TAPSE (M-mode): 1.5 cm LEFT ATRIUM              Index       RIGHT ATRIUM           Index LA diam:        4.90 cm  2.21 cm/m  RA Area:     24.70 cm LA Vol (A2C):   113.0 ml 51.05 ml/m RA Volume:   78.60 ml  35.51 ml/m LA Vol (A4C):   141.0 ml 63.70 ml/m LA Biplane Vol: 135.0 ml 60.99 ml/m  AORTIC VALVE                    PULMONIC VALVE AV Area (Vmax):    1.23 cm     PV Vmax:        1.09 m/s AV Area (Vmean):   1.20 cm     PV Peak grad:   4.8 mmHg AV Area (VTI):     1.35 cm     RVOT Peak grad: 1 mmHg AV Vmax:           195.00 cm/s AV Vmean:          131.000 cm/s AV VTI:            0.427 m AV Peak Grad:      15.2 mmHg AV Mean Grad:      8.0 mmHg LVOT Vmax:         106.00 cm/s LVOT Vmean:        69.000 cm/s LVOT VTI:          0.254 m LVOT/AV VTI ratio: 0.59  AORTA Ao Root diam: 2.30 cm MITRAL VALVE                TRICUSPID VALVE MV Area (PHT): 4.96 cm     TR Peak grad:   42.5 mmHg MV Decel Time: 153 msec     TR Vmax:        326.00 cm/s MV E velocity: 132.00 cm/s MV A velocity: 45.10 cm/s   SHUNTS MV E/A ratio:  2.93         Systemic VTI:  0.25 m  Systemic Diam: 1.70 cm Neoma Laming MD Electronically signed by Neoma Laming MD Signature Date/Time: 05/06/2020/4:40:30 PM    Final    US LIVER DOPPLER  Result Date: 05/06/2020 CLINICAL DATA:  74 year old male with elevated LFTs, possible cirrhosis EXAM: DUPLEX ULTRASOUND OF LIVER TECHNIQUE: Color and duplex Doppler ultrasound was performed to evaluate the hepatic in-flow and out-flow vessels. COMPARISON:  Right upper quadrant ultrasound performed concurrently FINDINGS: Liver: Please see separately dictated right  upper quadrant ultrasound. Main Portal Vein size: 1.4 cm Portal Vein Velocities Main Prox:  65 cm/sec with normal hepatopetal directional flow. Main Mid: 57 cm/sec with normal hepatopetal directional flow. Main Dist:  47 cm/sec with normal hepatopetal directional flow. Right: 17 cm/sec with normal hepatopetal directional flow. Left: 28 cm/sec with normal hepatopetal directional flow. Hepatic Vein Velocities Right:  29 cm/sec Middle:  24 cm/sec Left:  22 cm/sec IVC: Present and patent with normal respiratory phasicity. Hepatic Artery Velocity:  28 cm/sec Splenic Vein Velocity:  65 cm/sec Spleen: 15.8 cm x 14.4 cm x 5.2 cm with a total volume of 617 cm^3 (411 cm^3 is upper limit normal) Portal Vein Occlusion/Thrombus: No Splenic Vein Occlusion/Thrombus: No Ascites: Perihepatic ascites is present. Varices: None IMPRESSION: 1. Hepatic cirrhosis with portal hypertension as evidenced by splenomegaly and mild ascites. 2. The portal and hepatic veins are patent with normal directional flow. Electronically Signed   By: Jacqulynn Cadet M.D.   On: 05/06/2020 14:30   US Abdomen Limited RUQ  Result Date: 05/06/2020 CLINICAL DATA:  Abnormal LFTs EXAM: ULTRASOUND ABDOMEN LIMITED RIGHT UPPER QUADRANT COMPARISON:  Concurrently obtained hepatic Doppler ultrasound. FINDINGS: Gallbladder: No gallstones or wall thickening visualized. No sonographic Murphy sign noted by sonographer. Common bile duct: Diameter: Normal at 3 mm Liver: Nodular hepatic contour. The hepatic parenchyma is diffusely heterogeneous and coarsened in appearance. No discrete nodule or mass is identified. Portal vein is patent on color Doppler imaging with normal direction of blood flow towards the liver. Other: Trace perihepatic ascites. Partial imaging of the right kidney demonstrates increased echogenicity of the renal parenchyma. IMPRESSION: 1. Hepatic cirrhosis with trace perihepatic ascites. 2. No evidence of cholelithiasis or biliary ductal dilatation. 3.  Incidental note is made of increased echogenicity of the renal parenchyma. Does the patient have underlying medical renal disease? Electronically Signed   By: Jacqulynn Cadet M.D.   On: 05/06/2020 14:28    EKG: Atrial fibrillation with controlled ventricular rate  Weights: Filed Weights   05/05/20 1524 05/05/20 1832 05/07/20 0500  Weight: 106.6 kg 106.7 kg 106.3 kg     Physical Exam: Blood pressure (!) 120/59, pulse (!) 53, temperature 98.4 F (36.9 C), temperature source Oral, resp. rate (!) 27, height 5\' 9"  (1.753 m), weight 106.3 kg, SpO2 100 %. Body mass index is 34.61 kg/m. General: Well developed, well nourished, in no acute distress. Head eyes ears nose throat: Normocephalic, atraumatic, sclera non-icteric, no xanthomas, nares are without discharge. No apparent thyromegaly and/or mass  Lungs: Normal respiratory effort.  Few wheezes, no rales, no rhonchi.  Heart: Irregular with normal S1 S2. no murmur gallop, no rub, PMI is normal size and placement, carotid upstroke normal without bruit, jugular venous pressure is normal Abdomen: Soft, non-tender, non-distended with normoactive bowel sounds. No hepatomegaly. No rebound/guarding. No obvious abdominal masses. Abdominal aorta is normal size without bruit Extremities: Trace edema. no cyanosis, no clubbing, no ulcers  Peripheral : 2+ bilateral upper extremity pulses, 2+ bilateral femoral pulses, 2+ bilateral dorsal pedal pulse Neuro: Alert and oriented.  No facial asymmetry. No focal deficit. Moves all extremities spontaneously. Musculoskeletal: Normal muscle tone without kyphosis Psych:  Responds to questions appropriately with a normal affect.    Assessment: 74 year old male with mild to moderate valvular heart disease chronic nonvalvular atrial fibrillation cardiomegaly with pulmonary edema likely secondary to acute anemia from GI bleed and chronic kidney disease with no current evidence of acute coronary syndrome now stabilized  with appropriate supportive care and medication management at lowest risk possible for further evaluation of GI bleed including upper endoscopy and colonoscopy  Plan: 1.  Proceed to endoscopy colonoscopy without restriction to assess for causes of GI bleed and further treatment thereof is necessary 2.  No anticoagulation for atrial fibrillation due to GI bleed and possible esophageal varices 3.  Continue atenolol at low dose for heart rate control of atrial fibrillation with no change today 4.  No further cardiac diagnostics necessary at this time due to previous assessments unchanged and no new acute coronary syndrome or concerns 5.  Continue furosemide for pulmonary edema as necessary 6.  Further treatment options after above  Signed, Corey Skains M.D. [facility] 05/07/2020, 10:49 AM

## 2020-05-07 NOTE — Progress Notes (Signed)
Patient has asymptomatic atrial fibrillation with heart rates ranging from 32-55 bpm. Blood pressure and mentation has been stable. Mid level provider notified. EKG and Echo performed on previous shift to evaluate.

## 2020-05-07 NOTE — Progress Notes (Signed)
Patrick [name] , MD [address] [place], [place], [place], [address] [address], [place], [place], [place], [address] Phone: [phone]  Fax: [phone]   Patrick [name] is being followed for iron deficiency anemia and GI bleed  Day 1 of follow up   Subjective: Doing well , breathing better, denies any bleeding    Objective: Vital signs in last 24 hours: Vitals:   05/07/20 0700 05/07/20 0800 05/07/20 0900 05/07/20 0942  BP: (!) 121/52 (!) 129/51 (!) 120/59   Pulse: (!) 48 (!) 32 (!) 53   Resp: (!) 23 (!) 27 (!) 27   Temp:  98.4 F (36.9 C)    TempSrc:  Oral    SpO2: 100% 99% 100% 100%  Weight:      Height:       Weight change: -0.295 kg  Intake/Output Summary (Last 24 hours) at 05/07/2020 0946 Last data filed at 05/07/2020 0814 Gross per 24 hour  Intake 2179.96 ml  Output 3050 ml  Net -870.04 ml     Exam: Heart:: Regular rate and rhythm, S1S2 present or without murmur or extra heart sounds Lungs: normal, clear to auscultation and clear to auscultation and percussion Abdomen: soft, nontender, normal bowel sounds   Lab Results: @LABTEST2 @ Micro Results: Recent Results (from the past 240 hour(s))  SARS Coronavirus 2 by RT PCR (hospital order, performed in [facility] hospital lab) Nasopharyngeal Nasopharyngeal Swab     Status: None   Collection Time: 05/05/20  4:23 PM   Specimen: Nasopharyngeal Swab  Result Value Ref Range Status   SARS Coronavirus 2 NEGATIVE NEGATIVE Final    Comment: (NOTE) SARS-CoV-2 target nucleic acids are NOT DETECTED.  The SARS-CoV-2 RNA is generally detectable in upper and lower respiratory specimens during the acute phase of infection. The lowest concentration of SARS-CoV-2 viral copies this assay can detect is 250 copies / mL. A negative result does not preclude SARS-CoV-2 infection and should not be used as the sole basis for treatment or other patient management decisions.  A negative result may occur with improper  specimen collection / handling, submission of specimen other than nasopharyngeal swab, presence of viral mutation(s) within the areas targeted by this assay, and inadequate number of viral copies (<250 copies / mL). A negative result must be combined with clinical observations, patient history, and epidemiological information.  Fact Sheet for Patients:   StrictlyIdeas.no  Fact Sheet for Healthcare Providers: BankingDealers.co.za  This test is not yet approved or  cleared by the [place] FDA and has been authorized for detection and/or diagnosis of SARS-CoV-2 by FDA under an Emergency Use Authorization (EUA).  This EUA will remain in effect (meaning this test can be used) for the duration of the COVID-19 declaration under Section 564(b)(1) of the Act, 21 U.S.C. section 360bbb-3(b)(1), unless the authorization is terminated or revoked sooner.  Performed at [facility], [address] [place], [place] [address]   MRSA PCR Screening     Status: None   Collection Time: 05/05/20  6:30 PM   Specimen: Nasal Mucosa; Nasopharyngeal  Result Value Ref Range Status   MRSA by PCR NEGATIVE NEGATIVE Final    Comment:        The GeneXpert MRSA Assay (FDA approved for NASAL specimens only), is one component of a comprehensive MRSA colonization surveillance program. It is not intended to diagnose MRSA infection nor to guide or monitor treatment for MRSA infections. Performed at [facility], [address] [place],  [place] [address]    Studies/Results: DG Chest Portable 1 View  Result Date: 05/05/2020 CLINICAL DATA:  Shortness of breath EXAM: PORTABLE CHEST 1 VIEW COMPARISON:  10/29/2018, 06/09/2018 CT 03/10/2019 FINDINGS: Cardiomegaly with vascular congestion and diffuse interstitial opacities suspicious for pulmonary edema. Small bilateral pleural effusions with possible loculation on the left. Volume loss left  thorax. Hazy opacity at the left lung base. Aortic atherosclerosis. Chronic left-sided rib fractures. IMPRESSION: 1. Cardiomegaly with vascular congestion and diffuse interstitial opacities suspicious for pulmonary edema. Small bilateral pleural effusions with possible loculation on the left. 2. Hazy opacity at the left base, may be due to combination of pleural effusion and consolidation/possible round atelectasis noted on prior CT. Chest CT follow-up previously recommended. Electronically Signed   By: Donavan Foil M.D.   On: 05/05/2020 16:44   ECHOCARDIOGRAM COMPLETE  Result Date: 05/06/2020    ECHOCARDIOGRAM REPORT   Patient Name:   Patrick [name] Date of Exam: 05/06/2020 Medical Rec #:  [id number]           Height:       69.0 in Accession #:    [id number]          Weight:       235.2 lb Date of Birth:  1945-12-27            BSA:          2.213 m Patient Age:    74 years            BP:           119/44 mmHg Patient Gender: M                   HR:           50 bpm. Exam Location:  [facility] Procedure: 2D Echo, Cardiac Doppler and Color Doppler Indications:     CHF-Acute Diastolic 696.78 / L38.10  History:         Patient has no prior history of Echocardiogram examinations.                  Arrythmias:Atrial Fibrillation; Risk Factors:Hypertension.  Sonographer:     Alyse Low Roar Referring Phys:  [place] Diagnosing Phys: Neoma Laming MD IMPRESSIONS  1. Left ventricular ejection fraction, by estimation, is 40 to 45%. The left ventricle has mildly decreased function. The left ventricle demonstrates global hypokinesis. The left ventricular internal cavity size was severely dilated. There is mild concentric left ventricular hypertrophy. Left ventricular diastolic parameters are consistent with Grade II diastolic dysfunction (pseudonormalization).  2. Right ventricular systolic function is moderately reduced. The right ventricular size is moderately enlarged. There is moderately elevated pulmonary  artery systolic pressure.  3. Left atrial size was severely dilated.  4. Right atrial size was severely dilated.  5. The mitral valve is degenerative. Mild mitral valve regurgitation.  6. The tricuspid valve is myxomatous.  7. The aortic valve is abnormal. Aortic valve regurgitation is mild. Mild to moderate aortic valve sclerosis/calcification is present, without any evidence of aortic stenosis. FINDINGS  Left Ventricle: Left ventricular ejection fraction, by estimation, is 40 to 45%. The left ventricle has mildly decreased function. The left ventricle demonstrates global hypokinesis. The left ventricular internal cavity size was severely dilated. There is mild concentric left ventricular hypertrophy. Left ventricular diastolic parameters are consistent with Grade II diastolic dysfunction (pseudonormalization). Right Ventricle: The right ventricular size is moderately enlarged. No increase in right ventricular wall thickness. Right ventricular systolic  function is moderately reduced. There is moderately elevated pulmonary artery systolic pressure. The tricuspid  regurgitant velocity is 3.26 m/s, and with an assumed right atrial pressure of 10 mmHg, the estimated right ventricular systolic pressure is 70.2 mmHg. Left Atrium: Left atrial size was severely dilated. Right Atrium: Right atrial size was severely dilated. Pericardium: Trivial pericardial effusion is present. Mitral Valve: The mitral valve is degenerative in appearance. Moderate to severe mitral annular calcification. Mild mitral valve regurgitation. Tricuspid Valve: The tricuspid valve is myxomatous. Tricuspid valve regurgitation is mild. Aortic Valve: The aortic valve is abnormal. Aortic valve regurgitation is mild. Mild to moderate aortic valve sclerosis/calcification is present, without any evidence of aortic stenosis. Aortic valve mean gradient measures 8.0 mmHg. Aortic valve peak gradient measures 15.2 mmHg. Aortic valve area, by VTI measures 1.35  cm. Pulmonic Valve: The pulmonic valve was normal in structure. Pulmonic valve regurgitation is mild. Aorta: The aortic root was not well visualized. IAS/Shunts: The interatrial septum was not well visualized.  LEFT VENTRICLE PLAX 2D LVIDd:         5.08 cm  Diastology LVIDs:         3.79 cm  LV e' lateral:   11.40 cm/s LV PW:         1.15 cm  LV E/e' lateral: 11.6 LV IVS:        1.18 cm  LV e' medial:    8.16 cm/s LVOT diam:     1.70 cm  LV E/e' medial:  16.2 LV SV:         58 LV SV Index:   26 LVOT Area:     2.27 cm  RIGHT VENTRICLE RV Mid diam:    4.35 cm RV S prime:     9.57 cm/s TAPSE (M-mode): 1.5 cm LEFT ATRIUM              Index       RIGHT ATRIUM           Index LA diam:        4.90 cm  2.21 cm/m  RA Area:     24.70 cm LA Vol (A2C):   113.0 ml 51.05 ml/m RA Volume:   78.60 ml  35.51 ml/m LA Vol (A4C):   141.0 ml 63.70 ml/m LA Biplane Vol: 135.0 ml 60.99 ml/m  AORTIC VALVE                    PULMONIC VALVE AV Area (Vmax):    1.23 cm     PV Vmax:        1.09 m/s AV Area (Vmean):   1.20 cm     PV Peak grad:   4.8 mmHg AV Area (VTI):     1.35 cm     RVOT Peak grad: 1 mmHg AV Vmax:           195.00 cm/s AV Vmean:          131.000 cm/s AV VTI:            0.427 m AV Peak Grad:      15.2 mmHg AV Mean Grad:      8.0 mmHg LVOT Vmax:         106.00 cm/s LVOT Vmean:        69.000 cm/s LVOT VTI:          0.254 m LVOT/AV VTI ratio: 0.59  AORTA Ao Root diam: 2.30 cm MITRAL VALVE  TRICUSPID VALVE MV Area (PHT): 4.96 cm     TR Peak grad:   42.5 mmHg MV Decel Time: 153 msec     TR Vmax:        326.00 cm/s MV E velocity: 132.00 cm/s MV A velocity: 45.10 cm/s   SHUNTS MV E/A ratio:  2.93         Systemic VTI:  0.25 m                             Systemic Diam: 1.70 cm Neoma Laming MD Electronically signed by Neoma Laming MD Signature Date/Time: 05/06/2020/4:40:30 PM    Final    US LIVER DOPPLER  Result Date: 05/06/2020 CLINICAL DATA:  74 year old male with elevated LFTs, possible cirrhosis EXAM:  DUPLEX ULTRASOUND OF LIVER TECHNIQUE: Color and duplex Doppler ultrasound was performed to evaluate the hepatic in-flow and out-flow vessels. COMPARISON:  Right upper quadrant ultrasound performed concurrently FINDINGS: Liver: Please see separately dictated right upper quadrant ultrasound. Main Portal Vein size: 1.4 cm Portal Vein Velocities Main Prox:  65 cm/sec with normal hepatopetal directional flow. Main Mid: 57 cm/sec with normal hepatopetal directional flow. Main Dist:  47 cm/sec with normal hepatopetal directional flow. Right: 17 cm/sec with normal hepatopetal directional flow. Left: 28 cm/sec with normal hepatopetal directional flow. Hepatic Vein Velocities Right:  29 cm/sec Middle:  24 cm/sec Left:  22 cm/sec IVC: Present and patent with normal respiratory phasicity. Hepatic Artery Velocity:  28 cm/sec Splenic Vein Velocity:  65 cm/sec Spleen: 15.8 cm x 14.4 cm x 5.2 cm with a total volume of 617 cm^3 (411 cm^3 is upper limit normal) Portal Vein Occlusion/Thrombus: No Splenic Vein Occlusion/Thrombus: No Ascites: Perihepatic ascites is present. Varices: None IMPRESSION: 1. Hepatic cirrhosis with portal hypertension as evidenced by splenomegaly and mild ascites. 2. The portal and hepatic veins are patent with normal directional flow. Electronically Signed   By: Jacqulynn Cadet M.D.   On: 05/06/2020 14:30   US Abdomen Limited RUQ  Result Date: 05/06/2020 CLINICAL DATA:  Abnormal LFTs EXAM: ULTRASOUND ABDOMEN LIMITED RIGHT UPPER QUADRANT COMPARISON:  Concurrently obtained hepatic Doppler ultrasound. FINDINGS: Gallbladder: No gallstones or wall thickening visualized. No sonographic Murphy sign noted by sonographer. Common bile duct: Diameter: Normal at 3 mm Liver: Nodular hepatic contour. The hepatic parenchyma is diffusely heterogeneous and coarsened in appearance. No discrete nodule or mass is identified. Portal vein is patent on color Doppler imaging with normal direction of blood flow towards the  liver. Other: Trace perihepatic ascites. Partial imaging of the right kidney demonstrates increased echogenicity of the renal parenchyma. IMPRESSION: 1. Hepatic cirrhosis with trace perihepatic ascites. 2. No evidence of cholelithiasis or biliary ductal dilatation. 3. Incidental note is made of increased echogenicity of the renal parenchyma. Does the patient have underlying medical renal disease? Electronically Signed   By: Jacqulynn Cadet M.D.   On: 05/06/2020 14:28   Medications: I have reviewed the patient's current medications. Scheduled Meds: . sodium chloride   Intravenous Once  . sodium chloride   Intravenous Once  . atenolol  25 mg Oral BID  . Chlorhexidine Gluconate Cloth  6 each Topical Daily  . docusate sodium  100 mg Oral BID  . furosemide  40 mg Intravenous Q12H  . insulin aspart  0-9 Units Subcutaneous TID WC  . melatonin  2.5 mg Oral QHS  . multivitamin with minerals  1 tablet Oral Daily  . simvastatin  40 mg Oral  Daily  . sodium chloride flush  3 mL Intravenous Q12H   Continuous Infusions: . sodium chloride    . octreotide  (SANDOSTATIN)    IV infusion 50 mcg/hr (05/07/20 0932)  . pantoprozole (PROTONIX) infusion 8 mg/hr (05/07/20 0935)  . pantoprazole (PROTONIX) IVPB     PRN Meds:.albuterol, hydrALAZINE, HYDROmorphone (DILAUDID) injection, levalbuterol, senna-docusate, sorbitol   Assessment: Principal Problem:   GIB (gastrointestinal bleeding) Active Problems:   Absolute anemia   Arterial vascular disease   Diabetes (HCC)   Acid reflux   Fecal occult blood test positive   HLD (hyperlipidemia)   Arthritis, degenerative  Patrick [name] is a 74 y.o. 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  Follows with hematology and has received IV iron in the past.  Quit alcohol in 2019.  Admitted with weakness and found to be anemic.  Noted drop in hemoglobin from  baseline with severe iron deficiency and history of melena.  Being evaluated for heart failure with an elevated BNP.  Also has AKI.  INR is 1.6.Less likely an variceal bleed due to no history of hematemesis and no elevation in BUN/creatinine ratio. Overnight worsening on creatinine . Very likely bleeding from AVMs that were previously seen.Portal veins patent on doppler    Plan 1.  IV iron, check B12, folate and urine analysis for blood loss. Follow up vitamin C levels . Monitor CBC and transfuse as needed.  2. His INR is 1.6 and he has a history of easy bruising.  Check vitamin D level and administer 10 mg of vitamin K subcutaneously once daily for 3 days unless there is any other reason not to do so.  I do not believe he is on any anticoagulation.  3.  Cardiology has cleared him and will get  EGD tomorrow  and if negative will proceed with a colonoscopy.  He will very likely need capsule study of the small bowel as an outpatient due to iron deficiency anemia and history of AVMs.  4.  Low-sodium diet less than 2 g of sodium per day suggest dietitian consult as increased sodium intake can worsen heart failure and ascites.  5. Needs antibiotics for prophylaxsis in view of a cirrhotic and GI bleed,  6. Continue octreotide and PPI  I have discussed alternative options, risks & benefits,  which include, but are not limited to, bleeding, infection, perforation,respiratory complication & drug reaction.  The patient agrees with this plan & written consent will be obtained.      LOS: 2 days   Patrick Bellows, MD 05/07/2020, 9:46 AM

## 2020-05-07 NOTE — Evaluation (Signed)
Occupational Therapy Evaluation Patient Details Name: Patrick [name] MRN: [id number] DOB: 06/14/1946 Today's Date: 05/07/2020    History of Present Illness Patrick [name] is a 74 y.o. male with medical history significant of   iron deficiency anemia, thrombocytopenia, liver cirrhosis/splenomegal, (history of alcohol abuse, tobacco abuse), hypertension, hyperlipidemia, diabetes mellitus type II, GERD, CAD. Presented with generalized weakness, shortness of breath-noted to be severely anemic at his oncology follow-up visit today.   Clinical Impression   Patrick [name] was seen for OT evaluation this date. Prior to hospital admission, pt was Independent c mobility and ADLs but reports sponge bathing at sink and decreased household distance ambulation. Pt lives c wife and reports history of 1 fall this year when he recently fel out of bed while sleeping resulting in LUE weakness. Pt presents to acute OT demonstrating impaired ADL performance and functional mobility 2/2 functional ROM/strength/balance deficits, decreased activity tolerance, and decreased LB access. Pt currently requires MOD I self-feeding using RUE - anticipate difficulty eating two handed meals. Simulated hair washing pt unable to reach LUE to back of head - anticipate MIN A for UB bathing. CGA adjust B socks seated EOB - anticipate assist needed for LBD. Pt would benefit from skilled OT to address noted impairments and functional limitations (see below for any additional details) in order to maximize safety and independence while minimizing falls risk and caregiver burden. Upon hospital discharge, recommend HHOT to maximize pt safety and return to functional independence during meaningful occupations of daily life.     Follow Up Recommendations  Home health OT;Supervision/Assistance - 24 hour    Equipment Recommendations  3 in 1 bedside commode    Recommendations for Other Services       Precautions / Restrictions  Precautions Precautions: Fall Restrictions Weight Bearing Restrictions: No      Mobility Bed Mobility Overal bed mobility: Needs Assistance Bed Mobility: Supine to Sit;Sit to Supine     Supine to sit: Min assist;HOB elevated Sit to supine: Min guard;HOB elevated      Transfers                 General transfer comment: Pt deferred this date    Balance Overall balance assessment: Needs assistance Sitting-balance support: Single extremity supported;Feet supported Sitting balance-Leahy Scale: Fair                                     ADL either performed or assessed with clinical judgement   ADL Overall ADL's : Needs assistance/impaired                                       General ADL Comments: MOD I self-feeding using RUE - anticipate difficulty eating two handed meals. CGA adjust B socks seated EOB - anticipate assist needed for LBD. Anticipate assist needed to bathe RUE using LUE.      Vision         Perception     Praxis      Pertinent Vitals/Pain Pain Assessment: No/denies pain     Hand Dominance Right   Extremity/Trunk Assessment Upper Extremity Assessment Upper Extremity Assessment: LUE deficits/detail LUE Deficits / Details: Deltoid 2-/5 pt unable to raise LUE w/o assist. Biceps, triceps, and grip 4/5 grossly    Lower Extremity Assessment Lower Extremity Assessment: Generalized weakness  Communication Communication Communication: No difficulties   Cognition Arousal/Alertness: Awake/alert Behavior During Therapy: WFL for tasks assessed/performed Overall Cognitive Status: Within Functional Limits for tasks assessed                                     General Comments  HR 40s, 50s t/o session. RN in room t/o mobility half of session     Exercises Exercises: Other exercises Other Exercises Other Exercises: Pt educated re: OT role, DME recs, d/c recs, ECS, falls prevention Other  Exercises: LBD, self-feeding, simulated grooming, sup<>sit, sitting balance/tolerance, bridging bed level    Shoulder Instructions      Home Living Family/patient expects to be discharged to:: Private residence Living Arrangements: Spouse/significant other Available Help at Discharge: Family;Available 24 hours/day;Available PRN/intermittently (wife available 24/7; DTR assist PRN after work) Type of Home: House Home Access: Stairs to enter Technical brewer of Steps: 3 Entrance Stairs-Rails: Can reach both Home Layout: One level     Bathroom Shower/Tub: Teacher, early years/pre: Standard     Home Equipment: Toilet riser   Additional Comments: Pt reports sponge bathing at sink       Prior Functioning/Environment Level of Independence: Independent        Comments: Pt reports sponge bathing at sink, 1 fall this year where pt recently fell out of bed. Pt reports using no AD for mobility but does not go farther than bathroom         OT Problem List: Decreased strength;Decreased range of motion;Decreased activity tolerance;Impaired balance (sitting and/or standing);Impaired UE functional use      OT Treatment/Interventions: Self-care/ADL training;Therapeutic exercise;Energy conservation;DME and/or AE instruction;Therapeutic activities;Balance training;Patient/family education    OT Goals(Current goals can be found in the care plan section) Acute Rehab OT Goals Patient Stated Goal: To breathe easier OT Goal Formulation: With patient Time For Goal Achievement: 05/21/20 Potential to Achieve Goals: Good ADL Goals Pt Will Perform Eating: with modified independence;sitting (c no cues for LUE integration) Pt Will Perform Lower Body Dressing: with set-up;with supervision;sit to/from stand (c LRAD PRN) Pt Will Transfer to Toilet: with supervision;ambulating (c LRAD PRN) Additional ADL Goal #1: Pt will Independently verbalize plan to implement x3 ECS  OT Frequency: Min  1X/week   Barriers to D/C: Inaccessible home environment          Co-evaluation              AM-PAC OT "6 Clicks" Daily Activity     Outcome Measure Help from another person eating meals?: None Help from another person taking care of personal grooming?: A Little Help from another person toileting, which includes using toliet, bedpan, or urinal?: A Little Help from another person bathing (including washing, rinsing, drying)?: A Little Help from another person to put on and taking off regular upper body clothing?: A Little Help from another person to put on and taking off regular lower body clothing?: A Little 6 Click Score: 19   End of Session Equipment Utilized During Treatment: Oxygen Nurse Communication: Mobility status  Activity Tolerance: Patient tolerated treatment well Patient left: in bed;with call bell/phone within reach  OT Visit Diagnosis: Other abnormalities of gait and mobility (R26.89)                Time: 7169-6789 OT Time Calculation (min): 25 min Charges:  OT General Charges $OT Visit: 1 Visit OT Evaluation $OT Eval Moderate Complexity: 1  Mod OT Treatments $Self Care/Home Management : 8-22 mins  Dessie Coma, M.S. OTR/L  05/07/20, 12:10 PM

## 2020-05-07 NOTE — Progress Notes (Signed)
PROGRESS NOTE    Patrick [name]    Code Status: Full Code  HGD:[id number] DOB: 1946-08-11 DOA: 05/05/2020 LOS: 2 days  PCP: Idelle Crouch, MD CC:  Chief Complaint  Patient presents with  . Anemia       Hospital Summary   He is a pleasant 74 year old male with past medical history of iron deficiency anemia, thrombocytopenia, liver cirrhosis, splenomegaly, alcohol abuse, previous GI bleed, distant tobacco abuse, hypertension, hyperlipidemia, type 2 diabetes, GERD, CAD who presented with generalized weakness, shortness of breath x2 weeks and noted to be severely anemic with Hb 6.0 at his oncology follow-up visit and sent to the ED.  Reportedly had progressive weakness with malaise, nausea vomiting diarrhea and melena.  He was admitted to the stepdown unit with concern for GI bleed and GI was consulted.  Started on octreotide and PPI drip. Also noted to be in A. fib with SVR.  Echo was obtained which showed EF 40 to 45% hypokinesis, severe biatrial dilation, and grade 2 diastolic dysfunction currently reduced RV systolic function started on treatment for heart failure exacerbation.  Cardiology was consulted for cardiac clearance prior to EGD who cleared patient for procedure.    A & P   Principal Problem:   GIB (gastrointestinal bleeding) Active Problems:   Absolute anemia   Arterial vascular disease   Diabetes (HCC)   Acid reflux   Fecal occult blood test positive   HLD (hyperlipidemia)   Arthritis, degenerative   1. Acute blood loss anemia secondary to acute GI bleed, suspected AVMs versus less likely variceal bleed a. Hb 6.3 on presentation, S/p 2 unit PRBCs, now 8.6 b. Cardiology consulted for cardiac clearance, cleared for GI procedures c. Octreotide and PPI drip d. Start ceftriaxone for SBP prophylaxis in cirrhotic e. GI consulted, plan for EGD and possible colonoscopy tomorrow.  May need capsule study of small bowel  2. Acute hypoxic respiratory failure secondary to  diastolic heart failure exacerbation a. Echo 7/11: EF 40 to 45% with mildly decreased LV systolic function, LV global hypokinesis with grade 2 diastolic dysfunction and reduced RV systolic function as well as severely dilated right and left atria and myxomatous TV, mild AR b. Continue Lasix c. Low-sodium diet d. I/O and daily weights  3. Atrial fibrillation with SVR a. Not on anticoagulation due to current and history of GI bleed b. Continue atenolol at low-dose for HR control per cardiology c. We will give IV magnesium for goal greater than 2.0  4. Iron deficiency anemia a. Gets IV iron outpatient b. Ferritin 22 c. Given IV iron d. GI studies as above  5. History of cirrhosis a. Meld score 21: Estimated 19.6% 102-month mortality b. Lasix as above c. GI on board  6. Supratherapeutic INR a. 1.7, likely from cirrhosis  7. AKI, Multifactorial GI bleed, heart failure a. Received blood transfusion b. Diuresis for now follow-up in a.m.   DVT prophylaxis: SCDs Start: 05/05/20 1635 Place TED hose Start: 05/05/20 1635   Family Communication: no family at bedside  Disposition Plan:  Status is: Inpatient  Remains inpatient appropriate because:Inpatient level of care appropriate due to severity of illness   Dispo: The patient is from: Home              Anticipated d/c is to: TBD              Anticipated d/c date is: 3 days              Patient  currently is not medically stable to d/c.          Pressure injury documentation    None  Consultants  GI Cardio  Procedures  None  Antibiotics   Anti-infectives (From admission, onward)   Start     Dose/Rate Route Frequency Ordered Stop   05/07/20 1000  cefTRIAXone (ROCEPHIN) 1 g in sodium chloride 0.9 % 100 mL IVPB     Discontinue     1 g 200 mL/hr over 30 Minutes Intravenous Every 24 hours 05/07/20 0954     05/05/20 1630  cefTRIAXone (ROCEPHIN) 1 g in sodium chloride 0.9 % 100 mL IVPB        1 g 200 mL/hr over 30  Minutes Intravenous  Once 05/05/20 1619 05/05/20 1740        Subjective   Very pleasant. Reports he feels a bit better. Admits to lower extremity swelling. Still with shortness of breath. Admits to a distant history of tobacco use. No other issues overnight or complaints.   Objective   Vitals:   05/07/20 1100 05/07/20 1200 05/07/20 1300 05/07/20 1400  BP: (!) 126/57 (!) 112/45 (!) 108/58 (!) 115/53  Pulse: 60 (!) 53 (!) 54 (!) 39  Resp: 19 (!) 22 (!) 21 20  Temp:      TempSrc:      SpO2: 97% 100% 100% 98%  Weight:      Height:        Intake/Output Summary (Last 24 hours) at 05/07/2020 1503 Last data filed at 05/07/2020 1435 Gross per 24 hour  Intake 1284.93 ml  Output 2100 ml  Net -815.07 ml   Filed Weights   05/05/20 1524 05/05/20 1832 05/07/20 0500  Weight: 106.6 kg 106.7 kg 106.3 kg    Examination:  Physical Exam Vitals and nursing note reviewed.  Constitutional:      Appearance: Normal appearance.  HENT:     Head: Normocephalic and atraumatic.  Eyes:     Conjunctiva/sclera: Conjunctivae normal.  Cardiovascular:     Rate and Rhythm: Bradycardia present. Rhythm irregular.     Heart sounds: Murmur heard.  Systolic murmur is present with a grade of 3/6.   Pulmonary:     Effort: Pulmonary effort is normal. No respiratory distress.     Breath sounds: Rales present.  Abdominal:     General: Abdomen is flat.     Palpations: Abdomen is soft.  Musculoskeletal:        General: No tenderness.     Right lower leg: 2+ Pitting Edema present.     Left lower leg: 2+ Pitting Edema present.  Skin:    Coloration: Skin is not jaundiced or pale.  Neurological:     Mental Status: He is alert. Mental status is at baseline.  Psychiatric:        Mood and Affect: Mood normal.        Behavior: Behavior normal.     Data Reviewed: I have personally reviewed following labs and imaging studies  CBC: Recent Labs  Lab 05/05/20 1405 05/06/20 0525 05/06/20 0900  05/06/20 1743 05/07/20 0717  WBC 4.7 4.4  --   --  5.0  NEUTROABS 3.3  --   --   --   --   HGB 6.3* 7.1* 7.4* 8.3* 8.6*  HCT 20.2* 22.9* 23.7* 26.3* 26.9*  MCV 83.5 84.5  --   --  85.1  PLT 101* 90*  --   --  88*   Basic Metabolic Panel: Recent Labs  Lab 05/05/20 1405 05/05/20 1849 05/06/20 0525 05/07/20 0717  NA 138  --  138 137  K 4.5  --  5.0 4.4  CL 109  --  108 106  CO2 20*  --  22 23  GLUCOSE 109*  --  113* 112*  BUN 33*  --  33* 33*  CREATININE 1.80*  --  1.81* 2.18*  CALCIUM 8.5* 8.7* 8.4* 8.2*  MG  --  1.9  --  1.7  PHOS  --  4.1  --   --    GFR: Estimated Creatinine Clearance: 35.7 mL/min (A) (by C-G formula based on SCr of 2.18 mg/dL (H)). Liver Function Tests: Recent Labs  Lab 05/05/20 1405  AST 22  ALT 11  ALKPHOS 63  BILITOT 1.4*  PROT 7.4  ALBUMIN 3.1*   No results for input(s): LIPASE, AMYLASE in the last 168 hours. No results for input(s): AMMONIA in the last 168 hours. Coagulation Profile: Recent Labs  Lab 05/05/20 1606 05/06/20 0525  INR 1.6* 1.7*   Cardiac Enzymes: No results for input(s): CKTOTAL, CKMB, CKMBINDEX, TROPONINI in the last 168 hours. BNP (last 3 results) No results for input(s): PROBNP in the last 8760 hours. HbA1C: Recent Labs    05/05/20 1849  HGBA1C 4.7*   CBG: Recent Labs  Lab 05/06/20 1154 05/06/20 1604 05/06/20 2228 05/07/20 0745 05/07/20 1152  GLUCAP 120* 123* 155* 106* 141*   Lipid Profile: No results for input(s): CHOL, HDL, LDLCALC, TRIG, CHOLHDL, LDLDIRECT in the last 72 hours. Thyroid Function Tests: Recent Labs    05/05/20 1849  TSH 2.701   Anemia Panel: Recent Labs    05/05/20 1405  FERRITIN 22*  TIBC 437  IRON 21*   Sepsis Labs: No results for input(s): PROCALCITON, LATICACIDVEN in the last 168 hours.  Recent Results (from the past 240 hour(s))  SARS Coronavirus 2 by RT PCR (hospital order, performed in [facility] hospital lab) Nasopharyngeal Nasopharyngeal Swab     Status: None    Collection Time: 05/05/20  4:23 PM   Specimen: Nasopharyngeal Swab  Result Value Ref Range Status   SARS Coronavirus 2 NEGATIVE NEGATIVE Final    Comment: (NOTE) SARS-CoV-2 target nucleic acids are NOT DETECTED.  The SARS-CoV-2 RNA is generally detectable in upper and lower respiratory specimens during the acute phase of infection. The lowest concentration of SARS-CoV-2 viral copies this assay can detect is 250 copies / mL. A negative result does not preclude SARS-CoV-2 infection and should not be used as the sole basis for treatment or other patient management decisions.  A negative result may occur with improper specimen collection / handling, submission of specimen other than nasopharyngeal swab, presence of viral mutation(s) within the areas targeted by this assay, and inadequate number of viral copies (<250 copies / mL). A negative result must be combined with clinical observations, patient history, and epidemiological information.  Fact Sheet for Patients:   StrictlyIdeas.no  Fact Sheet for Healthcare Providers: BankingDealers.co.za  This test is not yet approved or  cleared by the [place] FDA and has been authorized for detection and/or diagnosis of SARS-CoV-2 by FDA under an Emergency Use Authorization (EUA).  This EUA will remain in effect (meaning this test can be used) for the duration of the COVID-19 declaration under Section 564(b)(1) of the Act, 21 U.S.C. section 360bbb-3(b)(1), unless the authorization is terminated or revoked sooner.  Performed at [facility], [address], [place], [place] [address]   MRSA PCR Screening  Status: None   Collection Time: 05/05/20  6:30 PM   Specimen: Nasal Mucosa; Nasopharyngeal  Result Value Ref Range Status   MRSA by PCR NEGATIVE NEGATIVE Final    Comment:        The GeneXpert MRSA Assay (FDA approved for NASAL specimens only), is one component of  a comprehensive MRSA colonization surveillance program. It is not intended to diagnose MRSA infection nor to guide or monitor treatment for MRSA infections. Performed at [facility], [address] [place], [place] [address]          Radiology Studies: DG Chest Portable 1 View  Result Date: 05/05/2020 CLINICAL DATA:  Shortness of breath EXAM: PORTABLE CHEST 1 VIEW COMPARISON:  10/29/2018, 06/09/2018 CT 03/10/2019 FINDINGS: Cardiomegaly with vascular congestion and diffuse interstitial opacities suspicious for pulmonary edema. Small bilateral pleural effusions with possible loculation on the left. Volume loss left thorax. Hazy opacity at the left lung base. Aortic atherosclerosis. Chronic left-sided rib fractures. IMPRESSION: 1. Cardiomegaly with vascular congestion and diffuse interstitial opacities suspicious for pulmonary edema. Small bilateral pleural effusions with possible loculation on the left. 2. Hazy opacity at the left base, may be due to combination of pleural effusion and consolidation/possible round atelectasis noted on prior CT. Chest CT follow-up previously recommended. Electronically Signed   By: Donavan Foil M.D.   On: 05/05/2020 16:44   ECHOCARDIOGRAM COMPLETE  Result Date: 05/06/2020    ECHOCARDIOGRAM REPORT   Patient Name:   TAYVIAN HOLYCROSS Date of Exam: 05/06/2020 Medical Rec #:  [id number]           Height:       69.0 in Accession #:    [id number]          Weight:       235.2 lb Date of Birth:  11/14/1945            BSA:          2.213 m Patient Age:    68 years            BP:           119/44 mmHg Patient Gender: M                   HR:           50 bpm. Exam Location:  [facility] Procedure: 2D Echo, Cardiac Doppler and Color Doppler Indications:     CHF-Acute Diastolic 237.62 / G31.51  History:         Patient has no prior history of Echocardiogram examinations.                  Arrythmias:Atrial Fibrillation; Risk Factors:Hypertension.  Sonographer:     Alyse Low  Roar Referring Phys:  [facility] [facility] Diagnosing Phys: Neoma Laming MD IMPRESSIONS  1. Left ventricular ejection fraction, by estimation, is 40 to 45%. The left ventricle has mildly decreased function. The left ventricle demonstrates global hypokinesis. The left ventricular internal cavity size was severely dilated. There is mild concentric left ventricular hypertrophy. Left ventricular diastolic parameters are consistent with Grade II diastolic dysfunction (pseudonormalization).  2. Right ventricular systolic function is moderately reduced. The right ventricular size is moderately enlarged. There is moderately elevated pulmonary artery systolic pressure.  3. Left atrial size was severely dilated.  4. Right atrial size was severely dilated.  5. The mitral valve is degenerative. Mild mitral valve regurgitation.  6. The tricuspid valve is myxomatous.  7. The aortic valve  is abnormal. Aortic valve regurgitation is mild. Mild to moderate aortic valve sclerosis/calcification is present, without any evidence of aortic stenosis. FINDINGS  Left Ventricle: Left ventricular ejection fraction, by estimation, is 40 to 45%. The left ventricle has mildly decreased function. The left ventricle demonstrates global hypokinesis. The left ventricular internal cavity size was severely dilated. There is mild concentric left ventricular hypertrophy. Left ventricular diastolic parameters are consistent with Grade II diastolic dysfunction (pseudonormalization). Right Ventricle: The right ventricular size is moderately enlarged. No increase in right ventricular wall thickness. Right ventricular systolic function is moderately reduced. There is moderately elevated pulmonary artery systolic pressure. The tricuspid  regurgitant velocity is 3.26 m/s, and with an assumed right atrial pressure of 10 mmHg, the estimated right ventricular systolic pressure is 97.4 mmHg. Left Atrium: Left atrial size was severely dilated. Right Atrium:  Right atrial size was severely dilated. Pericardium: Trivial pericardial effusion is present. Mitral Valve: The mitral valve is degenerative in appearance. Moderate to severe mitral annular calcification. Mild mitral valve regurgitation. Tricuspid Valve: The tricuspid valve is myxomatous. Tricuspid valve regurgitation is mild. Aortic Valve: The aortic valve is abnormal. Aortic valve regurgitation is mild. Mild to moderate aortic valve sclerosis/calcification is present, without any evidence of aortic stenosis. Aortic valve mean gradient measures 8.0 mmHg. Aortic valve peak gradient measures 15.2 mmHg. Aortic valve area, by VTI measures 1.35 cm. Pulmonic Valve: The pulmonic valve was normal in structure. Pulmonic valve regurgitation is mild. Aorta: The aortic root was not well visualized. IAS/Shunts: The interatrial septum was not well visualized.  LEFT VENTRICLE PLAX 2D LVIDd:         5.08 cm  Diastology LVIDs:         3.79 cm  LV e' lateral:   11.40 cm/s LV PW:         1.15 cm  LV E/e' lateral: 11.6 LV IVS:        1.18 cm  LV e' medial:    8.16 cm/s LVOT diam:     1.70 cm  LV E/e' medial:  16.2 LV SV:         58 LV SV Index:   26 LVOT Area:     2.27 cm  RIGHT VENTRICLE RV Mid diam:    4.35 cm RV S prime:     9.57 cm/s TAPSE (M-mode): 1.5 cm LEFT ATRIUM              Index       RIGHT ATRIUM           Index LA diam:        4.90 cm  2.21 cm/m  RA Area:     24.70 cm LA Vol (A2C):   113.0 ml 51.05 ml/m RA Volume:   78.60 ml  35.51 ml/m LA Vol (A4C):   141.0 ml 63.70 ml/m LA Biplane Vol: 135.0 ml 60.99 ml/m  AORTIC VALVE                    PULMONIC VALVE AV Area (Vmax):    1.23 cm     PV Vmax:        1.09 m/s AV Area (Vmean):   1.20 cm     PV Peak grad:   4.8 mmHg AV Area (VTI):     1.35 cm     RVOT Peak grad: 1 mmHg AV Vmax:           195.00 cm/s AV Vmean:  131.000 cm/s AV VTI:            0.427 m AV Peak Grad:      15.2 mmHg AV Mean Grad:      8.0 mmHg LVOT Vmax:         106.00 cm/s LVOT Vmean:         69.000 cm/s LVOT VTI:          0.254 m LVOT/AV VTI ratio: 0.59  AORTA Ao Root diam: 2.30 cm MITRAL VALVE                TRICUSPID VALVE MV Area (PHT): 4.96 cm     TR Peak grad:   42.5 mmHg MV Decel Time: 153 msec     TR Vmax:        326.00 cm/s MV E velocity: 132.00 cm/s MV A velocity: 45.10 cm/s   SHUNTS MV E/A ratio:  2.93         Systemic VTI:  0.25 m                             Systemic Diam: 1.70 cm Neoma Laming MD Electronically signed by Neoma Laming MD Signature Date/Time: 05/06/2020/4:40:30 PM    Final    US LIVER DOPPLER  Result Date: 05/06/2020 CLINICAL DATA:  74 year old male with elevated LFTs, possible cirrhosis EXAM: DUPLEX ULTRASOUND OF LIVER TECHNIQUE: Color and duplex Doppler ultrasound was performed to evaluate the hepatic in-flow and out-flow vessels. COMPARISON:  Right upper quadrant ultrasound performed concurrently FINDINGS: Liver: Please see separately dictated right upper quadrant ultrasound. Main Portal Vein size: 1.4 cm Portal Vein Velocities Main Prox:  65 cm/sec with normal hepatopetal directional flow. Main Mid: 57 cm/sec with normal hepatopetal directional flow. Main Dist:  47 cm/sec with normal hepatopetal directional flow. Right: 17 cm/sec with normal hepatopetal directional flow. Left: 28 cm/sec with normal hepatopetal directional flow. Hepatic Vein Velocities Right:  29 cm/sec Middle:  24 cm/sec Left:  22 cm/sec IVC: Present and patent with normal respiratory phasicity. Hepatic Artery Velocity:  28 cm/sec Splenic Vein Velocity:  65 cm/sec Spleen: 15.8 cm x 14.4 cm x 5.2 cm with a total volume of 617 cm^3 (411 cm^3 is upper limit normal) Portal Vein Occlusion/Thrombus: No Splenic Vein Occlusion/Thrombus: No Ascites: Perihepatic ascites is present. Varices: None IMPRESSION: 1. Hepatic cirrhosis with portal hypertension as evidenced by splenomegaly and mild ascites. 2. The portal and hepatic veins are patent with normal directional flow. Electronically Signed   By: Jacqulynn Cadet M.D.   On: 05/06/2020 14:30   US Abdomen Limited RUQ  Result Date: 05/06/2020 CLINICAL DATA:  Abnormal LFTs EXAM: ULTRASOUND ABDOMEN LIMITED RIGHT UPPER QUADRANT COMPARISON:  Concurrently obtained hepatic Doppler ultrasound. FINDINGS: Gallbladder: No gallstones or wall thickening visualized. No sonographic Murphy sign noted by sonographer. Common bile duct: Diameter: Normal at 3 mm Liver: Nodular hepatic contour. The hepatic parenchyma is diffusely heterogeneous and coarsened in appearance. No discrete nodule or mass is identified. Portal vein is patent on color Doppler imaging with normal direction of blood flow towards the liver. Other: Trace perihepatic ascites. Partial imaging of the right kidney demonstrates increased echogenicity of the renal parenchyma. IMPRESSION: 1. Hepatic cirrhosis with trace perihepatic ascites. 2. No evidence of cholelithiasis or biliary ductal dilatation. 3. Incidental note is made of increased echogenicity of the renal parenchyma. Does the patient have underlying medical renal disease? Electronically Signed   By: Dellis Filbert.D.  On: 05/06/2020 14:28        Scheduled Meds: . sodium chloride   Intravenous Once  . sodium chloride   Intravenous Once  . atenolol  25 mg Oral BID  . Chlorhexidine Gluconate Cloth  6 each Topical Daily  . docusate sodium  100 mg Oral BID  . furosemide  40 mg Intravenous Q12H  . insulin aspart  0-9 Units Subcutaneous TID WC  . melatonin  2.5 mg Oral QHS  . multivitamin with minerals  1 tablet Oral Daily  . simvastatin  40 mg Oral Daily  . sodium chloride flush  3 mL Intravenous Q12H   Continuous Infusions: . sodium chloride    . cefTRIAXone (ROCEPHIN)  IV 1 g (05/07/20 1116)  . octreotide  (SANDOSTATIN)    IV infusion 50 mcg/hr (05/07/20 1435)  . pantoprozole (PROTONIX) infusion 8 mg/hr (05/07/20 1435)  . pantoprazole (PROTONIX) IVPB       Time spent: 37 minutes with over 50% of the time coordinating the  patient's care    Harold Hedge, DO [facility] Pager [phone]  Call night coverage person covering after 7pm

## 2020-05-08 ENCOUNTER — Inpatient Hospital Stay: Payer: Medicare Other | Admitting: Registered Nurse

## 2020-05-08 ENCOUNTER — Encounter: Payer: Self-pay | Admitting: Family Medicine

## 2020-05-08 ENCOUNTER — Encounter
Admission: EM | Disposition: A | Discharge status: Home Health | Payer: Self-pay | Source: Home / Self Care | Attending: Internal Medicine

## 2020-05-08 HISTORY — PX: ESOPHAGOGASTRODUODENOSCOPY (EGD) WITH PROPOFOL: SHX5813

## 2020-05-08 LAB — COMPREHENSIVE METABOLIC PANEL
ALT: 10 U/L (ref 0–44)
AST: 19 U/L (ref 15–41)
Albumin: 2.8 g/dL — ABNORMAL LOW (ref 3.5–5.0)
Alkaline Phosphatase: 55 U/L (ref 38–126)
Anion gap: 6 (ref 5–15)
BUN: 35 mg/dL — ABNORMAL HIGH (ref 8–23)
CO2: 26 mmol/L (ref 22–32)
Calcium: 8 mg/dL — ABNORMAL LOW (ref 8.9–10.3)
Chloride: 107 mmol/L (ref 98–111)
Creatinine, Ser: 2.1 mg/dL — ABNORMAL HIGH (ref 0.61–1.24)
GFR calc Af Amer: 35 mL/min — ABNORMAL LOW (ref >60)
GFR calc non Af Amer: 30 mL/min — ABNORMAL LOW (ref >60)
Glucose, Bld: 109 mg/dL — ABNORMAL HIGH (ref 70–99)
Potassium: 4.2 mmol/L (ref 3.5–5.1)
Sodium: 139 mmol/L (ref 135–145)
Total Bilirubin: 1.5 mg/dL — ABNORMAL HIGH (ref 0.3–1.2)
Total Protein: 6.3 g/dL — ABNORMAL LOW (ref 6.5–8.1)

## 2020-05-08 LAB — GLUCOSE, CAPILLARY
Glucose-Capillary: 102 mg/dL — ABNORMAL HIGH (ref 70–99)
Glucose-Capillary: 102 mg/dL — ABNORMAL HIGH (ref 70–99)
Glucose-Capillary: 110 mg/dL — ABNORMAL HIGH (ref 70–99)
Glucose-Capillary: 201 mg/dL — ABNORMAL HIGH (ref 70–99)
Glucose-Capillary: 91 mg/dL (ref 70–99)

## 2020-05-08 LAB — CBC
HCT: 26.6 % — ABNORMAL LOW (ref 39.0–52.0)
Hemoglobin: 9 g/dL — ABNORMAL LOW (ref 13.0–17.0)
MCH: 28.1 pg (ref 26.0–34.0)
MCHC: 33.8 g/dL (ref 30.0–36.0)
MCV: 83.1 fL (ref 80.0–100.0)
Platelets: 85 10*3/uL — ABNORMAL LOW (ref 150–400)
RBC: 3.2 MIL/uL — ABNORMAL LOW (ref 4.22–5.81)
RDW: 16.2 % — ABNORMAL HIGH (ref 11.5–15.5)
WBC: 5 10*3/uL (ref 4.0–10.5)
nRBC: 0 % (ref 0.0–0.2)

## 2020-05-08 LAB — MAGNESIUM: Magnesium: 1.7 mg/dL (ref 1.7–2.4)

## 2020-05-08 SURGERY — ESOPHAGOGASTRODUODENOSCOPY (EGD) WITH PROPOFOL
Anesthesia: General

## 2020-05-08 MED ORDER — MAGNESIUM SULFATE 2 GM/50ML IV SOLN
2.0000 g | Freq: Once | INTRAVENOUS | Status: AC
  Administered 2020-05-08: 2 g via INTRAVENOUS
  Filled 2020-05-08: qty 50

## 2020-05-08 MED ORDER — PROPOFOL 10 MG/ML IV BOLUS
INTRAVENOUS | Status: DC | PRN
  Administered 2020-05-08: 100 mg via INTRAVENOUS

## 2020-05-08 MED ORDER — PANTOPRAZOLE SODIUM 40 MG PO TBEC
40.0000 mg | DELAYED_RELEASE_TABLET | Freq: Every day | ORAL | Status: DC
  Administered 2020-05-08 – 2020-05-11 (×4): 40 mg via ORAL
  Filled 2020-05-08 (×4): qty 1

## 2020-05-08 MED ORDER — PROPOFOL 500 MG/50ML IV EMUL
INTRAVENOUS | Status: DC | PRN
  Administered 2020-05-08: 150 ug/kg/min via INTRAVENOUS

## 2020-05-08 MED ORDER — SODIUM CHLORIDE 0.9 % IV SOLN: INTRAVENOUS | Status: DC

## 2020-05-08 MED ORDER — EPHEDRINE 5 MG/ML INJ
INTRAVENOUS | Status: AC
  Filled 2020-05-08: qty 10

## 2020-05-08 MED ORDER — EPHEDRINE SULFATE 50 MG/ML IJ SOLN
INTRAMUSCULAR | Status: DC | PRN
  Administered 2020-05-08: 10 mg via INTRAVENOUS

## 2020-05-08 MED ORDER — LIDOCAINE HCL (CARDIAC) PF 100 MG/5ML IV SOSY
PREFILLED_SYRINGE | INTRAVENOUS | Status: DC | PRN
  Administered 2020-05-08: 100 mg via INTRAVENOUS

## 2020-05-08 NOTE — Progress Notes (Signed)
   05/08/20 1300  Clinical Encounter Type  Visited With Patient  Visit Type Follow-up  Referral From Chaplain  Consult/Referral To Chaplain  As chaplain walked by, she went in and spoke to patient. Patient told chaplain that the results from his procedure were good. Chaplain told him she was glad to hear that. Chaplain will follow up with patient tomorrow.

## 2020-05-08 NOTE — Op Note (Signed)
[facility] Gastroenterology Patient Name: Patrick [name] Procedure Date: 05/08/2020 11:21 AM MRN: [id number] Account #: [id number] Date of Birth: 1946/08/14 Admit Type: Inpatient Age: 74 Room: [facility] ENDO ROOM 4 Gender: Male Note Status: Finalized Procedure:             Upper GI endoscopy Indications:           Iron deficiency anemia Providers:             [name] MD, MD Referring MD:          Leonie Douglas. Doy Hutching, MD (Referring MD) Medicines:             Monitored Anesthesia Care Complications:         No immediate complications. Procedure:             Pre-Anesthesia Assessment:                        - Prior to the procedure, a History and Physical was                         performed, and patient medications, allergies and                         sensitivities were reviewed. The patient's tolerance                         of previous anesthesia was reviewed.                        - The risks and benefits of the procedure and the                         sedation options and risks were discussed with the                         patient. All questions were answered and informed                         consent was obtained.                        - ASA Grade Assessment: III - A patient with severe                         systemic disease.                        After obtaining informed consent, the endoscope was                         passed under direct vision. Throughout the procedure,                         the patient's blood pressure, pulse, and oxygen                         saturations were monitored continuously. The Endoscope                         was introduced through the mouth,  and advanced to the                         third part of duodenum. The upper GI endoscopy was                         accomplished with ease. The patient tolerated the                         procedure well. Findings:      The esophagus was normal.      A large amount  of food (residue) was found in the entire examined       stomach.      The examined duodenum was normal.      The cardia and gastric fundus were normal on retroflexion. Impression:            - Normal esophagus.                        - A large amount of food (residue) in the stomach.                        - Normal examined duodenum.                        - No specimens collected. Recommendation:        - Return patient to hospital ward for ongoing care.                        - Advance diet as tolerated today.                        - Suggest IV iron                        He has not had any overt rectaql bleeding or melena in                         the hospital , suspect chronic blood loss not acute .                        Limited visualization of the stomach due to food - no                         blkood seen in the stomach or small bowel suggesting                         no active bleeding                        Suggest D/c home after IV iron and follow up with Dr                         Alice Reichert for repeat EGD+/- colonoscopy+/- capsule study                         of the small bowel as an outpatient Procedure Code(s):     --- Professional ---  94076, Esophagogastroduodenoscopy, flexible,                         transoral; diagnostic, including collection of                         specimen(s) by brushing or washing, when performed                         (separate procedure) Diagnosis Code(s):     --- Professional ---                        D50.9, Iron deficiency anemia, unspecified CPT copyright 2019 American Medical Association. All rights reserved. The codes documented in this report are preliminary and upon coder review may  be revised to meet current compliance requirements. [name], MD [name] MD, MD 05/08/2020 11:44:20 AM This report has been signed electronically. Number of Addenda: 0 Note Initiated On: 05/08/2020 11:21 AM Estimated Blood  Loss:  Estimated blood loss: none.      [facility]

## 2020-05-08 NOTE — H&P (Signed)
Patrick Bellows, MD [address], [place], [place], [place], [address] [id number] [place], [place], [place], [address] Phone: [phone]  Fax: [phone]  Primary Care Physician:  Idelle Crouch, MD   Pre-Procedure History & Physical: HPI:  Patrick [name] is a 74 y.o. male is here for an endoscopy    Past Medical History:  Diagnosis Date  . ASCVD (arteriosclerotic cardiovascular disease)   . Chronic pulmonary hypertension ([place])   . Diabetes mellitus without complication ([place])   . GERD (gastroesophageal reflux disease)   . Heart attack ([place]) 1989  . Heart disease   . Hyperlipidemia   . Hypertension   . IDA (iron deficiency anemia)   . Moderate mitral insufficiency   . Moderate tricuspid insufficiency   . OA (osteoarthritis)   . Paroxysmal A-fib ([place])   . Thrombocytosis ([place])   . Tremor     Past Surgical History:  Procedure Laterality Date  . [place]   with stainless steel stent  . CATARACT EXTRACTION    . Closed Reduction Vertebral Process Fracture  1966  . COLONOSCOPY  10/13/2014  . HEMORRHOIDECTOMY WITH HEMORRHOID BANDING    . HERNIA REPAIR     x 2  . KNEE SURGERY     bilateral knee surgery    Prior to Admission medications   Medication Sig Start Date End Date Taking? Authorizing Provider  cyanocobalamin 1000 MCG tablet Take 1,000 mcg by mouth daily at 6 (six) AM.   Yes [provider]  folic acid (FOLVITE) 1 MG tablet TAKE 1 TABLET BY MOUTH ONCE DAILY Patient taking differently: Take 1 mg by mouth daily.  11/09/19  Yes Cammie Sickle, MD  JANUMET 50-1000 MG tablet Take 1 tablet by mouth at bedtime. 04/14/20  Yes [provider]  Multiple Vitamin (MULTIVITAMIN WITH MINERALS) TABS tablet Take 1 tablet by mouth daily. 06/12/18  Yes Gouru, Illene Silver, MD  potassium chloride SA (K-DUR,KLOR-CON) 20 MEQ tablet Take 1 tablet (20 mEq total) by mouth daily. 06/11/18  Yes Gouru, Illene Silver, MD  simvastatin (ZOCOR) 40 MG  tablet Take 40 mg by mouth daily.   Yes [provider]  spironolactone (ALDACTONE) 100 MG tablet Take 50 mg by mouth daily at 6 (six) AM. 03/16/20  Yes [provider]  SYMBICORT 160-4.5 MCG/ACT inhaler Inhale 2 puffs into the lungs 2 (two) times daily. 04/05/20  Yes [provider]  tamsulosin (FLOMAX) 0.4 MG CAPS capsule Take 0.4 mg by mouth daily at 6 (six) AM. 05/18/19 05/17/20 Yes [provider]  thiamine 100 MG tablet Take 1 tablet (100 mg total) by mouth daily. 06/12/18  Yes Gouru, Illene Silver, MD  torsemide (DEMADEX) 20 MG tablet Take 20 mg by mouth 2 (two) times daily. 02/15/20  Yes [provider]  acetaminophen (TYLENOL) 500 MG tablet Take 500 mg by mouth every 8 (eight) hours as needed.    [provider]  furosemide (LASIX) 40 MG tablet Take 1 tablet by mouth daily. Patient not taking: Reported on 05/05/2020 03/26/18   [provider]  nabumetone (RELAFEN) 500 MG tablet Take 500 mg by mouth at bedtime. Patient not taking: Reported on 05/05/2020 04/05/20   [provider]  protein supplement shake (PREMIER PROTEIN) LIQD Take 325 mLs (11 oz total) by mouth 2 (two) times daily between meals. Patient not taking: Reported on 04/28/2019 06/12/18   Nicholes Mango, MD  quiNINE (QUALAQUIN) 324 MG capsule Take 324 mg by mouth daily as  needed.    [provider]    Allergies as of 05/05/2020 - Review Complete 05/05/2020  Allergen Reaction Noted  . Aspirin Other (See Comments) 03/26/2018  . Dextrose Other (See Comments) 03/26/2018    Family History  Problem Relation Age of Onset  . Heart attack Father     Social History   Socioeconomic History  . Marital status: Married    Spouse name: Not on file  . Number of children: Not on file  . Years of education: Not on file  . Highest education level: Not on file  Occupational History  . Not on file  Tobacco Use  . Smoking status: Former Smoker    Packs/day: 0.50    Years:  40.00    Pack years: 20.00    Types: Cigarettes    Quit date: 04/18/1997    Years since quitting: 23.0  . Smokeless tobacco: Never Used  Vaping Use  . Vaping Use: Never used  Substance and Sexual Activity  . Alcohol use: Not Currently    Alcohol/week: 50.0 standard drinks    Types: 50 Shots of liquor per week    Comment: "I mix a couple cups whisky/day"  . Drug use: Not Currently  . Sexual activity: Yes  Other Topics Concern  . Not on file  Social History Narrative  . Not on file   Social Determinants of Health   Financial Resource Strain:   . Difficulty of Paying Living Expenses:   Food Insecurity:   . Worried About Charity fundraiser in the Last Year:   . Arboriculturist in the Last Year:   Transportation Needs:   . Film/video editor (Medical):   Marland Kitchen Lack of Transportation (Non-Medical):   Physical Activity:   . Days of Exercise per Week:   . Minutes of Exercise per Session:   Stress:   . Feeling of Stress :   Social Connections:   . Frequency of Communication with Friends and Family:   . Frequency of Social Gatherings with Friends and Family:   . Attends Religious Services:   . Active Member of Clubs or Organizations:   . Attends Archivist Meetings:   Marland Kitchen Marital Status:   Intimate Partner Violence:   . Fear of Current or Ex-Partner:   . Emotionally Abused:   Marland Kitchen Physically Abused:   . Sexually Abused:     Review of Systems: See HPI, otherwise negative ROS  Physical Exam: BP (!) 123/54   Pulse 63   Temp 98 F (36.7 C) (Tympanic)   Resp 20   Ht 5\' 9"  (1.753 m)   Wt 106.6 kg   SpO2 100%   BMI 34.70 kg/m  General:   Alert,  pleasant and cooperative in NAD Head:  Normocephalic and atraumatic. Neck:  Supple; no masses or thyromegaly. Lungs:  Clear throughout to auscultation, normal respiratory effort.    Heart:  +S1, +S2, Regular rate and rhythm, No edema. Abdomen:  Soft, nontender and nondistended. Normal bowel sounds, without guarding, and  without rebound.   Neurologic:  Alert and  oriented x4;  grossly normal neurologically.  Impression/Plan: Patrick [name] is here for an endoscopy  to be performed for  evaluation of anemia    Risks, benefits, limitations, and alternatives regarding endoscopy have been reviewed with the patient.  Questions have been answered.  All parties agreeable.   Patrick Bellows, MD  05/08/2020, 11:18 AM

## 2020-05-08 NOTE — Progress Notes (Signed)
[facility] Encounter Note  Patient: Patrick [name] / Admit Date: 05/05/2020 / Date of Encounter: 05/08/2020, 7:29 AM   Subjective: Patient feels much better today.  No evidence of GI bleeding shortness of breath chest pain or other significant cardiac symptoms.  Patient's heart rate is controlled at 56 bpm but still atrial fibrillation.  Patient has had echocardiogram recently showing ejection fraction of 45% most consistent with atrial fibrillation with some valvular heart disease including moderate aortic valve stenosis unchanged from before.  Stress test last year showed normal myocardial perfusion without evidence of myocardial ischemia.  The patient has not had any myocardial infarction or other significant symptoms at this time and is at lowest risk possible for endoscopy colonoscopy if necessary  Review of Systems: Positive for: None Negative for: Vision change, hearing change, syncope, dizziness, nausea, vomiting,diarrhea, bloody stool, stomach pain, cough, congestion, diaphoresis, urinary frequency, urinary pain,skin lesions, skin rashes Others previously listed  Objective: Telemetry: Atrial fibrillation with controlled ventricular rate Physical Exam: Blood pressure 124/61, pulse 97, temperature 97.9 F (36.6 C), temperature source Oral, resp. rate (!) 22, height 5\' 9"  (1.753 m), weight 106.4 kg, SpO2 100 %. Body mass index is 34.64 kg/m. General: Well developed, well nourished, in no acute distress. Head: Normocephalic, atraumatic, sclera non-icteric, no xanthomas, nares are without discharge. Neck: No apparent masses Lungs: Normal respirations with few wheezes, no rhonchi, no rales , no crackles   Heart: Irregular rate and rhythm, normal S1 S2, no murmur, no rub, no gallop, PMI is normal size and placement, carotid upstroke normal without bruit, jugular venous pressure normal Abdomen: Soft, non-tender, non-distended with normoactive bowel sounds. No  hepatosplenomegaly. Abdominal aorta is normal size without bruit Extremities: Trace edema, no clubbing, no cyanosis, no ulcers,  Peripheral: 2+ radial, 2+ femoral, 2+ dorsal pedal pulses Neuro: Alert and oriented. Moves all extremities spontaneously. Psych:  Responds to questions appropriately with a normal affect.   Intake/Output Summary (Last 24 hours) at 05/08/2020 0729 Last data filed at 05/08/2020 0600 Gross per 24 hour  Intake 943.02 ml  Output 3450 ml  Net -2506.98 ml    Inpatient Medications:  . sodium chloride   Intravenous Once  . sodium chloride   Intravenous Once  . atenolol  25 mg Oral BID  . Chlorhexidine Gluconate Cloth  6 each Topical Daily  . docusate sodium  100 mg Oral BID  . furosemide  40 mg Intravenous Q12H  . insulin aspart  0-9 Units Subcutaneous TID WC  . melatonin  2.5 mg Oral QHS  . multivitamin with minerals  1 tablet Oral Daily  . simvastatin  40 mg Oral Daily  . sodium chloride flush  3 mL Intravenous Q12H   Infusions:  . sodium chloride    . cefTRIAXone (ROCEPHIN)  IV 1 g (05/07/20 1116)  . octreotide  (SANDOSTATIN)    IV infusion 50 mcg/hr (05/08/20 0600)  . pantoprozole (PROTONIX) infusion 8 mg/hr (05/08/20 0600)  . pantoprazole (PROTONIX) IVPB      Labs: Recent Labs    05/05/20 1849 05/06/20 0525 05/07/20 0717 05/08/20 0500  NA  --    < > 137 139  K  --    < > 4.4 4.2  CL  --    < > 106 107  CO2  --    < > 23 26  GLUCOSE  --    < > 112* 109*  BUN  --    < > 33* 35*  CREATININE  --    < >  2.18* 2.10*  CALCIUM 8.7*   < > 8.2* 8.0*  MG 1.9  --  1.7 1.7  PHOS 4.1  --   --   --    < > = values in this interval not displayed.   Recent Labs    05/05/20 1405 05/08/20 0500  AST 22 19  ALT 11 10  ALKPHOS 63 55  BILITOT 1.4* 1.5*  PROT 7.4 6.3*  ALBUMIN 3.1* 2.8*   Recent Labs    05/05/20 1405 05/06/20 0525 05/07/20 0717 05/08/20 0500  WBC 4.7   < > 5.0 5.0  NEUTROABS 3.3  --   --   --   HGB 6.3*   < > 8.6* 9.0*  HCT  20.2*   < > 26.9* 26.6*  MCV 83.5   < > 85.1 83.1  PLT 101*   < > 88* 85*   < > = values in this interval not displayed.   No results for input(s): CKTOTAL, CKMB, TROPONINI in the last 72 hours. Invalid input(s): POCBNP Recent Labs    05/05/20 1849  HGBA1C 4.7*     Weights: Filed Weights   05/05/20 1832 05/07/20 0500 05/08/20 0259  Weight: 106.7 kg 106.3 kg 106.4 kg     Radiology/Studies:  DG Chest Portable 1 View  Result Date: 05/05/2020 CLINICAL DATA:  Shortness of breath EXAM: PORTABLE CHEST 1 VIEW COMPARISON:  10/29/2018, 06/09/2018 CT 03/10/2019 FINDINGS: Cardiomegaly with vascular congestion and diffuse interstitial opacities suspicious for pulmonary edema. Small bilateral pleural effusions with possible loculation on the left. Volume loss left thorax. Hazy opacity at the left lung base. Aortic atherosclerosis. Chronic left-sided rib fractures. IMPRESSION: 1. Cardiomegaly with vascular congestion and diffuse interstitial opacities suspicious for pulmonary edema. Small bilateral pleural effusions with possible loculation on the left. 2. Hazy opacity at the left base, may be due to combination of pleural effusion and consolidation/possible round atelectasis noted on prior CT. Chest CT follow-up previously recommended. Electronically Signed   By: Donavan Foil M.D.   On: 05/05/2020 16:44   ECHOCARDIOGRAM COMPLETE  Result Date: 05/06/2020    ECHOCARDIOGRAM REPORT   Patient Name:   Patrick [name] Date of Exam: 05/06/2020 Medical Rec #:  [id number]           Height:       69.0 in Accession #:    [id number]          Weight:       235.2 lb Date of Birth:  04-27-1946            BSA:          2.213 m Patient Age:    74 years            BP:           119/44 mmHg Patient Gender: M                   HR:           50 bpm. Exam Location:  [facility] Procedure: 2D Echo, Cardiac Doppler and Color Doppler Indications:     CHF-Acute Diastolic 606.30 / Z60.10  History:         Patient has no prior history of  Echocardiogram examinations.                  Arrythmias:Atrial Fibrillation; Risk Factors:Hypertension.  Sonographer:     Alyse Low Roar Referring Phys:  [place] Diagnosing Phys: Neoma Laming MD [place]  1. Left ventricular ejection fraction, by estimation, is 40 to 45%. The left ventricle has mildly decreased function. The left ventricle demonstrates global hypokinesis. The left ventricular internal cavity size was severely dilated. There is mild concentric left ventricular hypertrophy. Left ventricular diastolic parameters are consistent with Grade II diastolic dysfunction (pseudonormalization).  2. Right ventricular systolic function is moderately reduced. The right ventricular size is moderately enlarged. There is moderately elevated pulmonary artery systolic pressure.  3. Left atrial size was severely dilated.  4. Right atrial size was severely dilated.  5. The mitral valve is degenerative. Mild mitral valve regurgitation.  6. The tricuspid valve is myxomatous.  7. The aortic valve is abnormal. Aortic valve regurgitation is mild. Mild to moderate aortic valve sclerosis/calcification is present, without any evidence of aortic stenosis. FINDINGS  Left Ventricle: Left ventricular ejection fraction, by estimation, is 40 to 45%. The left ventricle has mildly decreased function. The left ventricle demonstrates global hypokinesis. The left ventricular internal cavity size was severely dilated. There is mild concentric left ventricular hypertrophy. Left ventricular diastolic parameters are consistent with Grade II diastolic dysfunction (pseudonormalization). Right Ventricle: The right ventricular size is moderately enlarged. No increase in right ventricular wall thickness. Right ventricular systolic function is moderately reduced. There is moderately elevated pulmonary artery systolic pressure. The tricuspid  regurgitant velocity is 3.26 m/s, and with an assumed right atrial pressure of 10 mmHg, the  estimated right ventricular systolic pressure is 11.9 mmHg. Left Atrium: Left atrial size was severely dilated. Right Atrium: Right atrial size was severely dilated. Pericardium: Trivial pericardial effusion is present. Mitral Valve: The mitral valve is degenerative in appearance. Moderate to severe mitral annular calcification. Mild mitral valve regurgitation. Tricuspid Valve: The tricuspid valve is myxomatous. Tricuspid valve regurgitation is mild. Aortic Valve: The aortic valve is abnormal. Aortic valve regurgitation is mild. Mild to moderate aortic valve sclerosis/calcification is present, without any evidence of aortic stenosis. Aortic valve mean gradient measures 8.0 mmHg. Aortic valve peak gradient measures 15.2 mmHg. Aortic valve area, by VTI measures 1.35 cm. Pulmonic Valve: The pulmonic valve was normal in structure. Pulmonic valve regurgitation is mild. Aorta: The aortic root was not well visualized. IAS/Shunts: The interatrial septum was not well visualized.  LEFT VENTRICLE PLAX 2D LVIDd:         5.08 cm  Diastology LVIDs:         3.79 cm  LV e' lateral:   11.40 cm/s LV PW:         1.15 cm  LV E/e' lateral: 11.6 LV IVS:        1.18 cm  LV e' medial:    8.16 cm/s LVOT diam:     1.70 cm  LV E/e' medial:  16.2 LV SV:         58 LV SV Index:   26 LVOT Area:     2.27 cm  RIGHT VENTRICLE RV Mid diam:    4.35 cm RV S prime:     9.57 cm/s TAPSE (M-mode): 1.5 cm LEFT ATRIUM              Index       RIGHT ATRIUM           Index LA diam:        4.90 cm  2.21 cm/m  RA Area:     24.70 cm LA Vol (A2C):   113.0 ml 51.05 ml/m RA Volume:   78.60 ml  35.51 ml/m LA Vol (A4C):   141.0  ml 63.70 ml/m LA Biplane Vol: 135.0 ml 60.99 ml/m  AORTIC VALVE                    PULMONIC VALVE AV Area (Vmax):    1.23 cm     PV Vmax:        1.09 m/s AV Area (Vmean):   1.20 cm     PV Peak grad:   4.8 mmHg AV Area (VTI):     1.35 cm     RVOT Peak grad: 1 mmHg AV Vmax:           195.00 cm/s AV Vmean:          131.000 cm/s AV VTI:             0.427 m AV Peak Grad:      15.2 mmHg AV Mean Grad:      8.0 mmHg LVOT Vmax:         106.00 cm/s LVOT Vmean:        69.000 cm/s LVOT VTI:          0.254 m LVOT/AV VTI ratio: 0.59  AORTA Ao Root diam: 2.30 cm MITRAL VALVE                TRICUSPID VALVE MV Area (PHT): 4.96 cm     TR Peak grad:   42.5 mmHg MV Decel Time: 153 msec     TR Vmax:        326.00 cm/s MV E velocity: 132.00 cm/s MV A velocity: 45.10 cm/s   SHUNTS MV E/A ratio:  2.93         Systemic VTI:  0.25 m                             Systemic Diam: 1.70 cm Neoma Laming MD Electronically signed by Neoma Laming MD Signature Date/Time: 05/06/2020/4:40:30 PM    Final    US LIVER DOPPLER  Result Date: 05/06/2020 CLINICAL DATA:  74 year old male with elevated LFTs, possible cirrhosis EXAM: DUPLEX ULTRASOUND OF LIVER TECHNIQUE: Color and duplex Doppler ultrasound was performed to evaluate the hepatic in-flow and out-flow vessels. COMPARISON:  Right upper quadrant ultrasound performed concurrently FINDINGS: Liver: Please see separately dictated right upper quadrant ultrasound. Main Portal Vein size: 1.4 cm Portal Vein Velocities Main Prox:  65 cm/sec with normal hepatopetal directional flow. Main Mid: 57 cm/sec with normal hepatopetal directional flow. Main Dist:  47 cm/sec with normal hepatopetal directional flow. Right: 17 cm/sec with normal hepatopetal directional flow. Left: 28 cm/sec with normal hepatopetal directional flow. Hepatic Vein Velocities Right:  29 cm/sec Middle:  24 cm/sec Left:  22 cm/sec IVC: Present and patent with normal respiratory phasicity. Hepatic Artery Velocity:  28 cm/sec Splenic Vein Velocity:  65 cm/sec Spleen: 15.8 cm x 14.4 cm x 5.2 cm with a total volume of 617 cm^3 (411 cm^3 is upper limit normal) Portal Vein Occlusion/Thrombus: No Splenic Vein Occlusion/Thrombus: No Ascites: Perihepatic ascites is present. Varices: None IMPRESSION: 1. Hepatic cirrhosis with portal hypertension as evidenced by splenomegaly and mild  ascites. 2. The portal and hepatic veins are patent with normal directional flow. Electronically Signed   By: Jacqulynn Cadet M.D.   On: 05/06/2020 14:30   US Abdomen Limited RUQ  Result Date: 05/06/2020 CLINICAL DATA:  Abnormal LFTs EXAM: ULTRASOUND ABDOMEN LIMITED RIGHT UPPER QUADRANT COMPARISON:  Concurrently obtained hepatic Doppler ultrasound. FINDINGS: Gallbladder: No gallstones or wall thickening  visualized. No sonographic Murphy sign noted by sonographer. Common bile duct: Diameter: Normal at 3 mm Liver: Nodular hepatic contour. The hepatic parenchyma is diffusely heterogeneous and coarsened in appearance. No discrete nodule or mass is identified. Portal vein is patent on color Doppler imaging with normal direction of blood flow towards the liver. Other: Trace perihepatic ascites. Partial imaging of the right kidney demonstrates increased echogenicity of the renal parenchyma. IMPRESSION: 1. Hepatic cirrhosis with trace perihepatic ascites. 2. No evidence of cholelithiasis or biliary ductal dilatation. 3. Incidental note is made of increased echogenicity of the renal parenchyma. Does the patient have underlying medical renal disease? Electronically Signed   By: Jacqulynn Cadet M.D.   On: 05/06/2020 14:28     Assessment and Recommendation  74 y.o. male with known chronic nonvalvular atrial fibrillation mild global LV systolic dysfunction with moderate aortic valve stenosis with acute on chronic congestive heart failure secondary to acute hematemesis anemia chronic kidney disease improved after intravenous Lasix with controlled heart rate and no current evidence of myocardial infarction at lowest risk possible for cardiovascular complication with endoscopy colonoscopy 1.  Proceed to endoscopy colonoscopy for primary source of bleeding which may be esophageal varices 2.  Continue supportive care of anemia and chronic kidney disease 3.  No further cardiac intervention and/or diagnostics necessary  at this time due to no evidence of myocardial infarction and/or worsening LV dysfunction by echocardiogram and recent stress test showing normal myocardial perfusion 4.  Continue atenolol for heart rate control of chronic nonvalvular atrial fibrillation without change 5.  No anticoagulation due to GI bleed and other concerns 6.  Ambulation and rehabilitation without restriction  Signed, Serafina Royals M.D. FACC

## 2020-05-08 NOTE — Anesthesia Preprocedure Evaluation (Signed)
Anesthesia Evaluation  Patient identified by MRN, date of birth, ID band Patient awake    Reviewed: Allergy & Precautions, H&P , NPO status , Patient's Chart, lab work & pertinent test results, reviewed documented beta blocker date and time   History of Anesthesia Complications Negative for: history of anesthetic complications  Airway Mallampati: II  TM Distance: >3 FB Neck ROM: full    Dental  (+) Edentulous Upper, Edentulous Lower, Dental Advidsory Given   Pulmonary shortness of breath and with exertion, neg COPD, neg recent URI, former smoker,    Pulmonary exam normal breath sounds clear to auscultation       Cardiovascular Exercise Tolerance: Good hypertension, (-) angina+ CAD, + Past MI and + Cardiac Stents  (-) CABG + dysrhythmias Atrial Fibrillation + Valvular Problems/Murmurs AS  Rhythm:irregular Rate:Bradycardia     Neuro/Psych negative neurological ROS  negative psych ROS   GI/Hepatic GERD  ,(+) Cirrhosis       ,   Endo/Other  diabetes  Renal/GU CRFRenal disease  negative genitourinary   Musculoskeletal   Abdominal   Peds  Hematology  (+) Blood dyscrasia, anemia ,   Anesthesia Other Findings Past Medical History: No date: ASCVD (arteriosclerotic cardiovascular disease) No date: Chronic pulmonary hypertension ([facility]) No date: Diabetes mellitus without complication ([facility]) No date: GERD (gastroesophageal reflux disease) 1989: Heart attack ([place]) No date: Heart disease No date: Hyperlipidemia No date: Hypertension No date: IDA (iron deficiency anemia) No date: Moderate mitral insufficiency No date: Moderate tricuspid insufficiency No date: OA (osteoarthritis) No date: Paroxysmal A-fib ([facility]) No date: Thrombocytosis ([facility]) No date: Tremor   Reproductive/Obstetrics negative OB ROS                             Anesthesia Physical Anesthesia Plan  ASA: III  Anesthesia Plan:  General   Post-op Pain Management:    Induction: Intravenous  PONV Risk Score and Plan: 2 and Propofol infusion and TIVA  Airway Management Planned: Natural Airway and Nasal Cannula  Additional Equipment:   Intra-op Plan:   Post-operative Plan:   Informed Consent: I have reviewed the patients History and Physical, chart, labs and discussed the procedure including the risks, benefits and alternatives for the proposed anesthesia with the patient or authorized representative who has indicated his/her understanding and acceptance.     Dental Advisory Given  Plan Discussed with: Anesthesiologist, CRNA and Surgeon  Anesthesia Plan Comments:         Anesthesia Quick Evaluation

## 2020-05-08 NOTE — Progress Notes (Addendum)
PROGRESS NOTE    Patrick [name]    Code Status: Full Code  OYD:[id number] DOB: 14-Sep-1946 DOA: 05/05/2020 LOS: 3 days  PCP: Idelle Crouch, MD CC:  Chief Complaint  Patient presents with  . Anemia       Hospital Summary   He is a pleasant 74 year old male with past medical history of iron deficiency anemia, thrombocytopenia, liver cirrhosis, splenomegaly, alcohol abuse, previous GI bleed, distant tobacco abuse, hypertension, hyperlipidemia, type 2 diabetes, GERD, CAD who presented with generalized weakness, shortness of breath x2 weeks and noted to be severely anemic with Hb 6.0 at his oncology follow-up visit and sent to the ED.  Reportedly had progressive weakness with malaise, nausea vomiting diarrhea and melena.  He was admitted to the stepdown unit with concern for GI bleed and GI was consulted.  Started on octreotide and PPI drip. Also noted to be in A. fib with SVR.  Echo was obtained which showed EF 40 to 45% hypokinesis, severe biatrial dilation, and grade 2 diastolic dysfunction currently reduced RV systolic function started on treatment for heart failure exacerbation.  Cardiology was consulted for cardiac clearance prior to EGD who cleared patient for procedure.  7/12: EGD unremarkable.  A & P   Principal Problem:   GIB (gastrointestinal bleeding) Active Problems:   Absolute anemia   Arterial vascular disease   Diabetes (HCC)   Acid reflux   Fecal occult blood test positive   HLD (hyperlipidemia)   Arthritis, degenerative   1. Acute blood loss anemia secondary to acute GI bleed, suspected AVMs, stable a. Hb 6.3 on presentation, S/p 2 unit PRBCs, now 9.9 b. Cardiology consulted for cardiac clearance, cleared for GI procedures c. Stop octreotide and PPI drip, start Prilosec 40 mg p.o. d. ceftriaxone day 2 for SBP prophylaxis in cirrhotic e. EGD today overall unremarkable but food in stomach.  Outpatient follow-up for repeat EGD +/-colonoscopy +/-capsule  study  2. Acute hypoxic respiratory failure secondary to diastolic heart failure exacerbation a. Echo 7/11: EF 40 to 45% with mildly decreased LV systolic function, LV global hypokinesis with grade 2 diastolic dysfunction and reduced RV systolic function as well as severely dilated right and left atria and myxomatous TV, mild AR b. Continue Lasix IV twice daily for today c. Low-sodium diet d. I/O and daily weights  3. Atrial fibrillation with SVR a. Not on anticoagulation due to current and history of GI bleed b. Continue atenolol at low-dose for HR control per cardiology  4. Iron deficiency anemia a. Gets IV iron outpatient b. Ferritin 22 c. Given IV iron on admission d. GI studies as above  5. History of cirrhosis a. Meld score 21: Estimated 19.6% 13-month mortality b. Lasix as above c. GI on board  6. Supratherapeutic INR a. likely from cirrhosis  7. AKI, Multifactorial GI bleed, heart failure, improved a. Follow-up   DVT prophylaxis: SCDs Start: 05/05/20 1635 Place TED hose Start: 05/05/20 1635   Family Communication: no family at bedside  Disposition Plan: Transfer to [facility] telemetry Status is: Inpatient  Remains inpatient appropriate because:Inpatient level of care appropriate due to severity of illness   Dispo: The patient is from: Home              Anticipated d/c is to: Home              Anticipated d/c date is: 1 day              Patient currently is not medically stable  to d/c.          Pressure injury documentation    None  Consultants  GI Cardio  Procedures  EGD 7/12  Antibiotics   Anti-infectives (From admission, onward)   Start     Dose/Rate Route Frequency Ordered Stop   05/07/20 1000  cefTRIAXone (ROCEPHIN) 1 g in sodium chloride 0.9 % 100 mL IVPB     Discontinue     1 g 200 mL/hr over 30 Minutes Intravenous Every 24 hours 05/07/20 0954     05/05/20 1630  cefTRIAXone (ROCEPHIN) 1 g in sodium chloride 0.9 % 100 mL IVPB        1  g 200 mL/hr over 30 Minutes Intravenous  Once 05/05/20 1619 05/05/20 1740        Subjective   Patient seen and examined at bedside no acute distress and resting comfortably.  No events overnight.   Denies any chest pain, shortness of breath, fever, nausea, vomiting, urinary complaints.  Admits to having bowel movement.  Otherwise ROS negative   Objective   Vitals:   05/08/20 0900 05/08/20 1000 05/08/20 1038 05/08/20 1150  BP: (!) 115/54 (!) 123/54 (!) 123/54 (!) 110/57  Pulse: 80 (!) 34 63   Resp: (!) 24 17 20    Temp:   98 F (36.7 C) (!) 96.8 F (36 C)  TempSrc:   Tympanic Temporal  SpO2: 99% 100% 100%   Weight:   106.6 kg   Height:   5\' 9"  (1.753 m)     Intake/Output Summary (Last 24 hours) at 05/08/2020 1457 Last data filed at 05/08/2020 1143 Gross per 24 hour  Intake 894.59 ml  Output 3450 ml  Net -2555.41 ml   Filed Weights   05/07/20 0500 05/08/20 0259 05/08/20 1038  Weight: 106.3 kg 106.4 kg 106.6 kg    Examination:  Physical Exam Vitals and nursing note reviewed.  Constitutional:      Appearance: Normal appearance.  HENT:     Head: Normocephalic and atraumatic.  Eyes:     Conjunctiva/sclera: Conjunctivae normal.  Cardiovascular:     Rate and Rhythm: Bradycardia present. Rhythm irregular.     Heart sounds: Murmur heard.   Pulmonary:     Effort: Pulmonary effort is normal.     Breath sounds: Normal breath sounds.  Abdominal:     General: Abdomen is flat.     Palpations: Abdomen is soft.  Musculoskeletal:     Right lower leg: 2+ Edema present.     Left lower leg: 2+ Edema present.  Skin:    Coloration: Skin is not jaundiced or pale.  Neurological:     Mental Status: He is alert. Mental status is at baseline.     Data Reviewed: I have personally reviewed following labs and imaging studies  CBC: Recent Labs  Lab 05/05/20 1405 05/05/20 1405 05/06/20 0525 05/06/20 0900 05/06/20 1743 05/07/20 0717 05/08/20 0500  WBC 4.7  --  4.4  --    --  5.0 5.0  NEUTROABS 3.3  --   --   --   --   --   --   HGB 6.3*   < > 7.1* 7.4* 8.3* 8.6* 9.0*  HCT 20.2*   < > 22.9* 23.7* 26.3* 26.9* 26.6*  MCV 83.5  --  84.5  --   --  85.1 83.1  PLT 101*  --  90*  --   --  88* 85*   < > = values in this interval not displayed.  Basic Metabolic Panel: Recent Labs  Lab 05/05/20 1405 05/05/20 1849 05/06/20 0525 05/07/20 0717 05/08/20 0500  NA 138  --  138 137 139  K 4.5  --  5.0 4.4 4.2  CL 109  --  108 106 107  CO2 20*  --  22 23 26   GLUCOSE 109*  --  113* 112* 109*  BUN 33*  --  33* 33* 35*  CREATININE 1.80*  --  1.81* 2.18* 2.10*  CALCIUM 8.5* 8.7* 8.4* 8.2* 8.0*  MG  --  1.9  --  1.7 1.7  PHOS  --  4.1  --   --   --    GFR: Estimated Creatinine Clearance: 37.1 mL/min (A) (by C-G formula based on SCr of 2.1 mg/dL (H)). Liver Function Tests: Recent Labs  Lab 05/05/20 1405 05/08/20 0500  AST 22 19  ALT 11 10  ALKPHOS 63 55  BILITOT 1.4* 1.5*  PROT 7.4 6.3*  ALBUMIN 3.1* 2.8*   No results for input(s): LIPASE, AMYLASE in the last 168 hours. No results for input(s): AMMONIA in the last 168 hours. Coagulation Profile: Recent Labs  Lab 05/05/20 1606 05/06/20 0525  INR 1.6* 1.7*   Cardiac Enzymes: No results for input(s): CKTOTAL, CKMB, CKMBINDEX, TROPONINI in the last 168 hours. BNP (last 3 results) No results for input(s): PROBNP in the last 8760 hours. HbA1C: Recent Labs    05/05/20 1849  HGBA1C 4.7*   CBG: Recent Labs  Lab 05/07/20 1623 05/07/20 1946 05/08/20 0011 05/08/20 0722 05/08/20 1231  GLUCAP 114* 115* 102* 91 110*   Lipid Profile: No results for input(s): CHOL, HDL, LDLCALC, TRIG, CHOLHDL, LDLDIRECT in the last 72 hours. Thyroid Function Tests: Recent Labs    05/05/20 1849  TSH 2.701   Anemia Panel: No results for input(s): VITAMINB12, FOLATE, FERRITIN, TIBC, IRON, RETICCTPCT in the last 72 hours. Sepsis Labs: No results for input(s): PROCALCITON, LATICACIDVEN in the last 168  hours.  Recent Results (from the past 240 hour(s))  SARS Coronavirus 2 by RT PCR (hospital order, performed in [facility] hospital lab) Nasopharyngeal Nasopharyngeal Swab     Status: None   Collection Time: 05/05/20  4:23 PM   Specimen: Nasopharyngeal Swab  Result Value Ref Range Status   SARS Coronavirus 2 NEGATIVE NEGATIVE Final    Comment: (NOTE) SARS-CoV-2 target nucleic acids are NOT DETECTED.  The SARS-CoV-2 RNA is generally detectable in upper and lower respiratory specimens during the acute phase of infection. The lowest concentration of SARS-CoV-2 viral copies this assay can detect is 250 copies / mL. A negative result does not preclude SARS-CoV-2 infection and should not be used as the sole basis for treatment or other patient management decisions.  A negative result may occur with improper specimen collection / handling, submission of specimen other than nasopharyngeal swab, presence of viral mutation(s) within the areas targeted by this assay, and inadequate number of viral copies (<250 copies / mL). A negative result must be combined with clinical observations, patient history, and epidemiological information.  Fact Sheet for Patients:   StrictlyIdeas.no  Fact Sheet for Healthcare Providers: BankingDealers.co.za  This test is not yet approved or  cleared by the [place] FDA and has been authorized for detection and/or diagnosis of SARS-CoV-2 by FDA under an Emergency Use Authorization (EUA).  This EUA will remain in effect (meaning this test can be used) for the duration of the COVID-19 declaration under Section 564(b)(1) of the Act, 21 U.S.C. section 360bbb-3(b)(1), unless the authorization  is terminated or revoked sooner.  Performed at [facility], [address] [place], [place] [address]   MRSA PCR Screening     Status: None   Collection Time: 05/05/20  6:30 PM   Specimen: Nasal Mucosa;  Nasopharyngeal  Result Value Ref Range Status   MRSA by PCR NEGATIVE NEGATIVE Final    Comment:        The GeneXpert MRSA Assay (FDA approved for NASAL specimens only), is one component of a comprehensive MRSA colonization surveillance program. It is not intended to diagnose MRSA infection nor to guide or monitor treatment for MRSA infections. Performed at [facility], [address], [place], [place] [address]          Radiology Studies: ECHOCARDIOGRAM COMPLETE  Result Date: 05/06/2020    ECHOCARDIOGRAM REPORT   Patient Name:   CRISANTO NIED Date of Exam: 05/06/2020 Medical Rec #:  [id number]           Height:       69.0 in Accession #:    [id number]          Weight:       235.2 lb Date of Birth:  03/01/1946            BSA:          2.213 m Patient Age:    62 years            BP:           119/44 mmHg Patient Gender: M                   HR:           50 bpm. Exam Location:  [facility] Procedure: 2D Echo, Cardiac Doppler and Color Doppler Indications:     CHF-Acute Diastolic 376.28 / B15.17  History:         Patient has no prior history of Echocardiogram examinations.                  Arrythmias:Atrial Fibrillation; Risk Factors:Hypertension.  Sonographer:     Alyse Low Roar Referring Phys:  [place] Diagnosing Phys: Neoma Laming MD IMPRESSIONS  1. Left ventricular ejection fraction, by estimation, is 40 to 45%. The left ventricle has mildly decreased function. The left ventricle demonstrates global hypokinesis. The left ventricular internal cavity size was severely dilated. There is mild concentric left ventricular hypertrophy. Left ventricular diastolic parameters are consistent with Grade II diastolic dysfunction (pseudonormalization).  2. Right ventricular systolic function is moderately reduced. The right ventricular size is moderately enlarged. There is moderately elevated pulmonary artery systolic pressure.  3. Left atrial size was severely dilated.  4. Right  atrial size was severely dilated.  5. The mitral valve is degenerative. Mild mitral valve regurgitation.  6. The tricuspid valve is myxomatous.  7. The aortic valve is abnormal. Aortic valve regurgitation is mild. Mild to moderate aortic valve sclerosis/calcification is present, without any evidence of aortic stenosis. FINDINGS  Left Ventricle: Left ventricular ejection fraction, by estimation, is 40 to 45%. The left ventricle has mildly decreased function. The left ventricle demonstrates global hypokinesis. The left ventricular internal cavity size was severely dilated. There is mild concentric left ventricular hypertrophy. Left ventricular diastolic parameters are consistent with Grade II diastolic dysfunction (pseudonormalization). Right Ventricle: The right ventricular size is moderately enlarged. No increase in right ventricular wall thickness. Right ventricular systolic function is moderately reduced. There is moderately elevated pulmonary artery systolic pressure.  The tricuspid  regurgitant velocity is 3.26 m/s, and with an assumed right atrial pressure of 10 mmHg, the estimated right ventricular systolic pressure is 02.5 mmHg. Left Atrium: Left atrial size was severely dilated. Right Atrium: Right atrial size was severely dilated. Pericardium: Trivial pericardial effusion is present. Mitral Valve: The mitral valve is degenerative in appearance. Moderate to severe mitral annular calcification. Mild mitral valve regurgitation. Tricuspid Valve: The tricuspid valve is myxomatous. Tricuspid valve regurgitation is mild. Aortic Valve: The aortic valve is abnormal. Aortic valve regurgitation is mild. Mild to moderate aortic valve sclerosis/calcification is present, without any evidence of aortic stenosis. Aortic valve mean gradient measures 8.0 mmHg. Aortic valve peak gradient measures 15.2 mmHg. Aortic valve area, by VTI measures 1.35 cm. Pulmonic Valve: The pulmonic valve was normal in structure. Pulmonic valve  regurgitation is mild. Aorta: The aortic root was not well visualized. IAS/Shunts: The interatrial septum was not well visualized.  LEFT VENTRICLE PLAX 2D LVIDd:         5.08 cm  Diastology LVIDs:         3.79 cm  LV e' lateral:   11.40 cm/s LV PW:         1.15 cm  LV E/e' lateral: 11.6 LV IVS:        1.18 cm  LV e' medial:    8.16 cm/s LVOT diam:     1.70 cm  LV E/e' medial:  16.2 LV SV:         58 LV SV Index:   26 LVOT Area:     2.27 cm  RIGHT VENTRICLE RV Mid diam:    4.35 cm RV S prime:     9.57 cm/s TAPSE (M-mode): 1.5 cm LEFT ATRIUM              Index       RIGHT ATRIUM           Index LA diam:        4.90 cm  2.21 cm/m  RA Area:     24.70 cm LA Vol (A2C):   113.0 ml 51.05 ml/m RA Volume:   78.60 ml  35.51 ml/m LA Vol (A4C):   141.0 ml 63.70 ml/m LA Biplane Vol: 135.0 ml 60.99 ml/m  AORTIC VALVE                    PULMONIC VALVE AV Area (Vmax):    1.23 cm     PV Vmax:        1.09 m/s AV Area (Vmean):   1.20 cm     PV Peak grad:   4.8 mmHg AV Area (VTI):     1.35 cm     RVOT Peak grad: 1 mmHg AV Vmax:           195.00 cm/s AV Vmean:          131.000 cm/s AV VTI:            0.427 m AV Peak Grad:      15.2 mmHg AV Mean Grad:      8.0 mmHg LVOT Vmax:         106.00 cm/s LVOT Vmean:        69.000 cm/s LVOT VTI:          0.254 m LVOT/AV VTI ratio: 0.59  AORTA Ao Root diam: 2.30 cm MITRAL VALVE                TRICUSPID VALVE MV  Area (PHT): 4.96 cm     TR Peak grad:   42.5 mmHg MV Decel Time: 153 msec     TR Vmax:        326.00 cm/s MV E velocity: 132.00 cm/s MV A velocity: 45.10 cm/s   SHUNTS MV E/A ratio:  2.93         Systemic VTI:  0.25 m                             Systemic Diam: 1.70 cm Neoma Laming MD Electronically signed by Neoma Laming MD Signature Date/Time: 05/06/2020/4:40:30 PM    Final         Scheduled Meds: . sodium chloride   Intravenous Once  . sodium chloride   Intravenous Once  . atenolol  25 mg Oral BID  . Chlorhexidine Gluconate Cloth  6 each Topical Daily  . docusate  sodium  100 mg Oral BID  . furosemide  40 mg Intravenous Q12H  . insulin aspart  0-9 Units Subcutaneous TID WC  . melatonin  2.5 mg Oral QHS  . multivitamin with minerals  1 tablet Oral Daily  . simvastatin  40 mg Oral Daily  . sodium chloride flush  3 mL Intravenous Q12H   Continuous Infusions: . sodium chloride    . cefTRIAXone (ROCEPHIN)  IV 1 g (05/08/20 1236)  . octreotide  (SANDOSTATIN)    IV infusion 50 mcg/hr (05/08/20 1422)  . pantoprozole (PROTONIX) infusion 8 mg/hr (05/08/20 0900)  . pantoprazole (PROTONIX) IVPB       Time spent: 30 minutes with over 50% of the time coordinating the patient's care    Harold Hedge, DO [facility] Pager [phone]  Call night coverage person covering after 7pm

## 2020-05-08 NOTE — Progress Notes (Signed)
Pt maintained vitals WNL throughout shift. SB afib which is his normal. Alert and oriented. Good urine output. Went down for an EGD with no issues. Daughter visited pt. Called report to [place] on 1C. Transferring pt to 1C shortly. Will continue to monitor.

## 2020-05-08 NOTE — Plan of Care (Signed)

## 2020-05-08 NOTE — Transfer of Care (Signed)
Immediate Anesthesia Transfer of Care Note  Patient: Patrick [name]  Procedure(s) Performed: ESOPHAGOGASTRODUODENOSCOPY (EGD) WITH PROPOFOL (N/A )  Patient Location: Endoscopy Unit  Anesthesia Type:General  Level of Consciousness: drowsy  Airway & Oxygen Therapy: Patient Spontanous Breathing and Patient connected to nasal cannula oxygen  Post-op Assessment: Report given to RN and Post -op Vital signs reviewed and stable  Post vital signs: Reviewed and stable  Last Vitals:  Vitals Value Taken Time  BP 110/57 05/08/20 1151  Temp 36 C 05/08/20 1150  Pulse 57 05/08/20 1155  Resp 21 05/08/20 1155  SpO2 100 % 05/08/20 1155  Vitals shown include unvalidated device data.  Last Pain:  Vitals:   05/08/20 1150  TempSrc: Temporal  PainSc: Asleep      Patients Stated Pain Goal: 0 (46/95/07 2257)  Complications: No complications documented.

## 2020-05-08 NOTE — Progress Notes (Signed)
   05/08/20 0755  Clinical Encounter Type  Visited With Patient  Visit Type Initial  Referral From Chaplain  Consult/Referral To Chaplain  Chaplain briefly spoke with patient. He informed chaplain that he is going down for a procedure and chaplain asked if she could pray with him and he said yes. Chaplain prayed and will follow up later today.

## 2020-05-09 ENCOUNTER — Encounter: Payer: Self-pay | Admitting: Gastroenterology

## 2020-05-09 DIAGNOSIS — E8770 Fluid overload, unspecified: Secondary | ICD-10-CM

## 2020-05-09 LAB — CBC
HCT: 29.3 % — ABNORMAL LOW (ref 39.0–52.0)
Hemoglobin: 9.3 g/dL — ABNORMAL LOW (ref 13.0–17.0)
MCH: 27.2 pg (ref 26.0–34.0)
MCHC: 31.7 g/dL (ref 30.0–36.0)
MCV: 85.7 fL (ref 80.0–100.0)
Platelets: 95 10*3/uL — ABNORMAL LOW (ref 150–400)
RBC: 3.42 MIL/uL — ABNORMAL LOW (ref 4.22–5.81)
RDW: 16.5 % — ABNORMAL HIGH (ref 11.5–15.5)
WBC: 5.7 10*3/uL (ref 4.0–10.5)
nRBC: 0 % (ref 0.0–0.2)

## 2020-05-09 LAB — BASIC METABOLIC PANEL
Anion gap: 7 (ref 5–15)
BUN: 31 mg/dL — ABNORMAL HIGH (ref 8–23)
CO2: 29 mmol/L (ref 22–32)
Calcium: 8.4 mg/dL — ABNORMAL LOW (ref 8.9–10.3)
Chloride: 103 mmol/L (ref 98–111)
Creatinine, Ser: 1.79 mg/dL — ABNORMAL HIGH (ref 0.61–1.24)
GFR calc Af Amer: 42 mL/min — ABNORMAL LOW (ref >60)
GFR calc non Af Amer: 37 mL/min — ABNORMAL LOW (ref >60)
Glucose, Bld: 128 mg/dL — ABNORMAL HIGH (ref 70–99)
Potassium: 4 mmol/L (ref 3.5–5.1)
Sodium: 139 mmol/L (ref 135–145)

## 2020-05-09 LAB — GLUCOSE, CAPILLARY
Glucose-Capillary: 139 mg/dL — ABNORMAL HIGH (ref 70–99)
Glucose-Capillary: 131 mg/dL — ABNORMAL HIGH (ref 70–99)
Glucose-Capillary: 151 mg/dL — ABNORMAL HIGH (ref 70–99)
Glucose-Capillary: 126 mg/dL — ABNORMAL HIGH (ref 70–99)

## 2020-05-09 NOTE — Progress Notes (Addendum)
PROGRESS NOTE    Patrick [name]    Code Status: Full Code  ZYS:[id number] DOB: 1946-10-28 DOA: 05/05/2020 LOS: 4 days  PCP: Idelle Crouch, MD CC:  Chief Complaint  Patient presents with  . Anemia       Hospital Summary   He is a pleasant 74 year old male with past medical history of iron deficiency anemia, thrombocytopenia, liver cirrhosis, splenomegaly, alcohol abuse, previous GI bleed, distant tobacco abuse, hypertension, hyperlipidemia, type 2 diabetes, GERD, CAD who presented with generalized weakness, shortness of breath x2 weeks and noted to be severely anemic with Hb 6.0 at his oncology follow-up visit and sent to the ED.  Reportedly had progressive weakness with malaise, nausea vomiting diarrhea and melena.  He was admitted to the stepdown unit with concern for GI bleed and GI was consulted.  Started on octreotide and PPI drip. Also noted to be in A. fib with SVR.  Echo was obtained which showed EF 40 to 45% hypokinesis, severe biatrial dilation, and grade 2 diastolic dysfunction currently reduced RV systolic function started on treatment for heart failure exacerbation.  Cardiology was consulted for cardiac clearance prior to EGD who cleared patient for procedure.  7/12: EGD unremarkable.  A & P   Principal Problem:   GIB (gastrointestinal bleeding) Active Problems:   Absolute anemia   Arterial vascular disease   Diabetes (HCC)   Acid reflux   Fecal occult blood test positive   HLD (hyperlipidemia)   Arthritis, degenerative   1. Acute blood loss anemia secondary to acute GI bleed, suspected AVMs, stable a. Hb 6.3 on presentation, S/p 2 unit PRBCs, now stable b. Cardiology consulted for cardiac clearance, cleared for GI procedures c. Stop octreotide and PPI drip, start Prilosec 40 mg p.o. d. ceftriaxone discontinued e. EGD 7/12: overall unremarkable but food in stomach.   f. Outpatient follow-up for repeat EGD +/-colonoscopy +/-capsule study  2. Acute hypoxic  respiratory failure secondary to diastolic heart failure exacerbation a. Echo 7/11: EF 40 to 45% with mildly decreased LV systolic function, LV global hypokinesis with grade 2 diastolic dysfunction and reduced RV systolic function as well as severely dilated right and left atria and myxomatous TV, mild AR b. Respiratory status has improved IV diuresis though he still seems volume overloaded on exam with improved renal function after diuresi c. Continue Lasix IV twice daily for today and consider switching to p.o. tomorrow d. Low-sodium diet e. I/O and daily weights  3. Atrial fibrillation with SVR a. Not on anticoagulation due to current and history of GI bleed b. Per cardiology, okay to continue atenolol at low-dose for HR control  4. Iron deficiency anemia a. Gets IV iron outpatient b. Ferritin 22 c. Given IV iron on admission d. GI studies as above  5. History of cirrhosis a. Meld score 21: Estimated 19.6% 25-month mortality b. Lasix as above c. GI signed off  6. Supratherapeutic INR a. likely from cirrhosis  7. AKI, Multifactorial: GI bleed, heart failure, improved a. Creatinine peaked at 2.18, trending down in 1.79 today.  Creatinine was near 1.0 in 2019 b. Continue diuresis, follow-up in a.m.   DVT prophylaxis: SCDs Start: 05/05/20 1635 Place TED hose Start: 05/05/20 1635   Family Communication: no family at bedside  Disposition Plan: DC pending volume status, likely discharge in 24 to 40 hours.  PT/OT recommending home health but patient refused  Status is: Inpatient  Remains inpatient appropriate because:Inpatient level of care appropriate due to severity of illness  Dispo: The patient is from: Home              Anticipated d/c is to: Home              Anticipated d/c date is: 1 day              Patient currently is not medically stable to d/c.          Pressure injury documentation    None  Consultants  GI Cardio  Procedures  EGD  7/12  Antibiotics   Anti-infectives (From admission, onward)   Start     Dose/Rate Route Frequency Ordered Stop   05/07/20 1000  cefTRIAXone (ROCEPHIN) 1 g in sodium chloride 0.9 % 100 mL IVPB  Status:  Discontinued        1 g 200 mL/hr over 30 Minutes Intravenous Every 24 hours 05/07/20 0954 05/08/20 1714   05/05/20 1630  cefTRIAXone (ROCEPHIN) 1 g in sodium chloride 0.9 % 100 mL IVPB        1 g 200 mL/hr over 30 Minutes Intravenous  Once 05/05/20 1619 05/05/20 1740        Subjective   Patient doing well and feels his respiratory status has improved significantly.  He denies any complaints at this time.  No overnight events.    Objective   Vitals:   05/09/20 0500 05/09/20 0752 05/09/20 1131 05/09/20 1608  BP:  (!) 122/47 (!) 150/125 113/63  Pulse:  (!) 50 (!) 59 (!) 50  Resp:  20 (!) 22 20  Temp:  98.3 F (36.8 C) 98.3 F (36.8 C) 98.4 F (36.9 C)  TempSrc:  Oral Oral Oral  SpO2:  93% 97% 99%  Weight: 101.8 kg     Height:        Intake/Output Summary (Last 24 hours) at 05/09/2020 1635 Last data filed at 05/09/2020 1338 Gross per 24 hour  Intake 535.41 ml  Output 2000 ml  Net -1464.59 ml   Filed Weights   05/08/20 0259 05/08/20 1038 05/09/20 0500  Weight: 106.4 kg 106.6 kg 101.8 kg    Examination:  Physical Exam Vitals and nursing note reviewed.  Constitutional:      Appearance: Normal appearance.  HENT:     Head: Normocephalic and atraumatic.  Eyes:     Conjunctiva/sclera: Conjunctivae normal.  Cardiovascular:     Rate and Rhythm: Bradycardia present. Rhythm irregular.  Pulmonary:     Effort: Pulmonary effort is normal.     Breath sounds: Normal breath sounds.  Abdominal:     General: Abdomen is flat.     Palpations: Abdomen is soft.  Musculoskeletal:        General: No swelling or tenderness.     Right lower leg: 1+ Edema present.     Left lower leg: 1+ Edema present.  Skin:    Coloration: Skin is not jaundiced or pale.  Neurological:      Mental Status: He is alert. Mental status is at baseline.  Psychiatric:        Mood and Affect: Mood normal.        Behavior: Behavior normal.     Data Reviewed: I have personally reviewed following labs and imaging studies  CBC: Recent Labs  Lab 05/05/20 1405 05/05/20 1405 05/06/20 0525 05/06/20 0525 05/06/20 0900 05/06/20 1743 05/07/20 0717 05/08/20 0500 05/09/20 0453  WBC 4.7  --  4.4  --   --   --  5.0 5.0 5.7  NEUTROABS 3.3  --   --   --   --   --   --   --   --  HGB 6.3*   < > 7.1*   < > 7.4* 8.3* 8.6* 9.0* 9.3*  HCT 20.2*   < > 22.9*   < > 23.7* 26.3* 26.9* 26.6* 29.3*  MCV 83.5  --  84.5  --   --   --  85.1 83.1 85.7  PLT 101*  --  90*  --   --   --  88* 85* 95*   < > = values in this interval not displayed.   Basic Metabolic Panel: Recent Labs  Lab 05/05/20 1405 05/05/20 1405 05/05/20 1849 05/06/20 0525 05/07/20 0717 05/08/20 0500 05/09/20 0453  NA 138  --   --  138 137 139 139  K 4.5  --   --  5.0 4.4 4.2 4.0  CL 109  --   --  108 106 107 103  CO2 20*  --   --  22 23 26 29   GLUCOSE 109*  --   --  113* 112* 109* 128*  BUN 33*  --   --  33* 33* 35* 31*  CREATININE 1.80*  --   --  1.81* 2.18* 2.10* 1.79*  CALCIUM 8.5*   < > 8.7* 8.4* 8.2* 8.0* 8.4*  MG  --   --  1.9  --  1.7 1.7  --   PHOS  --   --  4.1  --   --   --   --    < > = values in this interval not displayed.   GFR: Estimated Creatinine Clearance: 42.6 mL/min (A) (by C-G formula based on SCr of 1.79 mg/dL (H)). Liver Function Tests: Recent Labs  Lab 05/05/20 1405 05/08/20 0500  AST 22 19  ALT 11 10  ALKPHOS 63 55  BILITOT 1.4* 1.5*  PROT 7.4 6.3*  ALBUMIN 3.1* 2.8*   No results for input(s): LIPASE, AMYLASE in the last 168 hours. No results for input(s): AMMONIA in the last 168 hours. Coagulation Profile: Recent Labs  Lab 05/05/20 1606 05/06/20 0525  INR 1.6* 1.7*   Cardiac Enzymes: No results for input(s): CKTOTAL, CKMB, CKMBINDEX, TROPONINI in the last 168 hours. BNP  (last 3 results) No results for input(s): PROBNP in the last 8760 hours. HbA1C: No results for input(s): HGBA1C in the last 72 hours. CBG: Recent Labs  Lab 05/08/20 1610 05/08/20 2120 05/09/20 0751 05/09/20 1132 05/09/20 1609  GLUCAP 201* 102* 139* 131* 151*   Lipid Profile: No results for input(s): CHOL, HDL, LDLCALC, TRIG, CHOLHDL, LDLDIRECT in the last 72 hours. Thyroid Function Tests: No results for input(s): TSH, T4TOTAL, FREET4, T3FREE, THYROIDAB in the last 72 hours. Anemia Panel: No results for input(s): VITAMINB12, FOLATE, FERRITIN, TIBC, IRON, RETICCTPCT in the last 72 hours. Sepsis Labs: No results for input(s): PROCALCITON, LATICACIDVEN in the last 168 hours.  Recent Results (from the past 240 hour(s))  SARS Coronavirus 2 by RT PCR (hospital order, performed in [facility] hospital lab) Nasopharyngeal Nasopharyngeal Swab     Status: None   Collection Time: 05/05/20  4:23 PM   Specimen: Nasopharyngeal Swab  Result Value Ref Range Status   SARS Coronavirus 2 NEGATIVE NEGATIVE Final    Comment: (NOTE) SARS-CoV-2 target nucleic acids are NOT DETECTED.  The SARS-CoV-2 RNA is generally detectable in upper and lower respiratory specimens during the acute phase of infection. The lowest concentration of SARS-CoV-2 viral copies this assay can detect is 250 copies / mL. A negative result does not preclude SARS-CoV-2 infection and should not be used as the  sole basis for treatment or other patient management decisions.  A negative result may occur with improper specimen collection / handling, submission of specimen other than nasopharyngeal swab, presence of viral mutation(s) within the areas targeted by this assay, and inadequate number of viral copies (<250 copies / mL). A negative result must be combined with clinical observations, patient history, and epidemiological information.  Fact Sheet for Patients:   StrictlyIdeas.no  Fact Sheet for  Healthcare Providers: BankingDealers.co.za  This test is not yet approved or  cleared by the [place] FDA and has been authorized for detection and/or diagnosis of SARS-CoV-2 by FDA under an Emergency Use Authorization (EUA).  This EUA will remain in effect (meaning this test can be used) for the duration of the COVID-19 declaration under Section 564(b)(1) of the Act, 21 U.S.C. section 360bbb-3(b)(1), unless the authorization is terminated or revoked sooner.  Performed at [facility], [address] [place], [place] [address]   MRSA PCR Screening     Status: None   Collection Time: 05/05/20  6:30 PM   Specimen: Nasal Mucosa; Nasopharyngeal  Result Value Ref Range Status   MRSA by PCR NEGATIVE NEGATIVE Final    Comment:        The GeneXpert MRSA Assay (FDA approved for NASAL specimens only), is one component of a comprehensive MRSA colonization surveillance program. It is not intended to diagnose MRSA infection nor to guide or monitor treatment for MRSA infections. Performed at [facility], [address], [place], [place] [address]          Radiology Studies: No results found.      Scheduled Meds: . sodium chloride   Intravenous Once  . sodium chloride   Intravenous Once  . atenolol  25 mg Oral BID  . Chlorhexidine Gluconate Cloth  6 each Topical Daily  . docusate sodium  100 mg Oral BID  . furosemide  40 mg Intravenous Q12H  . insulin aspart  0-9 Units Subcutaneous TID WC  . melatonin  2.5 mg Oral QHS  . multivitamin with minerals  1 tablet Oral Daily  . pantoprazole  40 mg Oral Daily  . simvastatin  40 mg Oral Daily  . sodium chloride flush  3 mL Intravenous Q12H   Continuous Infusions: . sodium chloride       Time spent: 26 minutes with over 50% of the time coordinating the patient's care    Harold Hedge, DO [facility] Pager [phone]  Call night coverage person covering after 7pm

## 2020-05-09 NOTE — Anesthesia Postprocedure Evaluation (Signed)
Anesthesia Post Note  Patient: Patrick [name]  Procedure(s) Performed: ESOPHAGOGASTRODUODENOSCOPY (EGD) WITH PROPOFOL (N/A )  Patient location during evaluation: Endoscopy Anesthesia Type: General Level of consciousness: awake and alert Pain management: pain level controlled Vital Signs Assessment: post-procedure vital signs reviewed and stable Respiratory status: spontaneous breathing, nonlabored ventilation, respiratory function stable and patient connected to nasal cannula oxygen Cardiovascular status: blood pressure returned to baseline and stable Postop Assessment: no apparent nausea or vomiting Anesthetic complications: no   No complications documented.   Last Vitals:  Vitals:   05/09/20 0358 05/09/20 0752  BP: 138/64 (!) 122/47  Pulse: (!) 53 (!) 50  Resp: 20 20  Temp: 37.2 C 36.8 C  SpO2: 99% 93%    Last Pain:  Vitals:   05/09/20 0752  TempSrc: Oral  PainSc:                  [name]

## 2020-05-09 NOTE — Progress Notes (Signed)
Occupational Therapy Treatment Patient Details Name: Patrick [name] MRN: [id number] DOB: [birth year] Today's Date: 05/09/2020    History of present illness Isidore B Mesta is a 74 y.o. male with medical history significant of   iron deficiency anemia, thrombocytopenia, liver cirrhosis/splenomegal, (history of alcohol abuse, tobacco abuse), hypertension, hyperlipidemia, diabetes mellitus type II, GERD, CAD. Presented with generalized weakness, shortness of breath-noted to be severely anemic at his oncology follow-up visit today. S/p Upper GI endoscopy (7/12).   OT comments  Mr Purk presents this morning on 2L O2 with SpO2 98% while semi-reclined in bed. He was agreeable to treatment and attempting mobility. He completed bed mobility with MOD I and sat EOB x15 min to improve sitting balance/tolerance during education. SpO2 95%, removed oxygen and SpO2 remained >89% on room air. Pt requires MAX A for donning B socks. Demonstrated to pt adaptive strategies and equipment for LBD. Pt was respectful, but declined trial of strategies/equipment stating, "my wife just helps me." Pt completed STS transfer with two lateral steps with MIN A, declining use of RW and demonstrating some lack of awareness of deficits. Required close CGA to maintain balance. SpO2 at 85% on room air in standing. Educated pt re: pursed lip breathing, positioning and pacing of activity for energy conservation (ECS). Handout provided. Pt will benefit from continued OT services for further education and implementation of ECS during ADL participation. Continue to recommend [place] upon discharge. Pt left in bed, with oxygen reapplied and SpO2 93%.        Follow Up Recommendations  Home health OT;Supervision/Assistance - 24 hour    Equipment Recommendations  3 in 1 bedside commode    Recommendations for Other Services      Precautions / Restrictions Precautions Precautions: Fall Restrictions Weight Bearing Restrictions:  No Other Position/Activity Restrictions: on O2, monitor SpO2       Mobility Bed Mobility Overal bed mobility: Needs Assistance Bed Mobility: Supine to Sit;Sit to Supine     Supine to sit: HOB elevated;Supervision Sit to supine: Supervision      Transfers Overall transfer level: Needs assistance   Transfers: Sit to/from Stand Sit to Stand: Min assist         General transfer comment: Pt declined use of RW, decreased awareness of deficits noted    Balance Overall balance assessment: Needs assistance;History of Falls Sitting-balance support: Feet supported Sitting balance-Leahy Scale: Good Sitting balance - Comments: able to maintain balance with dynamic movement   Standing balance support: Single extremity supported Standing balance-Leahy Scale: Fair Standing balance comment: close CGA for stability and safety, able to weight shift for two lateral steps with CGA                           ADL either performed or assessed with clinical judgement   ADL Overall ADL's : Needs assistance/impaired                                       General ADL Comments: MAX A required for donning B socks due to difficulties with LB access. Pt independently incorporated LUE during clothing readjustment. MIN A for STS transfer, without AD. SpO2 monitored on RA- started at 95%, decreased to 85% following transfer.     Vision       Perception     Praxis      Cognition Arousal/Alertness: Awake/alert Behavior  During Therapy: WFL for tasks assessed/performed Overall Cognitive Status: Within Functional Limits for tasks assessed                                 General Comments: A&O x4, pleasant, prefers doing things "the old way"- encouragement provided for adaptive ADL strategy use        Exercises Other Exercises Other Exercises: Pt educated re: adaptive equipment for LBD, pursed lip breathing, energy conservation strategies. ECS handout  provided. Pt would benefit from reinforcement. Other Exercises: Facilitated safe STS transfer, educating pt on safety awareness; sitting balance tolerance at EOB   Shoulder Instructions       General Comments HR in 50s t/o session, RN reports pt is bradycardic at baseline. External catheter with slight leak. Pt on 2L [place] oxygen on arrival, SpO2 98% in lying. SpO2 at 95% in sitting EOB. Removed oxygen and pt maintained >89% while seated. SpO2 decreased to 85% following STS transfer. Pt returned to lying and [place] reapplied. SpO2 93% at end of session.    Pertinent Vitals/ Pain       Pain Assessment: No/denies pain  Home Living                                          Prior Functioning/Environment              Frequency  Min 1X/week        Progress Toward Goals  OT Goals(current goals can now be found in the care plan section)  Progress towards OT goals: Progressing toward goals  Acute Rehab OT Goals Patient Stated Goal: To breathe easier OT Goal Formulation: With patient Time For Goal Achievement: 05/21/20 Potential to Achieve Goals: Good  Plan Discharge plan remains appropriate;Frequency remains appropriate    Co-evaluation                 AM-PAC OT "6 Clicks" Daily Activity     Outcome Measure   Help from another person eating meals?: None Help from another person taking care of personal grooming?: A Little Help from another person toileting, which includes using toliet, bedpan, or urinal?: A Little Help from another person bathing (including washing, rinsing, drying)?: A Little Help from another person to put on and taking off regular upper body clothing?: A Little Help from another person to put on and taking off regular lower body clothing?: A Little (Total for socks) 6 Click Score: 19    End of Session Equipment Utilized During Treatment: Oxygen;Gait belt  OT Visit Diagnosis: Other abnormalities of gait and mobility (R26.89);History  of falling (Z91.81);Muscle weakness (generalized) (M62.81)   Activity Tolerance Patient tolerated treatment well   Patient Left in bed;with call bell/phone within reach;with bed alarm set   Nurse Communication Mobility status;Other (comment) (oxygen, external catheter leak)        Time: 0086-7619 OT Time Calculation (min): 32 min  Charges: OT General Charges $OT Visit: 1 Visit OT Treatments $Self Care/Home Management : 8-22 mins $Therapeutic Activity: 8-22 mins  Jerilynn Birkenhead, OTS 05/09/20, 11:18 AM

## 2020-05-09 NOTE — Care Management Important Message (Signed)
Important Message  Patient Details  Name: Patrick [name] MRN: [id number] Date of Birth: [birth year]   Medicare Important Message Given:  Yes     Juliann Pulse A Davin Archuletta 05/09/2020, 9:18 AM

## 2020-05-09 NOTE — Progress Notes (Signed)
[facility] Encounter Note  Patient: Patrick [name] / Admit Date: 05/05/2020 / Date of Encounter: 05/09/2020, 2:51 PM   Subjective: Patient feels well today after endoscopy.  No evidence of GI bleeding shortness of breath chest pain or other significant cardiac symptoms.  Patient's heart rate is controlled at 56 bpm but still atrial fibrillation.  Patient has had echocardiogram recently showing ejection fraction of 45% most consistent with atrial fibrillation with some valvular heart disease including moderate aortic valve stenosis unchanged from before.  Stress test last year showed normal myocardial perfusion without evidence of myocardial ischemia.  The patient has not had any myocardial infarction or other significant symptoms at this time   Overall edema has been improved with intravenous Lasix Heart rate control of atrial fibrillation still stable Review of Systems: Positive for: None Negative for: Vision change, hearing change, syncope, dizziness, nausea, vomiting,diarrhea, bloody stool, stomach pain, cough, congestion, diaphoresis, urinary frequency, urinary pain,skin lesions, skin rashes Others previously listed  Objective: Telemetry: Atrial fibrillation with controlled ventricular rate Physical Exam: Blood pressure (!) 150/125, pulse (!) 59, temperature 98.3 F (36.8 C), temperature source Oral, resp. rate (!) 22, height 5\' 9"  (1.753 m), weight 101.8 kg, SpO2 97 %. Body mass index is 33.14 kg/m. General: Well developed, well nourished, in no acute distress. Head: Normocephalic, atraumatic, sclera non-icteric, no xanthomas, nares are without discharge. Neck: No apparent masses Lungs: Normal respirations with few wheezes, no rhonchi, no rales , no crackles   Heart: Irregular rate and rhythm, normal S1 S2, no murmur, no rub, no gallop, PMI is normal size and placement, carotid upstroke normal without bruit, jugular venous pressure normal Abdomen: Soft,  non-tender, non-distended with normoactive bowel sounds. No hepatosplenomegaly. Abdominal aorta is normal size without bruit Extremities: Trace edema, no clubbing, no cyanosis, no ulcers,  Peripheral: 2+ radial, 2+ femoral, 2+ dorsal pedal pulses Neuro: Alert and oriented. Moves all extremities spontaneously. Psych:  Responds to questions appropriately with a normal affect.   Intake/Output Summary (Last 24 hours) at 05/09/2020 1451 Last data filed at 05/09/2020 1338 Gross per 24 hour  Intake 535.41 ml  Output 2000 ml  Net -1464.59 ml    Inpatient Medications:  . sodium chloride   Intravenous Once  . sodium chloride   Intravenous Once  . atenolol  25 mg Oral BID  . Chlorhexidine Gluconate Cloth  6 each Topical Daily  . docusate sodium  100 mg Oral BID  . furosemide  40 mg Intravenous Q12H  . insulin aspart  0-9 Units Subcutaneous TID WC  . melatonin  2.5 mg Oral QHS  . multivitamin with minerals  1 tablet Oral Daily  . pantoprazole  40 mg Oral Daily  . simvastatin  40 mg Oral Daily  . sodium chloride flush  3 mL Intravenous Q12H   Infusions:  . sodium chloride      Labs: Recent Labs    05/07/20 0717 05/07/20 0717 05/08/20 0500 05/09/20 0453  NA 137   < > 139 139  K 4.4   < > 4.2 4.0  CL 106   < > 107 103  CO2 23   < > 26 29  GLUCOSE 112*   < > 109* 128*  BUN 33*   < > 35* 31*  CREATININE 2.18*   < > 2.10* 1.79*  CALCIUM 8.2*   < > 8.0* 8.4*  MG 1.7  --  1.7  --    < > = values in this interval not  displayed.   Recent Labs    05/08/20 0500  AST 19  ALT 10  ALKPHOS 55  BILITOT 1.5*  PROT 6.3*  ALBUMIN 2.8*   Recent Labs    05/08/20 0500 05/09/20 0453  WBC 5.0 5.7  HGB 9.0* 9.3*  HCT 26.6* 29.3*  MCV 83.1 85.7  PLT 85* 95*   No results for input(s): CKTOTAL, CKMB, TROPONINI in the last 72 hours. Invalid input(s): POCBNP No results for input(s): HGBA1C in the last 72 hours.   Weights: Filed Weights   05/08/20 0259 05/08/20 1038 05/09/20 0500   Weight: 106.4 kg 106.6 kg 101.8 kg     Radiology/Studies:  DG Chest Portable 1 View  Result Date: 05/05/2020 CLINICAL DATA:  Shortness of breath EXAM: PORTABLE CHEST 1 VIEW COMPARISON:  10/29/2018, 06/09/2018 CT 03/10/2019 FINDINGS: Cardiomegaly with vascular congestion and diffuse interstitial opacities suspicious for pulmonary edema. Small bilateral pleural effusions with possible loculation on the left. Volume loss left thorax. Hazy opacity at the left lung base. Aortic atherosclerosis. Chronic left-sided rib fractures. IMPRESSION: 1. Cardiomegaly with vascular congestion and diffuse interstitial opacities suspicious for pulmonary edema. Small bilateral pleural effusions with possible loculation on the left. 2. Hazy opacity at the left base, may be due to combination of pleural effusion and consolidation/possible round atelectasis noted on prior CT. Chest CT follow-up previously recommended. Electronically Signed   By: Donavan Foil M.D.   On: 05/05/2020 16:44   ECHOCARDIOGRAM COMPLETE  Result Date: 05/06/2020    ECHOCARDIOGRAM REPORT   Patient Name:   Patrick [name] Date of Exam: 05/06/2020 Medical Rec #:  [id number]           Height:       69.0 in Accession #:    [id number]          Weight:       235.2 lb Date of Birth:  Apr 30, 1946            BSA:          2.213 m Patient Age:    74 years            BP:           119/44 mmHg Patient Gender: M                   HR:           50 bpm. Exam Location:  [facility] Procedure: 2D Echo, Cardiac Doppler and Color Doppler Indications:     CHF-Acute Diastolic 767.34 / L93.79  History:         Patient has no prior history of Echocardiogram examinations.                  Arrythmias:Atrial Fibrillation; Risk Factors:Hypertension.  Sonographer:     Alyse Low Roar Referring Phys:  [place] Diagnosing Phys: Neoma Laming MD IMPRESSIONS  1. Left ventricular ejection fraction, by estimation, is 40 to 45%. The left ventricle has mildly decreased function. The  left ventricle demonstrates global hypokinesis. The left ventricular internal cavity size was severely dilated. There is mild concentric left ventricular hypertrophy. Left ventricular diastolic parameters are consistent with Grade II diastolic dysfunction (pseudonormalization).  2. Right ventricular systolic function is moderately reduced. The right ventricular size is moderately enlarged. There is moderately elevated pulmonary artery systolic pressure.  3. Left atrial size was severely dilated.  4. Right atrial size was severely dilated.  5. The mitral valve is degenerative. Mild mitral valve regurgitation.  6. The tricuspid valve is myxomatous.  7. The aortic valve is abnormal. Aortic valve regurgitation is mild. Mild to moderate aortic valve sclerosis/calcification is present, without any evidence of aortic stenosis. FINDINGS  Left Ventricle: Left ventricular ejection fraction, by estimation, is 40 to 45%. The left ventricle has mildly decreased function. The left ventricle demonstrates global hypokinesis. The left ventricular internal cavity size was severely dilated. There is mild concentric left ventricular hypertrophy. Left ventricular diastolic parameters are consistent with Grade II diastolic dysfunction (pseudonormalization). Right Ventricle: The right ventricular size is moderately enlarged. No increase in right ventricular wall thickness. Right ventricular systolic function is moderately reduced. There is moderately elevated pulmonary artery systolic pressure. The tricuspid  regurgitant velocity is 3.26 m/s, and with an assumed right atrial pressure of 10 mmHg, the estimated right ventricular systolic pressure is 87.8 mmHg. Left Atrium: Left atrial size was severely dilated. Right Atrium: Right atrial size was severely dilated. Pericardium: Trivial pericardial effusion is present. Mitral Valve: The mitral valve is degenerative in appearance. Moderate to severe mitral annular calcification. Mild mitral  valve regurgitation. Tricuspid Valve: The tricuspid valve is myxomatous. Tricuspid valve regurgitation is mild. Aortic Valve: The aortic valve is abnormal. Aortic valve regurgitation is mild. Mild to moderate aortic valve sclerosis/calcification is present, without any evidence of aortic stenosis. Aortic valve mean gradient measures 8.0 mmHg. Aortic valve peak gradient measures 15.2 mmHg. Aortic valve area, by VTI measures 1.35 cm. Pulmonic Valve: The pulmonic valve was normal in structure. Pulmonic valve regurgitation is mild. Aorta: The aortic root was not well visualized. IAS/Shunts: The interatrial septum was not well visualized.  LEFT VENTRICLE PLAX 2D LVIDd:         5.08 cm  Diastology LVIDs:         3.79 cm  LV e' lateral:   11.40 cm/s LV PW:         1.15 cm  LV E/e' lateral: 11.6 LV IVS:        1.18 cm  LV e' medial:    8.16 cm/s LVOT diam:     1.70 cm  LV E/e' medial:  16.2 LV SV:         58 LV SV Index:   26 LVOT Area:     2.27 cm  RIGHT VENTRICLE RV Mid diam:    4.35 cm RV S prime:     9.57 cm/s TAPSE (M-mode): 1.5 cm LEFT ATRIUM              Index       RIGHT ATRIUM           Index LA diam:        4.90 cm  2.21 cm/m  RA Area:     24.70 cm LA Vol (A2C):   113.0 ml 51.05 ml/m RA Volume:   78.60 ml  35.51 ml/m LA Vol (A4C):   141.0 ml 63.70 ml/m LA Biplane Vol: 135.0 ml 60.99 ml/m  AORTIC VALVE                    PULMONIC VALVE AV Area (Vmax):    1.23 cm     PV Vmax:        1.09 m/s AV Area (Vmean):   1.20 cm     PV Peak grad:   4.8 mmHg AV Area (VTI):     1.35 cm     RVOT Peak grad: 1 mmHg AV Vmax:  195.00 cm/s AV Vmean:          131.000 cm/s AV VTI:            0.427 m AV Peak Grad:      15.2 mmHg AV Mean Grad:      8.0 mmHg LVOT Vmax:         106.00 cm/s LVOT Vmean:        69.000 cm/s LVOT VTI:          0.254 m LVOT/AV VTI ratio: 0.59  AORTA Ao Root diam: 2.30 cm MITRAL VALVE                TRICUSPID VALVE MV Area (PHT): 4.96 cm     TR Peak grad:   42.5 mmHg MV Decel Time: 153 msec      TR Vmax:        326.00 cm/s MV E velocity: 132.00 cm/s MV A velocity: 45.10 cm/s   SHUNTS MV E/A ratio:  2.93         Systemic VTI:  0.25 m                             Systemic Diam: 1.70 cm Neoma Laming MD Electronically signed by Neoma Laming MD Signature Date/Time: 05/06/2020/4:40:30 PM    Final    US LIVER DOPPLER  Result Date: 05/06/2020 CLINICAL DATA:  74 year old male with elevated LFTs, possible cirrhosis EXAM: DUPLEX ULTRASOUND OF LIVER TECHNIQUE: Color and duplex Doppler ultrasound was performed to evaluate the hepatic in-flow and out-flow vessels. COMPARISON:  Right upper quadrant ultrasound performed concurrently FINDINGS: Liver: Please see separately dictated right upper quadrant ultrasound. Main Portal Vein size: 1.4 cm Portal Vein Velocities Main Prox:  65 cm/sec with normal hepatopetal directional flow. Main Mid: 57 cm/sec with normal hepatopetal directional flow. Main Dist:  47 cm/sec with normal hepatopetal directional flow. Right: 17 cm/sec with normal hepatopetal directional flow. Left: 28 cm/sec with normal hepatopetal directional flow. Hepatic Vein Velocities Right:  29 cm/sec Middle:  24 cm/sec Left:  22 cm/sec IVC: Present and patent with normal respiratory phasicity. Hepatic Artery Velocity:  28 cm/sec Splenic Vein Velocity:  65 cm/sec Spleen: 15.8 cm x 14.4 cm x 5.2 cm with a total volume of 617 cm^3 (411 cm^3 is upper limit normal) Portal Vein Occlusion/Thrombus: No Splenic Vein Occlusion/Thrombus: No Ascites: Perihepatic ascites is present. Varices: None IMPRESSION: 1. Hepatic cirrhosis with portal hypertension as evidenced by splenomegaly and mild ascites. 2. The portal and hepatic veins are patent with normal directional flow. Electronically Signed   By: Jacqulynn Cadet M.D.   On: 05/06/2020 14:30   US Abdomen Limited RUQ  Result Date: 05/06/2020 CLINICAL DATA:  Abnormal LFTs EXAM: ULTRASOUND ABDOMEN LIMITED RIGHT UPPER QUADRANT COMPARISON:  Concurrently obtained hepatic  Doppler ultrasound. FINDINGS: Gallbladder: No gallstones or wall thickening visualized. No sonographic Murphy sign noted by sonographer. Common bile duct: Diameter: Normal at 3 mm Liver: Nodular hepatic contour. The hepatic parenchyma is diffusely heterogeneous and coarsened in appearance. No discrete nodule or mass is identified. Portal vein is patent on color Doppler imaging with normal direction of blood flow towards the liver. Other: Trace perihepatic ascites. Partial imaging of the right kidney demonstrates increased echogenicity of the renal parenchyma. IMPRESSION: 1. Hepatic cirrhosis with trace perihepatic ascites. 2. No evidence of cholelithiasis or biliary ductal dilatation. 3. Incidental note is made of increased echogenicity of the renal parenchyma. Does the patient have underlying  medical renal disease? Electronically Signed   By: Jacqulynn Cadet M.D.   On: 05/06/2020 14:28     Assessment and Recommendation  74 y.o. male with known chronic nonvalvular atrial fibrillation mild global LV systolic dysfunction with moderate aortic valve stenosis with acute on chronic congestive heart failure secondary to acute hematemesis anemia chronic kidney disease improved after intravenous Lasix with controlled heart rate and no current evidence of myocardial infarction overall improved  1.  Okay for change in furosemide IV back to oral furosemide for discharge to home due to improved edema and shortness of breath watching closely for worsening chronic kidney disease 2.  Continue supportive care of anemia and chronic kidney disease 3.  No further cardiac intervention and/or diagnostics necessary at this time due to no evidence of myocardial infarction and/or worsening LV dysfunction by echocardiogram and recent stress test showing normal myocardial perfusion 4.  Continue atenolol for heart rate control of chronic nonvalvular atrial fibrillation without change 5.  No anticoagulation due to GI bleed and other  concerns and will readdress as an outpatient 6.  Ambulation and rehabilitation without restriction 7.  Okay for discharge home from cardiac standpoint with current medical regimen of atenolol for heart rate control oral Lasix for heart failure with follow-up in 1 to 2 weeks 8.  Call if further questions otherwise we will assume patient has been discharged Signed, Serafina Royals M.D. FACC

## 2020-05-09 NOTE — TOC Initial Note (Signed)
Transition of Care [facility]) - Initial/Assessment Note    Patient Details  Name: Patrick [name] MRN: [id number] Date of Birth: 23-Sep-1946  Transition of Care [facility]) CM/SW Contact:    Shelbie Hutching, RN Phone Number: 05/09/2020, 9:57 AM  Clinical Narrative:                 Patient admitted to the hospital for GI bleed.  Patient reports feeling much better, sitting up in bed this morning eating breakfast.  RNCM introduced self and explained role.  Patient is from home where he lives with his wife.  Patient reports that he is independent and requires no assistive devices.  Patient has a cane and walker at home if needed but hasn't needed them.  Patient and his wife both drive.  Patient is current with PCP Dr. Doy Hutching and gets his prescriptions from [facility].   Patient does not want home health services at discharge.    Expected Discharge Plan: Home/Self Care Barriers to Discharge: Continued Medical Work up   Patient Goals and CMS Choice Patient states their goals for this hospitalization and ongoing recovery are:: Wants to be able to breath without having to sit and rest      Expected Discharge Plan and Services Expected Discharge Plan: Home/Self Care   Discharge Planning Services: CM Consult   Living arrangements for the past 2 months: Single Family Home                           HH Arranged: Refused HH          Prior Living Arrangements/Services Living arrangements for the past 2 months: Single Family Home Lives with:: Spouse Patient language and need for interpreter reviewed:: Yes Do you feel safe going back to the place where you live?: Yes      Need for Family Participation in Patient Care: Yes (Comment) (GI bleed and weakness) Care giver support system in place?: Yes (comment) (wife) Current home services: DME (cane and walker) Criminal Activity/Legal Involvement Pertinent to Current Situation/Hospitalization: No - Comment as needed  Activities of Daily  Living Home Assistive Devices/Equipment: None ADL Screening (condition at time of admission) Patient's cognitive ability adequate to safely complete daily activities?: Yes Is the patient deaf or have difficulty hearing?: Yes Does the patient have difficulty seeing, even when wearing glasses/contacts?: No Does the patient have difficulty concentrating, remembering, or making decisions?: No Patient able to express need for assistance with ADLs?: No Does the patient have difficulty dressing or bathing?: Yes Independently performs ADLs?: No Does the patient have difficulty walking or climbing stairs?: Yes Weakness of Legs: Both Weakness of Arms/Hands: None  Permission Sought/Granted Permission sought to share information with : Case Manager, Family Supports Permission granted to share information with : Yes, Verbal Permission Granted  Share Information with NAME: Terrence Dupont     Permission granted to share info w Relationship: wife     Emotional Assessment Appearance:: Appears stated age Attitude/Demeanor/Rapport: Engaged Affect (typically observed): Accepting Orientation: : Oriented to Self, Oriented to Place, Oriented to  Time, Oriented to Situation Alcohol / Substance Use: Not Applicable Psych Involvement: No (comment)  Admission diagnosis:  Melena [K92.1] GIB (gastrointestinal bleeding) [K92.2] Symptomatic anemia [D64.9] Hepatic cirrhosis, unspecified hepatic cirrhosis type, unspecified whether ascites present [facility]) [K74.60] Patient Active Problem List   Diagnosis Date Noted  . GIB (gastrointestinal bleeding) 05/05/2020  . Pleural effusion on left 06/08/2018  . Thrombocytopenia ([place]) 02/06/2018  .  Fecal occult blood test positive 09/08/2014  . Alcohol drinker 09/08/2014  . Absolute anemia 04/15/2014  . 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 Diabetes ([place]) 04/15/2014  . Acid reflux 04/15/2014  . 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 HLD (hyperlipidemia) 04/15/2014   . Arthritis, degenerative 04/15/2014   PCP:  Idelle Crouch, MD Pharmacy:   [address], [place] [address] Phone: [phone] Fax: [phone]     Social Determinants of Health (SDOH) Interventions    Readmission Risk Interventions No flowsheet data found.

## 2020-05-10 DIAGNOSIS — I7090 Unspecified atherosclerosis: Secondary | ICD-10-CM

## 2020-05-10 LAB — CBC
HCT: 30.5 % — ABNORMAL LOW (ref 39.0–52.0)
Hemoglobin: 10.2 g/dL — ABNORMAL LOW (ref 13.0–17.0)
MCH: 27.9 pg (ref 26.0–34.0)
MCHC: 33.4 g/dL (ref 30.0–36.0)
MCV: 83.6 fL (ref 80.0–100.0)
Platelets: 101 10*3/uL — ABNORMAL LOW (ref 150–400)
RBC: 3.65 MIL/uL — ABNORMAL LOW (ref 4.22–5.81)
RDW: 16.9 % — ABNORMAL HIGH (ref 11.5–15.5)
WBC: 6.5 10*3/uL (ref 4.0–10.5)
nRBC: 0 % (ref 0.0–0.2)

## 2020-05-10 LAB — BASIC METABOLIC PANEL
Anion gap: 9 (ref 5–15)
BUN: 30 mg/dL — ABNORMAL HIGH (ref 8–23)
CO2: 29 mmol/L (ref 22–32)
Calcium: 8.3 mg/dL — ABNORMAL LOW (ref 8.9–10.3)
Chloride: 100 mmol/L (ref 98–111)
Creatinine, Ser: 1.83 mg/dL — ABNORMAL HIGH (ref 0.61–1.24)
GFR calc Af Amer: 41 mL/min — ABNORMAL LOW (ref >60)
GFR calc non Af Amer: 36 mL/min — ABNORMAL LOW (ref >60)
Glucose, Bld: 121 mg/dL — ABNORMAL HIGH (ref 70–99)
Potassium: 4 mmol/L (ref 3.5–5.1)
Sodium: 138 mmol/L (ref 135–145)

## 2020-05-10 LAB — GLUCOSE, CAPILLARY
Glucose-Capillary: 111 mg/dL — ABNORMAL HIGH (ref 70–99)
Glucose-Capillary: 110 mg/dL — ABNORMAL HIGH (ref 70–99)
Glucose-Capillary: 116 mg/dL — ABNORMAL HIGH (ref 70–99)

## 2020-05-10 MED ORDER — FUROSEMIDE 40 MG PO TABS
40.0000 mg | ORAL_TABLET | Freq: Two times a day (BID) | ORAL | Status: DC
  Administered 2020-05-10 (×2): 40 mg via ORAL
  Filled 2020-05-10 (×3): qty 1

## 2020-05-10 MED ORDER — MAGNESIUM HYDROXIDE 400 MG/5ML PO SUSP
30.0000 mL | Freq: Once | ORAL | Status: DC
  Filled 2020-05-10: qty 30

## 2020-05-10 NOTE — Consult Note (Signed)
PODIATRY / FOOT AND ANKLE SURGERY CONSULTATION NOTE  Requesting Physician: Hosie Poisson, MD  Reason for consult: Nail issues  Chief Complaint: Nail problems   HPI: Patrick [name] is a 74 y.o. male who complains of elongated and thickened toenails to both feet.  Patient was admitted to generalized weakness and short of breath as well as anemia and is being treated for these conditions.  Podiatry team was consulted to take a look at his toenails and trim them as he has extremely long toenails as well as limits loose and red at the medial border of the right hallux.  Patient states that he is diabetic and does have numbness and tingling to bilateral lower extremities.  Patient denies any further complaints at this time.  PMHx:  Past Medical History:  Diagnosis Date  . ASCVD (arteriosclerotic cardiovascular disease)   . Chronic pulmonary hypertension ([place])   . Diabetes mellitus without complication ([place])   . GERD (gastroesophageal reflux disease)   . Heart attack ([place]) 1989  . Heart disease   . Hyperlipidemia   . Hypertension   . IDA (iron deficiency anemia)   . Moderate mitral insufficiency   . Moderate tricuspid insufficiency   . OA (osteoarthritis)   . Paroxysmal A-fib ([place])   . Thrombocytosis ([place])   . Tremor     Surgical Hx:  Past Surgical History:  Procedure Laterality Date  . [place]   with stainless steel stent  . CATARACT EXTRACTION    . Closed Reduction Vertebral Process Fracture  1966  . COLONOSCOPY  10/13/2014  . ESOPHAGOGASTRODUODENOSCOPY (EGD) WITH PROPOFOL N/A 05/08/2020   Procedure: ESOPHAGOGASTRODUODENOSCOPY (EGD) WITH PROPOFOL;  Surgeon: [name], MD;  Location: [facility] ENDOSCOPY;  Service: Gastroenterology;  Laterality: N/A;  . HEMORRHOIDECTOMY WITH HEMORRHOID BANDING    . HERNIA REPAIR     x 2  . KNEE SURGERY     bilateral knee surgery    FHx:  Family History  Problem Relation Age of Onset  . Heart attack Father     Social  History:  reports that he quit smoking about 23 years ago. His smoking use included cigarettes. He has a 20.00 pack-year smoking history. He has never used smokeless tobacco. He reports previous alcohol use of about 50.0 standard drinks of alcohol per week. He reports previous drug use.  Allergies:  Allergies  Allergen Reactions  . Aspirin Other (See Comments)  . Dextrose Other (See Comments)    PATIENT CAN NOT TAKE BLOOD THINNERS OF ANY TYPE     Review of Systems: General ROS: positive for  - fatigue Respiratory ROS: positive for - shortness of breath Cardiovascular ROS: no chest pain or dyspnea on exertion Gastrointestinal ROS: no abdominal pain, change in bowel habits, or black or bloody stools Musculoskeletal ROS: positive for - joint swelling Neurological ROS: positive for - numbness/tingling Dermatological ROS: negative  Medications Prior to Admission  Medication Sig Dispense Refill  . cyanocobalamin 1000 MCG tablet Take 1,000 mcg by mouth daily at 6 (six) AM.    . folic acid (FOLVITE) 1 MG tablet TAKE 1 TABLET BY MOUTH ONCE DAILY (Patient taking differently: Take 1 mg by mouth daily. ) 90 tablet 1  . JANUMET 50-1000 MG tablet Take 1 tablet by mouth at bedtime.    . Multiple Vitamin (MULTIVITAMIN WITH MINERALS) TABS tablet Take 1 tablet by mouth daily.    . potassium chloride SA (K-DUR,KLOR-CON) 20 MEQ tablet Take 1 tablet (20 mEq total) by mouth daily.  30 tablet 0  . simvastatin (ZOCOR) 40 MG tablet Take 40 mg by mouth daily.    Marland Kitchen spironolactone (ALDACTONE) 100 MG tablet Take 50 mg by mouth daily at 6 (six) AM.    . SYMBICORT 160-4.5 MCG/ACT inhaler Inhale 2 puffs into the lungs 2 (two) times daily.    . tamsulosin (FLOMAX) 0.4 MG CAPS capsule Take 0.4 mg by mouth daily at 6 (six) AM.    . thiamine 100 MG tablet Take 1 tablet (100 mg total) by mouth daily. 30 tablet   . torsemide (DEMADEX) 20 MG tablet Take 20 mg by mouth 2 (two) times daily.    Marland Kitchen acetaminophen (TYLENOL) 500 MG  tablet Take 500 mg by mouth every 8 (eight) hours as needed.    . furosemide (LASIX) 40 MG tablet Take 1 tablet by mouth daily. (Patient not taking: Reported on 05/05/2020)  2  . nabumetone (RELAFEN) 500 MG tablet Take 500 mg by mouth at bedtime. (Patient not taking: Reported on 05/05/2020)    . protein supplement shake (PREMIER PROTEIN) LIQD Take 325 mLs (11 oz total) by mouth 2 (two) times daily between meals. (Patient not taking: Reported on 04/28/2019) 60 Can 0  . quiNINE (QUALAQUIN) 324 MG capsule Take 324 mg by mouth daily as needed.      Physical Exam: General: Alert and oriented.  No apparent distress.  Vascular: DP/PT pulses nonpalpable bilateral.  Capillary fill time appears to be intact to digits bilateral.  Moderate bilateral lower extremity nonpitting edema.  No hair growth to digits bilateral.  Neuro: Light touch sensation reduced to digits bilateral.  Derm: Nails x10 thickened, elongated, dystrophic and brittle with subungual debris.  Skin appears to be thin and atrophic to bilateral lower extremities.  No open lesions/ulcerations present to bilateral lower extremities.  Right hallux medial border with some scant amount of bleeding present from onycholytic nail.  Does not appear to be an ingrown nail, no signs of infection present.  MSK: 4/5 strength of bilateral lower extremities.  Results for orders placed or performed during the hospital encounter of 05/05/20 (from the past 48 hour(s))  Glucose, capillary     Status: Abnormal   Collection Time: 05/08/20  4:10 PM  Result Value Ref Range   Glucose-Capillary 201 (H) 70 - 99 mg/dL    Comment: Glucose reference range applies only to samples taken after fasting for at least 8 hours.  Glucose, capillary     Status: Abnormal   Collection Time: 05/08/20  9:20 PM  Result Value Ref Range   Glucose-Capillary 102 (H) 70 - 99 mg/dL    Comment: Glucose reference range applies only to samples taken after fasting for at least 8 hours.  Basic  metabolic panel     Status: Abnormal   Collection Time: 05/09/20  4:53 AM  Result Value Ref Range   Sodium 139 135 - 145 mmol/L   Potassium 4.0 3.5 - 5.1 mmol/L   Chloride 103 98 - 111 mmol/L   CO2 29 22 - 32 mmol/L   Glucose, Bld 128 (H) 70 - 99 mg/dL    Comment: Glucose reference range applies only to samples taken after fasting for at least 8 hours.   BUN 31 (H) 8 - 23 mg/dL   Creatinine, Ser 1.79 (H) 0.61 - 1.24 mg/dL   Calcium 8.4 (L) 8.9 - 10.3 mg/dL   GFR calc non Af Amer 37 (L) >60 mL/min   GFR calc Af Amer 42 (L) >60 mL/min  Anion gap 7 5 - 15    Comment: Performed at [facility], [address] [place], [place] [address]  CBC     Status: Abnormal   Collection Time: 05/09/20  4:53 AM  Result Value Ref Range   WBC 5.7 4.0 - 10.5 K/uL   RBC 3.42 (L) 4.22 - 5.81 MIL/uL   Hemoglobin 9.3 (L) 13.0 - 17.0 g/dL   HCT 29.3 (L) 39 - 52 %   MCV 85.7 80.0 - 100.0 fL   MCH 27.2 26.0 - 34.0 pg   MCHC 31.7 30.0 - 36.0 g/dL   RDW 16.5 (H) 11.5 - 15.5 %   Platelets 95 (L) 150 - 400 K/uL    Comment: CONSISTENT WITH PREVIOUS RESULT Immature Platelet Fraction may be clinically indicated, consider ordering this additional test [id number]    nRBC 0.0 0.0 - 0.2 %    Comment: Performed at [facility], [address] [place], [place] [address]  Glucose, capillary     Status: Abnormal   Collection Time: 05/09/20  7:51 AM  Result Value Ref Range   Glucose-Capillary 139 (H) 70 - 99 mg/dL    Comment: Glucose reference range applies only to samples taken after fasting for at least 8 hours.  Glucose, capillary     Status: Abnormal   Collection Time: 05/09/20 11:32 AM  Result Value Ref Range   Glucose-Capillary 131 (H) 70 - 99 mg/dL    Comment: Glucose reference range applies only to samples taken after fasting for at least 8 hours.  Glucose, capillary     Status: Abnormal   Collection Time: 05/09/20  4:09 PM  Result Value Ref Range   Glucose-Capillary 151 (H) 70 - 99  mg/dL    Comment: Glucose reference range applies only to samples taken after fasting for at least 8 hours.  Glucose, capillary     Status: Abnormal   Collection Time: 05/09/20  9:15 PM  Result Value Ref Range   Glucose-Capillary 126 (H) 70 - 99 mg/dL    Comment: Glucose reference range applies only to samples taken after fasting for at least 8 hours.  Basic metabolic panel     Status: Abnormal   Collection Time: 05/10/20  4:53 AM  Result Value Ref Range   Sodium 138 135 - 145 mmol/L   Potassium 4.0 3.5 - 5.1 mmol/L   Chloride 100 98 - 111 mmol/L   CO2 29 22 - 32 mmol/L   Glucose, Bld 121 (H) 70 - 99 mg/dL    Comment: Glucose reference range applies only to samples taken after fasting for at least 8 hours.   BUN 30 (H) 8 - 23 mg/dL   Creatinine, Ser 1.83 (H) 0.61 - 1.24 mg/dL   Calcium 8.3 (L) 8.9 - 10.3 mg/dL   GFR calc non Af Amer 36 (L) >60 mL/min   GFR calc Af Amer 41 (L) >60 mL/min   Anion gap 9 5 - 15    Comment: Performed at [facility], [address] [place], [place] [address]  CBC     Status: Abnormal   Collection Time: 05/10/20  4:53 AM  Result Value Ref Range   WBC 6.5 4.0 - 10.5 K/uL   RBC 3.65 (L) 4.22 - 5.81 MIL/uL   Hemoglobin 10.2 (L) 13.0 - 17.0 g/dL   HCT 30.5 (L) 39 - 52 %   MCV 83.6 80.0 - 100.0 fL   MCH 27.9 26.0 - 34.0 pg   MCHC 33.4 30.0 -  36.0 g/dL   RDW 16.9 (H) 11.5 - 15.5 %   Platelets 101 (L) 150 - 400 K/uL    Comment: Immature Platelet Fraction may be clinically indicated, consider ordering this additional test [id number]    nRBC 0.0 0.0 - 0.2 %    Comment: Performed at [facility], [address] [place], [place] [address]  Glucose, capillary     Status: Abnormal   Collection Time: 05/10/20  7:26 AM  Result Value Ref Range   Glucose-Capillary 111 (H) 70 - 99 mg/dL    Comment: Glucose reference range applies only to samples taken after fasting for at least 8 hours.   No results found.  Blood pressure (!) 126/56,  pulse 69, temperature 98.3 F (36.8 C), temperature source Oral, resp. rate 16, height 5\' 9"  (1.753 m), weight 100.5 kg, SpO2 92 %.   Assessment 1. Diabetes type 2 polyneuropathy 2. Onychomycosis, onycholysis 3. PVD  Plan -Patient seen and examined. -Nails x10 debrided with sterile nail nipper without incident.  Patient tolerated procedure well. -No signs of infection present today.  Apply triple antibiotic and Band-Aid to the right hallux medial nail border daily until discharge or until healed. -Instructed patient on daily foot inspections, use of appropriate fitting shoes at all times, use of lotions on feet daily, and daily foot inspections.  Also discussed appropriate glycemic control. -Podiatry team to sign off.  Discharge instructions placed in chart for follow-up.  Caroline More D.P.M. 05/10/2020, 2:05 PM

## 2020-05-10 NOTE — Progress Notes (Signed)
PROGRESS NOTE    Patrick [name]  NWG:[id number] DOB: Nov 30, 1945 DOA: 05/05/2020 PCP: Idelle Crouch, MD    Chief Complaint  Patient presents with  . Anemia    Brief Narrative:   75 year old gentleman prior history of hypertension, hyperlipidemia, paroxysmal atrial fibrillation, diabetes, GERD, thrombocytopenia, liver cirrhosis, splenomegaly diffuse, coronary artery disease presents to ED with shortness of breath for 2 weeks with a hemoglobin of 6.  He was initially admitted for evaluation of GI bleed GI was consulted.  Patient underwent EGD on 05/08/2020 was overall unremarkable.  He also underwent 2 units of PRBC transfusion and repeat hemoglobin stable.  His hospital course was complicated by acute respiratory failure with hypoxia probably secondary to acute on chronic diastolic heart failure.  He was appropriately diuresed and cardiology consulted. He was cleared by cardiology for discharge.  Patient seen and examined today he had right toenail complaint and was bleeding.  Podiatry consulted for toenail clipping and further evaluation.  Assessment & Plan:   Principal Problem:   GIB (gastrointestinal bleeding) Active Problems:   Absolute anemia   Arterial vascular disease   Diabetes (HCC)   Acid reflux   Fecal occult blood test positive   HLD (hyperlipidemia)   Arthritis, degenerative   Acute anemia of blood loss probably secondary to GI bleed suspect AVMs. Hemoglobin around 6 on admission underwent 2 units of PRBC transfusion currently stable around 10. GI consulted underwent EGD which was unremarkable.  GI recommends outpatient EGD,/ colonoscopy/ capsule endoscopy.   Acute respiratory failure with hypoxia probably secondary to acute on chronic systolic and diastolic heart failure. Echocardiogram on 05/07/2020 showed left ventricular ejection percent with mildly depressed LV systolic function, LV global hypokinesis with grade 2 diastolic dysfunction. Patient was  appropriately diuresed and transition to oral Lasix today. Continue with strict intake and output and daily weights and monitor renal parameters while on Lasix. Cardiology consulted plan for discharge on oral Lasix.   Paroxysmal atrial fibrillation Rate control with beta-blocker. Not on anticoagulation due to GI bleed.    Iron deficiency anemia Iron supplementation on discharge.-Outpatient follow-up with GI.   History of liver cirrhosis Meld score of 21, estimated 19.6% 48-month mortality.   AKI Probably secondary to acute GI bleed Creatinine currently at 1.8.  Continue to monitor.   DVT prophylaxis: SCDs Code Status: Full code Family Communication: (None at bedside Disposition:   Status is: Inpatient  Remains inpatient appropriate because:Ongoing diagnostic testing needed not appropriate for outpatient work up and Inpatient level of care appropriate due to severity of illness   Dispo: The patient is from: Home              Anticipated d/c is to: Home              Anticipated d/c date is: 1 day              Patient currently is not medically stable to d/c.       Consultants:   Cardiology  Podiatry.    Procedures: none.    Antimicrobials: none.    Subjective: Breathing has improved. Still on 1 lit of [place] oxygen.   Objective: Vitals:   05/10/20 0402 05/10/20 0500 05/10/20 0743 05/10/20 1112  BP:   (!) 125/58 (!) 126/56  Pulse: (!) 57  (!) 47 69  Resp:   16 16  Temp:   98.3 F (36.8 C) 98.3 F (36.8 C)  TempSrc:    Oral  SpO2: 98%  97% 92%  Weight:  100.5 kg    Height:        Intake/Output Summary (Last 24 hours) at 05/10/2020 1537 Last data filed at 05/10/2020 1356 Gross per 24 hour  Intake 120 ml  Output 1875 ml  Net -1755 ml   Filed Weights   05/08/20 1038 05/09/20 0500 05/10/20 0500  Weight: 106.6 kg 101.8 kg 100.5 kg    Examination:  General exam: Appears calm and comfortable  Respiratory system: Clear to auscultation. Respiratory  effort normal. Cardiovascular system: S1 & S2 heard, RRR. Improving pedal edema.  Gastrointestinal system: Abdomen is nondistended, soft and nontender.Normal bowel sounds heard. Central nervous system: Alert and oriented. No focal neurological deficits. Extremities: band aid to right hallux medial nail border.  Skin: see above.  Psychiatry:  Mood & affect appropriate.     Data Reviewed: I have personally reviewed following labs and imaging studies  CBC: Recent Labs  Lab 05/05/20 1405 05/05/20 1405 05/06/20 0525 05/06/20 0900 05/06/20 1743 05/07/20 0717 05/08/20 0500 05/09/20 0453 05/10/20 0453  WBC 4.7   < > 4.4  --   --  5.0 5.0 5.7 6.5  NEUTROABS 3.3  --   --   --   --   --   --   --   --   HGB 6.3*   < > 7.1*   < > 8.3* 8.6* 9.0* 9.3* 10.2*  HCT 20.2*   < > 22.9*   < > 26.3* 26.9* 26.6* 29.3* 30.5*  MCV 83.5   < > 84.5  --   --  85.1 83.1 85.7 83.6  PLT 101*   < > 90*  --   --  88* 85* 95* 101*   < > = values in this interval not displayed.    Basic Metabolic Panel: Recent Labs  Lab 05/05/20 1405 05/05/20 1849 05/06/20 0525 05/07/20 0717 05/08/20 0500 05/09/20 0453 05/10/20 0453  NA   < >  --  138 137 139 139 138  K   < >  --  5.0 4.4 4.2 4.0 4.0  CL   < >  --  108 106 107 103 100  CO2   < >  --  22 23 26 29 29   GLUCOSE   < >  --  113* 112* 109* 128* 121*  BUN   < >  --  33* 33* 35* 31* 30*  CREATININE   < >  --  1.81* 2.18* 2.10* 1.79* 1.83*  CALCIUM   < > 8.7* 8.4* 8.2* 8.0* 8.4* 8.3*  MG  --  1.9  --  1.7 1.7  --   --   PHOS  --  4.1  --   --   --   --   --    < > = values in this interval not displayed.    GFR: Estimated Creatinine Clearance: 41.4 mL/min (A) (by C-G formula based on SCr of 1.83 mg/dL (H)).  Liver Function Tests: Recent Labs  Lab 05/05/20 1405 05/08/20 0500  AST 22 19  ALT 11 10  ALKPHOS 63 55  BILITOT 1.4* 1.5*  PROT 7.4 6.3*  ALBUMIN 3.1* 2.8*    CBG: Recent Labs  Lab 05/09/20 0751 05/09/20 1132 05/09/20 1609  05/09/20 2115 05/10/20 0726  GLUCAP 139* 131* 151* 126* 111*     Recent Results (from the past 240 hour(s))  SARS Coronavirus 2 by RT PCR (hospital order, performed in [facility] hospital lab) Nasopharyngeal Nasopharyngeal Swab  Status: None   Collection Time: 05/05/20  4:23 PM   Specimen: Nasopharyngeal Swab  Result Value Ref Range Status   SARS Coronavirus 2 NEGATIVE NEGATIVE Final    Comment: (NOTE) SARS-CoV-2 target nucleic acids are NOT DETECTED.  The SARS-CoV-2 RNA is generally detectable in upper and lower respiratory specimens during the acute phase of infection. The lowest concentration of SARS-CoV-2 viral copies this assay can detect is 250 copies / mL. A negative result does not preclude SARS-CoV-2 infection and should not be used as the sole basis for treatment or other patient management decisions.  A negative result may occur with improper specimen collection / handling, submission of specimen other than nasopharyngeal swab, presence of viral mutation(s) within the areas targeted by this assay, and inadequate number of viral copies (<250 copies / mL). A negative result must be combined with clinical observations, patient history, and epidemiological information.  Fact Sheet for Patients:   StrictlyIdeas.no  Fact Sheet for Healthcare Providers: BankingDealers.co.za  This test is not yet approved or  cleared by the [place] FDA and has been authorized for detection and/or diagnosis of SARS-CoV-2 by FDA under an Emergency Use Authorization (EUA).  This EUA will remain in effect (meaning this test can be used) for the duration of the COVID-19 declaration under Section 564(b)(1) of the Act, 21 U.S.C. section 360bbb-3(b)(1), unless the authorization is terminated or revoked sooner.  Performed at [facility], [address] [place], [place] [address]   MRSA PCR Screening     Status: None    Collection Time: 05/05/20  6:30 PM   Specimen: Nasal Mucosa; Nasopharyngeal  Result Value Ref Range Status   MRSA by PCR NEGATIVE NEGATIVE Final    Comment:        The GeneXpert MRSA Assay (FDA approved for NASAL specimens only), is one component of a comprehensive MRSA colonization surveillance program. It is not intended to diagnose MRSA infection nor to guide or monitor treatment for MRSA infections. Performed at [facility], [address] [place], [place] [address]          Radiology Studies: No results found.      Scheduled Meds: . sodium chloride   Intravenous Once  . sodium chloride   Intravenous Once  . atenolol  25 mg Oral BID  . Chlorhexidine Gluconate Cloth  6 each Topical Daily  . docusate sodium  100 mg Oral BID  . furosemide  40 mg Oral BID  . insulin aspart  0-9 Units Subcutaneous TID WC  . magnesium hydroxide  30 mL Oral Once  . melatonin  2.5 mg Oral QHS  . multivitamin with minerals  1 tablet Oral Daily  . pantoprazole  40 mg Oral Daily  . simvastatin  40 mg Oral Daily  . sodium chloride flush  3 mL Intravenous Q12H   Continuous Infusions: . sodium chloride       LOS: 5 days       Hosie Poisson, MD [facility]   To contact the attending provider between 7A-7P or the covering provider during after hours 7P-7A, please log into the web site [email or web address] and access using universal [place] password for that web site. If you do not have the password, please call the hospital operator.  05/10/2020, 3:37 PM

## 2020-05-10 NOTE — TOC Progression Note (Signed)
Transition of Care [facility]) - Progression Note    Patient Details  Name: Patrick [name] MRN: [id number] Date of Birth: [birth year]  Transition [facility]) CM/SW Contact  Shelbie Hutching, RN Phone Number: 05/10/2020, 3:50 PM  Clinical Narrative:    Patient agrees to getting home health services.  [place] with [facility] has accepted referral for RN and PT.  Potential discharge for tomorrow.    Expected Discharge Plan: [place] Barriers to Discharge: Continued Medical Work up  Expected Discharge Plan and Services Expected Discharge Plan: [place]   Discharge Planning Services: CM Consult Post Acute Care Choice: [place] arrangements for the past 2 months: Single Family Home                           HH Arranged: RN, PT [facility] Agency: [facility] Date [place]: 05/10/20 Time [place]: 3009 Representative spoke with at [place]: [place] ([place]) Interventions    Readmission Risk Interventions No flowsheet data found.

## 2020-05-11 DIAGNOSIS — D696 Thrombocytopenia, unspecified: Secondary | ICD-10-CM

## 2020-05-11 DIAGNOSIS — D649 Anemia, unspecified: Secondary | ICD-10-CM

## 2020-05-11 DIAGNOSIS — K7460 Unspecified cirrhosis of liver: Secondary | ICD-10-CM

## 2020-05-11 LAB — BASIC METABOLIC PANEL
Anion gap: 11 (ref 5–15)
BUN: 27 mg/dL — ABNORMAL HIGH (ref 8–23)
CO2: 30 mmol/L (ref 22–32)
Calcium: 8.6 mg/dL — ABNORMAL LOW (ref 8.9–10.3)
Chloride: 96 mmol/L — ABNORMAL LOW (ref 98–111)
Creatinine, Ser: 1.57 mg/dL — ABNORMAL HIGH (ref 0.61–1.24)
GFR calc Af Amer: 50 mL/min — ABNORMAL LOW (ref >60)
GFR calc non Af Amer: 43 mL/min — ABNORMAL LOW (ref >60)
Glucose, Bld: 106 mg/dL — ABNORMAL HIGH (ref 70–99)
Potassium: 3.9 mmol/L (ref 3.5–5.1)
Sodium: 137 mmol/L (ref 135–145)

## 2020-05-11 LAB — CBC
HCT: 32.7 % — ABNORMAL LOW (ref 39.0–52.0)
Hemoglobin: 10.4 g/dL — ABNORMAL LOW (ref 13.0–17.0)
MCH: 27.4 pg (ref 26.0–34.0)
MCHC: 31.8 g/dL (ref 30.0–36.0)
MCV: 86.3 fL (ref 80.0–100.0)
Platelets: 103 10*3/uL — ABNORMAL LOW (ref 150–400)
RBC: 3.79 MIL/uL — ABNORMAL LOW (ref 4.22–5.81)
RDW: 17.2 % — ABNORMAL HIGH (ref 11.5–15.5)
WBC: 6.7 10*3/uL (ref 4.0–10.5)
nRBC: 0 % (ref 0.0–0.2)

## 2020-05-11 LAB — MAGNESIUM: Magnesium: 1.6 mg/dL — ABNORMAL LOW (ref 1.7–2.4)

## 2020-05-11 LAB — VITAMIN C: Vitamin C: 0.5 mg/dL (ref 0.4–2.0)

## 2020-05-11 LAB — GLUCOSE, CAPILLARY
Glucose-Capillary: 78 mg/dL (ref 70–99)
Glucose-Capillary: 135 mg/dL — ABNORMAL HIGH (ref 70–99)

## 2020-05-11 MED ORDER — MAGNESIUM OXIDE 400 (241.3 MG) MG PO TABS
400.0000 mg | ORAL_TABLET | Freq: Every day | ORAL | Status: DC
  Administered 2020-05-11: 400 mg via ORAL
  Filled 2020-05-11: qty 1

## 2020-05-11 MED ORDER — PANTOPRAZOLE SODIUM 40 MG PO TBEC: 40.0000 mg | DELAYED_RELEASE_TABLET | Freq: Every day | ORAL | 1 refills | Status: AC

## 2020-05-11 MED ORDER — FUROSEMIDE 40 MG PO TABS: 40.0000 mg | ORAL_TABLET | Freq: Two times a day (BID) | ORAL | 1 refills | Status: AC

## 2020-05-11 MED ORDER — MELATONIN 5 MG PO TABS: 2.5000 mg | ORAL_TABLET | Freq: Every day | ORAL | 0 refills | Status: AC

## 2020-05-11 MED ORDER — DOCUSATE SODIUM 100 MG PO CAPS: 100.0000 mg | ORAL_CAPSULE | Freq: Two times a day (BID) | ORAL | 0 refills | Status: AC

## 2020-05-11 MED ORDER — MAGNESIUM OXIDE 400 (241.3 MG) MG PO TABS: 400.0000 mg | ORAL_TABLET | Freq: Every day | ORAL | 0 refills | Status: DC

## 2020-05-11 MED ORDER — ATENOLOL 25 MG PO TABS: 25.0000 mg | ORAL_TABLET | Freq: Two times a day (BID) | ORAL | 1 refills | Status: DC

## 2020-05-11 MED ORDER — SENNOSIDES-DOCUSATE SODIUM 8.6-50 MG PO TABS: 1.0000 | ORAL_TABLET | Freq: Every evening | ORAL | 0 refills | Status: AC | PRN

## 2020-05-11 NOTE — TOC Transition Note (Signed)
Transition of Care [facility]) - CM/SW Discharge Note   Patient Details  Name: Patrick [name] MRN: [id number] Date of Birth: 1945-12-16  [facility]) CM/SW Contact:  Shelbie Hutching, RN Phone Number: 05/11/2020, 9:51 AM   Clinical Narrative:    Patient is discharging home with home health services through [facility].  Jana Half with [facility] is aware of discharge.  Family will transport patient.    Final next level of care: [place] Barriers to Discharge: Barriers Resolved   Patient Goals and CMS Choice Patient states their goals for this hospitalization and ongoing recovery are:: Wants to be able to breath without having to sit and rest CMS Medicare.gov Compare Post Acute Care list provided to:: Patient Choice offered to / list presented to : Patient  Discharge Placement                       Discharge Plan and Services   Discharge Planning Services: CM Consult Post Acute Care Choice: Home Health                    HH Arranged: PT, RN [facility] Agency: [facility] Date [facility] Agency Contacted: 05/11/20 Time [place]: [phone] Representative spoke with at [place]: [place] Determinants of Health ([place]) Interventions     Readmission Risk Interventions No flowsheet data found.

## 2020-05-11 NOTE — Discharge Summary (Signed)
Physician Discharge Summary  Patrick [name] ZOX:[id number] DOB: [birth year] DOA: 05/05/2020  PCP: Idelle Crouch, MD  Admit date: 05/05/2020 Discharge date: 05/11/2020  Admitted From:  Disposition: Home.   Recommendations for Outpatient Follow-up:  1. Follow up with PCP in 1-2 weeks 2. Please obtain BMP/CBC in one week 3.         Please follow up with cardiology as recommended.  4.         Please follow up with Gastroenterology for colonoscopy and capsule endoscopy.   Home Health:yes    Discharge Condition: stable.  CODE STATUS: FULL CODE.  Diet recommendation: Heart Healthy    Brief/Interim Summary: 74 year old gentleman prior history of hypertension, hyperlipidemia, paroxysmal atrial fibrillation, diabetes, GERD, thrombocytopenia, liver cirrhosis, splenomegaly diffuse, coronary artery disease presents to ED with shortness of breath for 2 weeks with a hemoglobin of 6.  He was initially admitted for evaluation of GI bleed GI was consulted.  Patient underwent EGD on 05/08/2020 was overall unremarkable.  He also underwent 2 units of PRBC transfusion and repeat hemoglobin stable.  His hospital course was complicated by acute respiratory failure with hypoxia probably secondary to acute on chronic diastolic heart failure.  He was appropriately diuresed and cardiology consulted. He was cleared by cardiology for discharge.  .  Podiatry consulted for toenail clipping and further evaluation.  Discharge Diagnoses:  Principal Problem:   GIB (gastrointestinal bleeding) Active Problems:   Absolute anemia   Arterial vascular disease   Diabetes (HCC)   Acid reflux   Fecal occult blood test positive   HLD (hyperlipidemia)   Arthritis, degenerative  Acute anemia of blood loss probably secondary to GI bleed suspect AVMs. Hemoglobin around 6 on admission underwent 2 units of PRBC transfusion currently stable around 10. GI consulted underwent EGD which was unremarkable.  GI recommends outpatient  EGD,/ colonoscopy/ capsule endoscopy.   Acute respiratory failure with hypoxia probably secondary to acute on chronic systolic and diastolic heart failure. Echocardiogram on 05/07/2020 showed left ventricular ejection percent with mildly depressed LV systolic function, LV global hypokinesis with grade 2 diastolic dysfunction. Patient was appropriately diuresed and transition to oral Lasix . Continue with strict intake and output and daily weights and monitor renal parameters while on Lasix. Cardiology consulted,  Cleared for discharge.  plan for discharge on oral Lasix.   Paroxysmal atrial fibrillation Rate control with beta-blocker. Not on anticoagulation due to GI bleed.    Iron deficiency anemia Iron supplementation on discharge.-Outpatient follow-up with GI.   History of liver cirrhosis Meld score of 21, estimated 19.6% 53-month mortality.   AKI Probably secondary to acute GI bleed Creatinine currently at 1.5.  Continue to monitor.      Discharge Instructions  Discharge Instructions    Diet - low sodium heart healthy   Complete by: As directed    Discharge instructions   Complete by: As directed    Please follow up with cardiology as recommended.     Allergies as of 05/11/2020      Reactions   Aspirin Other (See Comments)   Dextrose Other (See Comments)   PATIENT CAN NOT TAKE BLOOD THINNERS OF ANY TYPE       Medication List    STOP taking these medications   Janumet 50-1000 MG tablet Generic drug: sitaGLIPtin-metformin   nabumetone 500 MG tablet Commonly known as: RELAFEN   Qualaquin 324 MG capsule Generic drug: quiNINE   spironolactone 100 MG tablet Commonly known as: ALDACTONE   torsemide 20 MG  tablet Commonly known as: DEMADEX     TAKE these medications   acetaminophen 500 MG tablet Commonly known as: TYLENOL Take 500 mg by mouth every 8 (eight) hours as needed.   atenolol 25 MG tablet Commonly known as: TENORMIN Take 1  tablet (25 mg total) by mouth 2 (two) times daily.   cyanocobalamin 1000 MCG tablet Take 1,000 mcg by mouth daily at 6 (six) AM.   docusate sodium 100 MG capsule Commonly known as: COLACE Take 1 capsule (100 mg total) by mouth 2 (two) times daily.   folic acid 1 MG tablet Commonly known as: FOLVITE TAKE 1 TABLET BY MOUTH ONCE DAILY   furosemide 40 MG tablet Commonly known as: LASIX Take 1 tablet (40 mg total) by mouth 2 (two) times daily. What changed: when to take this   magnesium oxide 400 (241.3 Mg) MG tablet Commonly known as: MAG-OX Take 1 tablet (400 mg total) by mouth daily.   melatonin 5 MG Tabs Take 0.5 tablets (2.5 mg total) by mouth at bedtime.   multivitamin with minerals Tabs tablet Take 1 tablet by mouth daily.   pantoprazole 40 MG tablet Commonly known as: PROTONIX Take 1 tablet (40 mg total) by mouth daily.   potassium chloride SA 20 MEQ tablet Commonly known as: KLOR-CON Take 1 tablet (20 mEq total) by mouth daily.   protein supplement shake Liqd Commonly known as: PREMIER PROTEIN Take 325 mLs (11 oz total) by mouth 2 (two) times daily between meals.   senna-docusate 8.6-50 MG tablet Commonly known as: Senokot-S Take 1 tablet by mouth at bedtime as needed for mild constipation.   simvastatin 40 MG tablet Commonly known as: ZOCOR Take 40 mg by mouth daily.   Symbicort 160-4.5 MCG/ACT inhaler Generic drug: budesonide-formoterol Inhale 2 puffs into the lungs 2 (two) times daily.   tamsulosin 0.4 MG Caps capsule Commonly known as: FLOMAX Take 0.4 mg by mouth daily at 6 (six) AM.   thiamine 100 MG tablet Take 1 tablet (100 mg total) by mouth daily.       Follow-up Information    Caroline More, DPM. Schedule an appointment as soon as possible for a visit in 3 month(s).   Specialty: Podiatry Why: Diabetic foot care Contact information: [address] [place] [address] [phone]              Allergies  Allergen  Reactions  . Aspirin Other (See Comments)  . Dextrose Other (See Comments)    PATIENT CAN NOT TAKE BLOOD THINNERS OF ANY TYPE     Consultations:  Podiatry  GI  cardiology   Procedures/Studies: DG Chest Portable 1 View  Result Date: 05/05/2020 CLINICAL DATA:  Shortness of breath EXAM: PORTABLE CHEST 1 VIEW COMPARISON:  10/29/2018, 06/09/2018 CT 03/10/2019 FINDINGS: Cardiomegaly with vascular congestion and diffuse interstitial opacities suspicious for pulmonary edema. Small bilateral pleural effusions with possible loculation on the left. Volume loss left thorax. Hazy opacity at the left lung base. Aortic atherosclerosis. Chronic left-sided rib fractures. IMPRESSION: 1. Cardiomegaly with vascular congestion and diffuse interstitial opacities suspicious for pulmonary edema. Small bilateral pleural effusions with possible loculation on the left. 2. Hazy opacity at the left base, may be due to combination of pleural effusion and consolidation/possible round atelectasis noted on prior CT. Chest CT follow-up previously recommended. Electronically Signed   By: Donavan Foil M.D.   On: 05/05/2020 16:44   ECHOCARDIOGRAM COMPLETE  Result Date: 05/06/2020    ECHOCARDIOGRAM REPORT   Patient Name:  Raechel Ache Date of Exam: 05/06/2020 Medical Rec #:  [id number]           Height:       69.0 in Accession #:    [id number]          Weight:       235.2 lb Date of Birth:  02/13/1946            BSA:          2.213 m Patient Age:    49 years            BP:           119/44 mmHg Patient Gender: M                   HR:           50 bpm. Exam Location:  [facility] Procedure: 2D Echo, Cardiac Doppler and Color Doppler Indications:     CHF-Acute Diastolic 300.76 / A26.33  History:         Patient has no prior history of Echocardiogram examinations.                  Arrythmias:Atrial Fibrillation; Risk Factors:Hypertension.  Sonographer:     Alyse [place] Referring Phys:  [place] Diagnosing Phys: Neoma Laming MD IMPRESSIONS  1. Left ventricular ejection fraction, by estimation, is 40 to 45%. The left ventricle has mildly decreased function. The left ventricle demonstrates global hypokinesis. The left ventricular internal cavity size was severely dilated. There is mild concentric left ventricular hypertrophy. Left ventricular diastolic parameters are consistent with Grade II diastolic dysfunction (pseudonormalization).  2. Right ventricular systolic function is moderately reduced. The right ventricular size is moderately enlarged. There is moderately elevated pulmonary artery systolic pressure.  3. Left atrial size was severely dilated.  4. Right atrial size was severely dilated.  5. The mitral valve is degenerative. Mild mitral valve regurgitation.  6. The tricuspid valve is myxomatous.  7. The aortic valve is abnormal. Aortic valve regurgitation is mild. Mild to moderate aortic valve sclerosis/calcification is present, without any evidence of aortic stenosis. FINDINGS  Left Ventricle: Left ventricular ejection fraction, by estimation, is 40 to 45%. The left ventricle has mildly decreased function. The left ventricle demonstrates global hypokinesis. The left ventricular internal cavity size was severely dilated. There is mild concentric left ventricular hypertrophy. Left ventricular diastolic parameters are consistent with Grade II diastolic dysfunction (pseudonormalization). Right Ventricle: The right ventricular size is moderately enlarged. No increase in right ventricular wall thickness. Right ventricular systolic function is moderately reduced. There is moderately elevated pulmonary artery systolic pressure. The tricuspid  regurgitant velocity is 3.26 m/s, and with an assumed right atrial pressure of 10 mmHg, the estimated right ventricular systolic pressure is 35.4 mmHg. Left Atrium: Left atrial size was severely dilated. Right Atrium: Right atrial size was severely dilated. Pericardium: Trivial pericardial  effusion is present. Mitral Valve: The mitral valve is degenerative in appearance. Moderate to severe mitral annular calcification. Mild mitral valve regurgitation. Tricuspid Valve: The tricuspid valve is myxomatous. Tricuspid valve regurgitation is mild. Aortic Valve: The aortic valve is abnormal. Aortic valve regurgitation is mild. Mild to moderate aortic valve sclerosis/calcification is present, without any evidence of aortic stenosis. Aortic valve mean gradient measures 8.0 mmHg. Aortic valve peak gradient measures 15.2 mmHg. Aortic valve area, by VTI measures 1.35 cm. Pulmonic Valve: The pulmonic valve was normal in structure. Pulmonic valve regurgitation is mild. Aorta: The aortic  root was not well visualized. IAS/Shunts: The interatrial septum was not well visualized.  LEFT VENTRICLE PLAX 2D LVIDd:         5.08 cm  Diastology LVIDs:         3.79 cm  LV e' lateral:   11.40 cm/s LV PW:         1.15 cm  LV E/e' lateral: 11.6 LV IVS:        1.18 cm  LV e' medial:    8.16 cm/s LVOT diam:     1.70 cm  LV E/e' medial:  16.2 LV SV:         58 LV SV Index:   26 LVOT Area:     2.27 cm  RIGHT VENTRICLE RV Mid diam:    4.35 cm RV S prime:     9.57 cm/s TAPSE (M-mode): 1.5 cm LEFT ATRIUM              Index       RIGHT ATRIUM           Index LA diam:        4.90 cm  2.21 cm/m  RA Area:     24.70 cm LA Vol (A2C):   113.0 ml 51.05 ml/m RA Volume:   78.60 ml  35.51 ml/m LA Vol (A4C):   141.0 ml 63.70 ml/m LA Biplane Vol: 135.0 ml 60.99 ml/m  AORTIC VALVE                    PULMONIC VALVE AV Area (Vmax):    1.23 cm     PV Vmax:        1.09 m/s AV Area (Vmean):   1.20 cm     PV Peak grad:   4.8 mmHg AV Area (VTI):     1.35 cm     RVOT Peak grad: 1 mmHg AV Vmax:           195.00 cm/s AV Vmean:          131.000 cm/s AV VTI:            0.427 m AV Peak Grad:      15.2 mmHg AV Mean Grad:      8.0 mmHg LVOT Vmax:         106.00 cm/s LVOT Vmean:        69.000 cm/s LVOT VTI:          0.254 m LVOT/AV VTI ratio: 0.59  AORTA Ao  Root diam: 2.30 cm MITRAL VALVE                TRICUSPID VALVE MV Area (PHT): 4.96 cm     TR Peak grad:   42.5 mmHg MV Decel Time: 153 msec     TR Vmax:        326.00 cm/s MV E velocity: 132.00 cm/s MV A velocity: 45.10 cm/s   SHUNTS MV E/A ratio:  2.93         Systemic VTI:  0.25 m                             Systemic Diam: 1.70 cm Neoma Laming MD Electronically signed by Neoma Laming MD Signature Date/Time: 05/06/2020/4:40:30 PM    Final    US LIVER DOPPLER  Result Date: 05/06/2020 CLINICAL DATA:  74 year old male with elevated LFTs, possible cirrhosis EXAM: DUPLEX ULTRASOUND OF LIVER TECHNIQUE: Color and duplex Doppler  ultrasound was performed to evaluate the hepatic in-flow and out-flow vessels. COMPARISON:  Right upper quadrant ultrasound performed concurrently FINDINGS: Liver: Please see separately dictated right upper quadrant ultrasound. Main Portal Vein size: 1.4 cm Portal Vein Velocities Main Prox:  65 cm/sec with normal hepatopetal directional flow. Main Mid: 57 cm/sec with normal hepatopetal directional flow. Main Dist:  47 cm/sec with normal hepatopetal directional flow. Right: 17 cm/sec with normal hepatopetal directional flow. Left: 28 cm/sec with normal hepatopetal directional flow. Hepatic Vein Velocities Right:  29 cm/sec Middle:  24 cm/sec Left:  22 cm/sec IVC: Present and patent with normal respiratory phasicity. Hepatic Artery Velocity:  28 cm/sec Splenic Vein Velocity:  65 cm/sec Spleen: 15.8 cm x 14.4 cm x 5.2 cm with a total volume of 617 cm^3 (411 cm^3 is upper limit normal) Portal Vein Occlusion/Thrombus: No Splenic Vein Occlusion/Thrombus: No Ascites: Perihepatic ascites is present. Varices: None IMPRESSION: 1. Hepatic cirrhosis with portal hypertension as evidenced by splenomegaly and mild ascites. 2. The portal and hepatic veins are patent with normal directional flow. Electronically Signed   By: Jacqulynn Cadet M.D.   On: 05/06/2020 14:30   US Abdomen Limited RUQ  Result Date:  05/06/2020 CLINICAL DATA:  Abnormal LFTs EXAM: ULTRASOUND ABDOMEN LIMITED RIGHT UPPER QUADRANT COMPARISON:  Concurrently obtained hepatic Doppler ultrasound. FINDINGS: Gallbladder: No gallstones or wall thickening visualized. No sonographic Murphy sign noted by sonographer. Common bile duct: Diameter: Normal at 3 mm Liver: Nodular hepatic contour. The hepatic parenchyma is diffusely heterogeneous and coarsened in appearance. No discrete nodule or mass is identified. Portal vein is patent on color Doppler imaging with normal direction of blood flow towards the liver. Other: Trace perihepatic ascites. Partial imaging of the right kidney demonstrates increased echogenicity of the renal parenchyma. IMPRESSION: 1. Hepatic cirrhosis with trace perihepatic ascites. 2. No evidence of cholelithiasis or biliary ductal dilatation. 3. Incidental note is made of increased echogenicity of the renal parenchyma. Does the patient have underlying medical renal disease? Electronically Signed   By: Jacqulynn Cadet M.D.   On: 05/06/2020 14:28       Subjective: No new complaints.   Discharge Exam: Vitals:   05/11/20 0400 05/11/20 0740  BP: (!) 114/54 (!) 112/54  Pulse: (!) 55 (!) 52  Resp: 18 16  Temp: 98 F (36.7 C) 98.2 F (36.8 C)  SpO2: 92% 92%   Vitals:   05/10/20 2328 05/11/20 0400 05/11/20 0500 05/11/20 0740  BP: (!) 116/52 (!) 114/54  (!) 112/54  Pulse: 62 (!) 55  (!) 52  Resp: 20 18  16   Temp: 98 F (36.7 C) 98 F (36.7 C)  98.2 F (36.8 C)  TempSrc: Oral Oral  Oral  SpO2: 91% 92%  92%  Weight:   99.5 kg   Height:        General: Pt is alert, awake, not in acute distress Cardiovascular: RRR, S1/S2 +, no rubs, no gallops Respiratory: CTA bilaterally, no wheezing, no rhonchi Abdominal: Soft, NT, ND, bowel sounds + Extremities: no edema, no cyanosis    The results of significant diagnostics from this hospitalization (including imaging, microbiology, ancillary and laboratory) are listed  below for reference.     Microbiology: Recent Results (from the past 240 hour(s))  SARS Coronavirus 2 by RT PCR (hospital order, performed in [facility] hospital lab) Nasopharyngeal Nasopharyngeal Swab     Status: None   Collection Time: 05/05/20  4:23 PM   Specimen: Nasopharyngeal Swab  Result Value Ref Range Status  SARS Coronavirus 2 NEGATIVE NEGATIVE Final    Comment: (NOTE) SARS-CoV-2 target nucleic acids are NOT DETECTED.  The SARS-CoV-2 RNA is generally detectable in upper and lower respiratory specimens during the acute phase of infection. The lowest concentration of SARS-CoV-2 viral copies this assay can detect is 250 copies / mL. A negative result does not preclude SARS-CoV-2 infection and should not be used as the sole basis for treatment or other patient management decisions.  A negative result may occur with improper specimen collection / handling, submission of specimen other than nasopharyngeal swab, presence of viral mutation(s) within the areas targeted by this assay, and inadequate number of viral copies (<250 copies / mL). A negative result must be combined with clinical observations, patient history, and epidemiological information.  Fact Sheet for Patients:   StrictlyIdeas.no  Fact Sheet for Healthcare Providers: BankingDealers.co.za  This test is not yet approved or  cleared by the [place] FDA and has been authorized for detection and/or diagnosis of SARS-CoV-2 by FDA under an Emergency Use Authorization (EUA).  This EUA will remain in effect (meaning this test can be used) for the duration of the COVID-19 declaration under Section 564(b)(1) of the Act, 21 U.S.C. section 360bbb-3(b)(1), unless the authorization is terminated or revoked sooner.  Performed at [facility], [address] [place], [place] [address]   MRSA PCR Screening     Status: None   Collection Time: 05/05/20  6:30 PM    Specimen: Nasal Mucosa; Nasopharyngeal  Result Value Ref Range Status   MRSA by PCR NEGATIVE NEGATIVE Final    Comment:        The GeneXpert MRSA Assay (FDA approved for NASAL specimens only), is one component of a comprehensive MRSA colonization surveillance program. It is not intended to diagnose MRSA infection nor to guide or monitor treatment for MRSA infections. Performed at [facility], [address] [place], [place] [address]      Labs: BNP (last 3 results) Recent Labs    05/05/20 1705  BNP 416.3*   Basic Metabolic Panel: Recent Labs  Lab 05/05/20 1849 05/06/20 0525 05/07/20 0717 05/08/20 0500 05/09/20 0453 05/10/20 0453 05/11/20 0313  NA  --    < > 137 139 139 138 137  K  --    < > 4.4 4.2 4.0 4.0 3.9  CL  --    < > 106 107 103 100 96*  CO2  --    < > 23 26 29 29 30   GLUCOSE  --    < > 112* 109* 128* 121* 106*  BUN  --    < > 33* 35* 31* 30* 27*  CREATININE  --    < > 2.18* 2.10* 1.79* 1.83* 1.57*  CALCIUM 8.7*   < > 8.2* 8.0* 8.4* 8.3* 8.6*  MG 1.9  --  1.7 1.7  --   --  1.6*  PHOS 4.1  --   --   --   --   --   --    < > = values in this interval not displayed.   Liver Function Tests: Recent Labs  Lab 05/05/20 1405 05/08/20 0500  AST 22 19  ALT 11 10  ALKPHOS 63 55  BILITOT 1.4* 1.5*  PROT 7.4 6.3*  ALBUMIN 3.1* 2.8*   No results for input(s): LIPASE, AMYLASE in the last 168 hours. No results for input(s): AMMONIA in the last 168 hours. CBC: Recent Labs  Lab 05/05/20 1405 05/06/20 0525 05/07/20  1062 05/08/20 0500 05/09/20 0453 05/10/20 0453 05/11/20 0313  WBC 4.7   < > 5.0 5.0 5.7 6.5 6.7  NEUTROABS 3.3  --   --   --   --   --   --   HGB 6.3*   < > 8.6* 9.0* 9.3* 10.2* 10.4*  HCT 20.2*   < > 26.9* 26.6* 29.3* 30.5* 32.7*  MCV 83.5   < > 85.1 83.1 85.7 83.6 86.3  PLT 101*   < > 88* 85* 95* 101* 103*   < > = values in this interval not displayed.   Cardiac Enzymes: No results for input(s): CKTOTAL, CKMB, CKMBINDEX,  TROPONINI in the last 168 hours. BNP: Invalid input(s): POCBNP CBG: Recent Labs  Lab 05/09/20 2115 05/10/20 0726 05/10/20 1549 05/10/20 2132 05/11/20 0740  GLUCAP 126* 111* 110* 116* 78   D-Dimer No results for input(s): DDIMER in the last 72 hours. Hgb A1c No results for input(s): HGBA1C in the last 72 hours. Lipid Profile No results for input(s): CHOL, HDL, LDLCALC, TRIG, CHOLHDL, LDLDIRECT in the last 72 hours. Thyroid function studies No results for input(s): TSH, T4TOTAL, T3FREE, THYROIDAB in the last 72 hours.  Invalid input(s): FREET3 Anemia work up No results for input(s): VITAMINB12, FOLATE, FERRITIN, TIBC, IRON, RETICCTPCT in the last 72 hours. Urinalysis No results found for: COLORURINE, APPEARANCEUR, [place], [place], [place], [place], [place], [place], PROTEINUR, UROBILINOGEN, NITRITE, LEUKOCYTESUR Sepsis Labs Invalid input(s): PROCALCITONIN,  WBC,  LACTICIDVEN Microbiology Recent Results (from the past 240 hour(s))  SARS Coronavirus 2 by RT PCR (hospital order, performed in [facility] hospital lab) Nasopharyngeal Nasopharyngeal Swab     Status: None   Collection Time: 05/05/20  4:23 PM   Specimen: Nasopharyngeal Swab  Result Value Ref Range Status   SARS Coronavirus 2 NEGATIVE NEGATIVE Final    Comment: (NOTE) SARS-CoV-2 target nucleic acids are NOT DETECTED.  The SARS-CoV-2 RNA is generally detectable in upper and lower respiratory specimens during the acute phase of infection. The lowest concentration of SARS-CoV-2 viral copies this assay can detect is 250 copies / mL. A negative result does not preclude SARS-CoV-2 infection and should not be used as the sole basis for treatment or other patient management decisions.  A negative result may occur with improper specimen collection / handling, submission of specimen other than nasopharyngeal swab, presence of viral mutation(s) within the areas targeted by this assay, and inadequate number of viral  copies (<250 copies / mL). A negative result must be combined with clinical observations, patient history, and epidemiological information.  Fact Sheet for Patients:   StrictlyIdeas.no  Fact Sheet for Healthcare Providers: BankingDealers.co.za  This test is not yet approved or  cleared by the [place] FDA and has been authorized for detection and/or diagnosis of SARS-CoV-2 by FDA under an Emergency Use Authorization (EUA).  This EUA will remain in effect (meaning this test can be used) for the duration of the COVID-19 declaration under Section 564(b)(1) of the Act, 21 U.S.C. section 360bbb-3(b)(1), unless the authorization is terminated or revoked sooner.  Performed at [facility], [address] [place], [place] [address]   MRSA PCR Screening     Status: None   Collection Time: 05/05/20  6:30 PM   Specimen: Nasal Mucosa; Nasopharyngeal  Result Value Ref Range Status   MRSA by PCR NEGATIVE NEGATIVE Final    Comment:        The GeneXpert MRSA Assay (FDA approved for NASAL specimens only), is one component of a  comprehensive MRSA colonization surveillance program. It is not intended to diagnose MRSA infection nor to guide or monitor treatment for MRSA infections. Performed at [facility], [address] [place], [place] [address]      Time coordinating discharge: 36 minutes  SIGNED:   Hosie Poisson, MD  [facility] 05/11/2020, 9:22 AM

## 2020-05-11 NOTE — Care Management Important Message (Signed)
Important Message  Patient Details  Name: Patrick [name] MRN: [id number] Date of Birth: Feb 01, 1946   Medicare Important Message Given:  Yes     [name] 05/11/2020, 11:03 AM

## 2020-06-19 ENCOUNTER — Other Ambulatory Visit: Payer: Self-pay

## 2020-06-19 ENCOUNTER — Encounter: Payer: Self-pay | Admitting: Emergency Medicine

## 2020-06-19 ENCOUNTER — Emergency Department: Payer: Medicare Other

## 2020-06-19 DIAGNOSIS — I130 Hypertensive heart and chronic kidney disease with heart failure and stage 1 through stage 4 chronic kidney disease, or unspecified chronic kidney disease: Principal | ICD-10-CM | POA: Diagnosis present

## 2020-06-19 DIAGNOSIS — Z87891 Personal history of nicotine dependence: Secondary | ICD-10-CM

## 2020-06-19 DIAGNOSIS — M1990 Unspecified osteoarthritis, unspecified site: Secondary | ICD-10-CM | POA: Diagnosis present

## 2020-06-19 DIAGNOSIS — N1832 Chronic kidney disease, stage 3b: Secondary | ICD-10-CM | POA: Diagnosis present

## 2020-06-19 DIAGNOSIS — K219 Gastro-esophageal reflux disease without esophagitis: Secondary | ICD-10-CM | POA: Diagnosis present

## 2020-06-19 DIAGNOSIS — I252 Old myocardial infarction: Secondary | ICD-10-CM

## 2020-06-19 DIAGNOSIS — D696 Thrombocytopenia, unspecified: Secondary | ICD-10-CM | POA: Diagnosis present

## 2020-06-19 DIAGNOSIS — E785 Hyperlipidemia, unspecified: Secondary | ICD-10-CM | POA: Diagnosis present

## 2020-06-19 DIAGNOSIS — E1122 Type 2 diabetes mellitus with diabetic chronic kidney disease: Secondary | ICD-10-CM | POA: Diagnosis present

## 2020-06-19 DIAGNOSIS — I2510 Atherosclerotic heart disease of native coronary artery without angina pectoris: Secondary | ICD-10-CM | POA: Diagnosis present

## 2020-06-19 DIAGNOSIS — I5043 Acute on chronic combined systolic (congestive) and diastolic (congestive) heart failure: Secondary | ICD-10-CM | POA: Diagnosis present

## 2020-06-19 DIAGNOSIS — D509 Iron deficiency anemia, unspecified: Secondary | ICD-10-CM | POA: Diagnosis present

## 2020-06-19 DIAGNOSIS — I272 Pulmonary hypertension, unspecified: Secondary | ICD-10-CM | POA: Diagnosis present

## 2020-06-19 DIAGNOSIS — I480 Paroxysmal atrial fibrillation: Secondary | ICD-10-CM | POA: Diagnosis present

## 2020-06-19 DIAGNOSIS — I083 Combined rheumatic disorders of mitral, aortic and tricuspid valves: Secondary | ICD-10-CM | POA: Diagnosis present

## 2020-06-19 DIAGNOSIS — R0602 Shortness of breath: Secondary | ICD-10-CM | POA: Diagnosis not present

## 2020-06-19 DIAGNOSIS — R0989 Other specified symptoms and signs involving the circulatory and respiratory systems: Secondary | ICD-10-CM | POA: Diagnosis present

## 2020-06-19 DIAGNOSIS — Z20822 Contact with and (suspected) exposure to covid-19: Secondary | ICD-10-CM | POA: Diagnosis present

## 2020-06-19 LAB — BASIC METABOLIC PANEL
Anion gap: 9 (ref 5–15)
BUN: 37 mg/dL — ABNORMAL HIGH (ref 8–23)
CO2: 22 mmol/L (ref 22–32)
Calcium: 8.7 mg/dL — ABNORMAL LOW (ref 8.9–10.3)
Chloride: 107 mmol/L (ref 98–111)
Creatinine, Ser: 1.6 mg/dL — ABNORMAL HIGH (ref 0.61–1.24)
GFR calc Af Amer: 48 mL/min — ABNORMAL LOW (ref >60)
GFR calc non Af Amer: 42 mL/min — ABNORMAL LOW (ref >60)
Glucose, Bld: 104 mg/dL — ABNORMAL HIGH (ref 70–99)
Potassium: 4.3 mmol/L (ref 3.5–5.1)
Sodium: 138 mmol/L (ref 135–145)

## 2020-06-19 LAB — TROPONIN I (HIGH SENSITIVITY): Troponin I (High Sensitivity): 22 ng/L — ABNORMAL HIGH (ref <18)

## 2020-06-19 LAB — CBC
HCT: 25.9 % — ABNORMAL LOW (ref 39.0–52.0)
Hemoglobin: 8.2 g/dL — ABNORMAL LOW (ref 13.0–17.0)
MCH: 28.9 pg (ref 26.0–34.0)
MCHC: 31.7 g/dL (ref 30.0–36.0)
MCV: 91.2 fL (ref 80.0–100.0)
Platelets: 76 10*3/uL — ABNORMAL LOW (ref 150–400)
RBC: 2.84 MIL/uL — ABNORMAL LOW (ref 4.22–5.81)
RDW: 19.1 % — ABNORMAL HIGH (ref 11.5–15.5)
WBC: 5.3 10*3/uL (ref 4.0–10.5)
nRBC: 0 % (ref 0.0–0.2)

## 2020-06-19 LAB — BRAIN NATRIURETIC PEPTIDE: B Natriuretic Peptide: 767.2 pg/mL — ABNORMAL HIGH (ref 0.0–100.0)

## 2020-06-19 NOTE — ED Triage Notes (Addendum)
Patient presents to the ED with bilateral leg swelling and shortness of breath.  Patient states, "I came over here about a month ago to get it straightened out and they did everything but that."  Patient denies a known history of heart failure.  Patient states, "I can't walk a few feet without getting short of breath."

## 2020-06-20 ENCOUNTER — Other Ambulatory Visit: Payer: Self-pay

## 2020-06-20 ENCOUNTER — Inpatient Hospital Stay
Admission: EM | Admit: 2020-06-20 | Discharge: 2020-06-27 | DRG: 291 | Disposition: A | Discharge status: Home Health | Payer: Medicare Other | Attending: Family Medicine | Admitting: Family Medicine

## 2020-06-20 DIAGNOSIS — Z8719 Personal history of other diseases of the digestive system: Secondary | ICD-10-CM

## 2020-06-20 DIAGNOSIS — I272 Pulmonary hypertension, unspecified: Secondary | ICD-10-CM | POA: Diagnosis present

## 2020-06-20 DIAGNOSIS — N1831 Chronic kidney disease, stage 3a: Secondary | ICD-10-CM

## 2020-06-20 DIAGNOSIS — E1122 Type 2 diabetes mellitus with diabetic chronic kidney disease: Secondary | ICD-10-CM | POA: Diagnosis present

## 2020-06-20 DIAGNOSIS — I252 Old myocardial infarction: Secondary | ICD-10-CM | POA: Diagnosis not present

## 2020-06-20 DIAGNOSIS — D509 Iron deficiency anemia, unspecified: Secondary | ICD-10-CM | POA: Diagnosis present

## 2020-06-20 DIAGNOSIS — R0602 Shortness of breath: Secondary | ICD-10-CM | POA: Diagnosis present

## 2020-06-20 DIAGNOSIS — I083 Combined rheumatic disorders of mitral, aortic and tricuspid valves: Secondary | ICD-10-CM | POA: Diagnosis present

## 2020-06-20 DIAGNOSIS — N1832 Chronic kidney disease, stage 3b: Secondary | ICD-10-CM | POA: Diagnosis present

## 2020-06-20 DIAGNOSIS — D696 Thrombocytopenia, unspecified: Secondary | ICD-10-CM | POA: Diagnosis present

## 2020-06-20 DIAGNOSIS — I5043 Acute on chronic combined systolic (congestive) and diastolic (congestive) heart failure: Secondary | ICD-10-CM

## 2020-06-20 DIAGNOSIS — I10 Essential (primary) hypertension: Secondary | ICD-10-CM | POA: Diagnosis present

## 2020-06-20 DIAGNOSIS — I350 Nonrheumatic aortic (valve) stenosis: Secondary | ICD-10-CM

## 2020-06-20 DIAGNOSIS — Z20822 Contact with and (suspected) exposure to covid-19: Secondary | ICD-10-CM | POA: Diagnosis present

## 2020-06-20 DIAGNOSIS — I5033 Acute on chronic diastolic (congestive) heart failure: Secondary | ICD-10-CM | POA: Diagnosis present

## 2020-06-20 DIAGNOSIS — I2510 Atherosclerotic heart disease of native coronary artery without angina pectoris: Secondary | ICD-10-CM | POA: Diagnosis present

## 2020-06-20 DIAGNOSIS — I509 Heart failure, unspecified: Secondary | ICD-10-CM

## 2020-06-20 DIAGNOSIS — D500 Iron deficiency anemia secondary to blood loss (chronic): Secondary | ICD-10-CM

## 2020-06-20 DIAGNOSIS — Z87891 Personal history of nicotine dependence: Secondary | ICD-10-CM | POA: Diagnosis not present

## 2020-06-20 DIAGNOSIS — E119 Type 2 diabetes mellitus without complications: Secondary | ICD-10-CM

## 2020-06-20 DIAGNOSIS — M1990 Unspecified osteoarthritis, unspecified site: Secondary | ICD-10-CM | POA: Diagnosis present

## 2020-06-20 DIAGNOSIS — K219 Gastro-esophageal reflux disease without esophagitis: Secondary | ICD-10-CM | POA: Diagnosis present

## 2020-06-20 DIAGNOSIS — I130 Hypertensive heart and chronic kidney disease with heart failure and stage 1 through stage 4 chronic kidney disease, or unspecified chronic kidney disease: Secondary | ICD-10-CM | POA: Diagnosis present

## 2020-06-20 DIAGNOSIS — I482 Chronic atrial fibrillation, unspecified: Secondary | ICD-10-CM | POA: Diagnosis present

## 2020-06-20 DIAGNOSIS — R0989 Other specified symptoms and signs involving the circulatory and respiratory systems: Secondary | ICD-10-CM | POA: Diagnosis present

## 2020-06-20 DIAGNOSIS — I480 Paroxysmal atrial fibrillation: Secondary | ICD-10-CM | POA: Diagnosis present

## 2020-06-20 DIAGNOSIS — N170 Acute kidney failure with tubular necrosis: Secondary | ICD-10-CM

## 2020-06-20 DIAGNOSIS — E785 Hyperlipidemia, unspecified: Secondary | ICD-10-CM | POA: Diagnosis present

## 2020-06-20 LAB — GLUCOSE, CAPILLARY
Glucose-Capillary: 93 mg/dL (ref 70–99)
Glucose-Capillary: 128 mg/dL — ABNORMAL HIGH (ref 70–99)
Glucose-Capillary: 137 mg/dL — ABNORMAL HIGH (ref 70–99)

## 2020-06-20 MED ORDER — SPIRONOLACTONE 25 MG PO TABS
50.0000 mg | ORAL_TABLET | Freq: Every day | ORAL | Status: DC
  Administered 2020-06-20 – 2020-06-27 (×8): 50 mg via ORAL
  Filled 2020-06-20 (×8): qty 2

## 2020-06-20 MED ORDER — PROMETHAZINE HCL 25 MG/ML IJ SOLN: 12.5000 mg | Freq: Four times a day (QID) | INTRAMUSCULAR | Status: DC | PRN

## 2020-06-20 MED ORDER — ONDANSETRON HCL 4 MG/2ML IJ SOLN: 4.0000 mg | Freq: Four times a day (QID) | INTRAMUSCULAR | Status: DC | PRN

## 2020-06-20 MED ORDER — INSULIN ASPART 100 UNIT/ML ~~LOC~~ SOLN
0.0000 [IU] | Freq: Three times a day (TID) | SUBCUTANEOUS | Status: DC
  Administered 2020-06-20 – 2020-06-24 (×3): 2 [IU] via SUBCUTANEOUS
  Filled 2020-06-20 (×3): qty 1

## 2020-06-20 MED ORDER — OXYCODONE-ACETAMINOPHEN 7.5-325 MG PO TABS
1.0000 | ORAL_TABLET | Freq: Once | ORAL | Status: AC
  Administered 2020-06-21: 1 via ORAL
  Filled 2020-06-20: qty 1

## 2020-06-20 MED ORDER — LINAGLIPTIN 5 MG PO TABS
5.0000 mg | ORAL_TABLET | Freq: Every day | ORAL | Status: DC
  Administered 2020-06-21 – 2020-06-27 (×7): 5 mg via ORAL
  Filled 2020-06-20 (×8): qty 1

## 2020-06-20 MED ORDER — ATENOLOL 25 MG PO TABS: 25.0000 mg | ORAL_TABLET | Freq: Two times a day (BID) | ORAL | Status: DC

## 2020-06-20 MED ORDER — SODIUM CHLORIDE 0.9% FLUSH
3.0000 mL | Freq: Two times a day (BID) | INTRAVENOUS | Status: DC
  Administered 2020-06-21 – 2020-06-27 (×12): 3 mL via INTRAVENOUS

## 2020-06-20 MED ORDER — SODIUM CHLORIDE 0.9% FLUSH
3.0000 mL | INTRAVENOUS | Status: DC | PRN
  Administered 2020-06-27: 3 mL via INTRAVENOUS

## 2020-06-20 MED ORDER — POTASSIUM CHLORIDE CRYS ER 10 MEQ PO TBCR
10.0000 meq | EXTENDED_RELEASE_TABLET | Freq: Two times a day (BID) | ORAL | Status: DC
  Administered 2020-06-20 – 2020-06-22 (×6): 10 meq via ORAL
  Filled 2020-06-20 (×8): qty 1

## 2020-06-20 MED ORDER — INSULIN ASPART 100 UNIT/ML ~~LOC~~ SOLN: 0.0000 [IU] | Freq: Every day | SUBCUTANEOUS | Status: DC

## 2020-06-20 MED ORDER — FLUTICASONE FUROATE-VILANTEROL 200-25 MCG/INH IN AEPB
1.0000 | INHALATION_SPRAY | Freq: Every day | RESPIRATORY_TRACT | Status: DC
  Administered 2020-06-21 – 2020-06-27 (×7): 1 via RESPIRATORY_TRACT
  Filled 2020-06-20: qty 28

## 2020-06-20 MED ORDER — LISINOPRIL 5 MG PO TABS
5.0000 mg | ORAL_TABLET | Freq: Every day | ORAL | Status: DC
  Administered 2020-06-21: 5 mg via ORAL
  Filled 2020-06-20 (×2): qty 1

## 2020-06-20 MED ORDER — ATENOLOL 25 MG PO TABS
25.0000 mg | ORAL_TABLET | Freq: Every day | ORAL | Status: DC
  Administered 2020-06-20 – 2020-06-21 (×2): 25 mg via ORAL
  Filled 2020-06-20 (×3): qty 1

## 2020-06-20 MED ORDER — MELATONIN 5 MG PO TABS
2.5000 mg | ORAL_TABLET | Freq: Every day | ORAL | Status: DC
  Administered 2020-06-21 – 2020-06-25 (×5): 2.5 mg via ORAL
  Filled 2020-06-20: qty 0.5
  Filled 2020-06-20 (×3): qty 1
  Filled 2020-06-20: qty 0.5
  Filled 2020-06-20 (×4): qty 1

## 2020-06-20 MED ORDER — FUROSEMIDE 10 MG/ML IJ SOLN
40.0000 mg | Freq: Two times a day (BID) | INTRAMUSCULAR | Status: DC
  Administered 2020-06-20 – 2020-06-22 (×3): 40 mg via INTRAVENOUS
  Filled 2020-06-20 (×3): qty 4

## 2020-06-20 MED ORDER — SODIUM CHLORIDE 0.9 % IV SOLN: 250.0000 mL | INTRAVENOUS | Status: DC | PRN

## 2020-06-20 MED ORDER — ACETAMINOPHEN 325 MG PO TABS
650.0000 mg | ORAL_TABLET | ORAL | Status: DC | PRN
  Administered 2020-06-21: 650 mg via ORAL
  Filled 2020-06-20: qty 2

## 2020-06-20 MED ORDER — FUROSEMIDE 10 MG/ML IJ SOLN
40.0000 mg | Freq: Once | INTRAMUSCULAR | Status: AC
  Administered 2020-06-20: 40 mg via INTRAVENOUS
  Filled 2020-06-20: qty 4

## 2020-06-20 MED ORDER — OXYCODONE-ACETAMINOPHEN 5-325 MG PO TABS
1.0000 | ORAL_TABLET | Freq: Once | ORAL | Status: AC
  Administered 2020-06-20: 1 via ORAL
  Filled 2020-06-20: qty 1

## 2020-06-20 NOTE — ED Notes (Signed)
Resumed care from [place] rn.  Pt alert. Family with pt.  Pt on 2 liters oxygen.   No chest pain   Pt has sob.

## 2020-06-20 NOTE — H&P (Signed)
History and Physical    [name] BHA:[id number] DOB: Dec 20, 1945 DOA: 06/20/2020  PCP: Idelle Crouch, MD   Patient coming from: Home  I have personally briefly reviewed patient's old medical records in [place]  Chief Complaint: Shortness of breath, swelling  HPI: Patrick [name] is a 74 y.o. male with medical history significant for DM, HTN, A. 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  He denies chest pain, denies cough fever or chills he denies blood in the stool or melanotic stool and denies abdominal pain vomiting or diarrhea. ED Course: On arrival vitals were for the most part within normal limits.  Blood work significant for hemoglobin of 8.2, down from 10.4 during hospitalization a month prior.  Creatinine 1.6 which is his baseline.  Troponin 22, BNP 767.  EKG pending.  Chest x-ray showed pulmonary vascular congestion.  He was started on Lasix.  Hospitalist consulted for admission.  Review of Systems: As per HPI otherwise all other systems on review of systems negative.    Past Medical History:  Diagnosis Date  . ASCVD (arteriosclerotic cardiovascular disease)   . Chronic pulmonary hypertension ([place])   . Diabetes mellitus without complication ([place])   . GERD (gastroesophageal reflux disease)   . Heart attack ([place]) 1989  . Heart disease   . Hyperlipidemia   . Hypertension   . IDA (iron deficiency anemia)   . Moderate mitral insufficiency   . Moderate tricuspid insufficiency   . OA (osteoarthritis)   . Paroxysmal A-fib ([place])   . Thrombocytosis ([place])    . Tremor     Past Surgical History:  Procedure Laterality Date  . [place]   with stainless steel stent  . CATARACT EXTRACTION    . Closed Reduction Vertebral Process Fracture  1966  . COLONOSCOPY  10/13/2014  . ESOPHAGOGASTRODUODENOSCOPY (EGD) WITH PROPOFOL N/A 05/08/2020   Procedure: ESOPHAGOGASTRODUODENOSCOPY (EGD) WITH PROPOFOL;  Surgeon: [name], MD;  Location: [facility] ENDOSCOPY;  Service: Gastroenterology;  Laterality: N/A;  . HEMORRHOIDECTOMY WITH HEMORRHOID BANDING    . HERNIA REPAIR     x 2  . KNEE SURGERY     bilateral knee surgery     reports that he quit smoking about 23 years ago. His smoking use included cigarettes. He has a 20.00 pack-year smoking history. He has never used smokeless tobacco. He reports previous alcohol use of about 50.0 standard drinks of alcohol per week. He reports previous drug use.  Allergies  Allergen Reactions  . Aspirin Other (See Comments)  . Dextrose Other (See Comments)    PATIENT CAN NOT TAKE BLOOD THINNERS OF ANY TYPE     Family History  Problem Relation Age of Onset  . Heart attack Father       Prior to Admission medications   Medication Sig Start Date End Date Taking? Authorizing Provider  acetaminophen (TYLENOL) 500 MG tablet Take 500 mg by mouth every 8 (eight) hours as needed.    [provider]  atenolol (TENORMIN) 25 MG tablet Take 1 tablet (25 mg total) by mouth 2 (two) times daily. 05/11/20   Hosie Poisson, MD  cyanocobalamin  1000 MCG tablet Take 1,000 mcg by mouth daily at 6 (six) AM.    [provider]  docusate sodium (COLACE) 100 MG capsule Take 1 capsule (100 mg total) by mouth 2 (two) times daily. 05/11/20   Hosie Poisson, MD  folic acid (FOLVITE) 1 MG tablet TAKE 1 TABLET BY MOUTH ONCE DAILY Patient taking differently: Take 1 mg by mouth daily.  11/09/19   Cammie Sickle, MD  furosemide (LASIX) 40 MG tablet Take 1 tablet (40 mg total) by mouth 2 (two) times daily.  05/11/20   Hosie Poisson, MD  magnesium oxide (MAG-OX) 400 (241.3 Mg) MG tablet Take 1 tablet (400 mg total) by mouth daily. 05/11/20   Hosie Poisson, MD  melatonin 5 MG TABS Take 0.5 tablets (2.5 mg total) by mouth at bedtime. 05/11/20   Hosie Poisson, MD  Multiple Vitamin (MULTIVITAMIN WITH MINERALS) TABS tablet Take 1 tablet by mouth daily. 06/12/18   Gouru, Illene Silver, MD  pantoprazole (PROTONIX) 40 MG tablet Take 1 tablet (40 mg total) by mouth daily. 05/11/20   Hosie Poisson, MD  potassium chloride SA (K-DUR,KLOR-CON) 20 MEQ tablet Take 1 tablet (20 mEq total) by mouth daily. 06/11/18   Nicholes Mango, MD  protein supplement shake (PREMIER PROTEIN) LIQD Take 325 mLs (11 oz total) by mouth 2 (two) times daily between meals. Patient not taking: Reported on 04/28/2019 06/12/18   Nicholes Mango, MD  senna-docusate (SENOKOT-S) 8.6-50 MG tablet Take 1 tablet by mouth at bedtime as needed for mild constipation. 05/11/20   Hosie Poisson, MD  simvastatin (ZOCOR) 40 MG tablet Take 40 mg by mouth daily.    [provider]  SYMBICORT 160-4.5 MCG/ACT inhaler Inhale 2 puffs into the lungs 2 (two) times daily. 04/05/20   [provider]  thiamine 100 MG tablet Take 1 tablet (100 mg total) by mouth daily. 06/12/18   Nicholes Mango, MD    Physical Exam: Vitals:   06/19/20 1600 06/19/20 1603 06/19/20 1903 06/19/20 2211  BP: 135/75  (!) 112/56 (!) 135/55  Pulse:   64 87  Resp: 20  17 (!) 24  Temp: 98.2 F (36.8 C)  98.2 F (36.8 C)   TempSrc: Oral  Oral   SpO2: 94%  98% 93%  Weight:  101.2 kg    Height:  5\' 9"  (1.753 m)       Vitals:   06/19/20 1600 06/19/20 1603 06/19/20 1903 06/19/20 2211  BP: 135/75  (!) 112/56 (!) 135/55  Pulse:   64 87  Resp: 20  17 (!) 24  Temp: 98.2 F (36.8 C)  98.2 F (36.8 C)   TempSrc: Oral  Oral   SpO2: 94%  98% 93%  Weight:  101.2 kg    Height:  5\' 9"  (1.753 m)        Constitutional: Aler and oriented x 3 .  Speaking in short sentences, conversational  dyspnea HEENT:      Head: Normocephalic and atraumatic.         Eyes: PERLA, EOMI, Conjunctivae are normal. Sclera is non-icteric.       Mouth/Throat: Mucous membranes are moist.       Neck: Supple with no signs of meningismus. Cardiovascular: Regular rate and rhythm. No murmurs, gallops, or rubs. 2+ symmetrical distal pulses are present . No JVD. No 3+LE edema Respiratory: Respiratory effort increased.Lungs sounds diminished bilaterally with bibasilar crackles gastrointestinal: Soft, non tender, and non distended with positive bowel sounds. No rebound or guarding. Genitourinary: No CVA tenderness.  Musculoskeletal: Nontender with normal range of motion in all extremities. No cyanosis, or erythema of extremities. Neurologic: Normal speech and language. Face is symmetric. Moving all extremities. No gross focal neurologic deficits . Skin: Skin is warm, dry.  No rash or ulcers Psychiatric: Mood and affect are normal Speech and behavior are normal   Labs on Admission: I have personally reviewed following labs and imaging studies  CBC: Recent Labs  Lab 06/19/20 1610  WBC 5.3  HGB 8.2*  HCT 25.9*  MCV 91.2  PLT 76*   Basic Metabolic Panel: Recent Labs  Lab 06/19/20 1610  NA 138  K 4.3  CL 107  CO2 22  GLUCOSE 104*  BUN 37*  CREATININE 1.60*  CALCIUM 8.7*   GFR: Estimated Creatinine Clearance: 47.5 mL/min (A) (by C-G formula based on SCr of 1.6 mg/dL (H)). Liver Function Tests: No results for input(s): AST, ALT, ALKPHOS, BILITOT, PROT, ALBUMIN in the last 168 hours. No results for input(s): LIPASE, AMYLASE in the last 168 hours. No results for input(s): AMMONIA in the last 168 hours. Coagulation Profile: No results for input(s): INR, PROTIME in the last 168 hours. Cardiac Enzymes: No results for input(s): CKTOTAL, CKMB, CKMBINDEX, TROPONINI in the last 168 hours. BNP (last 3 results) No results for input(s): PROBNP in the last 8760 hours. HbA1C: No results for input(s):  HGBA1C in the last 72 hours. CBG: No results for input(s): GLUCAP in the last 168 hours. Lipid Profile: No results for input(s): CHOL, HDL, LDLCALC, TRIG, CHOLHDL, LDLDIRECT in the last 72 hours. Thyroid Function Tests: No results for input(s): TSH, T4TOTAL, FREET4, T3FREE, THYROIDAB in the last 72 hours. Anemia Panel: No results for input(s): VITAMINB12, FOLATE, FERRITIN, TIBC, IRON, RETICCTPCT in the last 72 hours. Urine analysis: No results found for: COLORURINE, APPEARANCEUR, LABSPEC, [place], GLUCOSEU, HGBUR, BILIRUBINUR, KETONESUR, PROTEINUR, UROBILINOGEN, NITRITE, LEUKOCYTESUR  Radiological Exams on Admission: DG Chest 2 View  Result Date: 06/19/2020 CLINICAL DATA:  Shortness of breath.  Leg swelling. EXAM: CHEST - 2 VIEW COMPARISON:  05/05/2020 FINDINGS: Mild wedging of a lower thoracic vertebral body is similar to the prior CT. Reverse apical lordotic positioning on the frontal radiograph. Remote upper left rib fractures. Patient rotated left. Mild cardiomegaly. Trace bilateral pleural fluid. No pneumothorax. Pulmonary venous congestion, as evidenced by pulmonary interstitial prominence and indistinctness. Similar left base pulmonary opacities and volume loss, favoring atelectasis and/or scar. IMPRESSION: Cardiomegaly with pulmonary venous congestion and trace bilateral pleural effusions. Electronically Signed   By: Abigail Miyamoto M.D.   On: 06/19/2020 16:57    EKG: Independently reviewed. Interpretation : A. fib with slow ventricular response  Assessment/Plan 74 year old male with history of DM, HTN, A. 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      Acute on chronic combined systolic and diastolic CHF (congestive heart failure) ([place]) -Patient presenting with progressively worsening lower extremity edema and dyspnea on exertion, BNP above 700 with pulmonary vascular congestion on chest x-ray -IV Lasix 40 mg twice daily -Continue home atenolol.  Not currently on ACE/ARB, likely related to renal failure -Daily weights intake and output monitoring -Last echocardiogram was 05/07/2020 showing EF 40 to 45%  and grade 2 diastolic dysfunction along with moderate aortic stenosis -Can consider cardiology consult    Iron deficiency anemia due to chronic blood loss   History of GI bleed -Hemoglobin 8.2, down from 10.4 a month prior -Serial H&H -Follow stool for occult blood -Hospitalized a month prior with a GI bleed requiring 2 units PRBCs -Please consult GI in the a.m. if downward trend in hemoglobin -We will keep n.p.o. in case procedure warranted    Thrombocytopenia ([facility]) Alcoholic liver cirrhosis -Platelet count 76,000 -No acute concerns    Diabetes ([place]) -Sliding scale insulin pending med rec and resumption of home meds    Chronic kidney disease, stage 3a -Renal function at baseline    Chronic a-fib ([facility]) -Continue atenolol.  Not on anticoagulation due to thrombocytopenia and chronic GI bleed    Benign essential HTN -Continue home meds pending med rec    Moderate aortic stenosis -Caution with diuretic treatment  DVT prophylaxis: SCDs Code Status: full code  Family Communication:  none  Disposition Plan: Back to previous home environment Consults called: none  Status:At the time of admission, it appears that the appropriate admission status for this patient is INPATIENT. This is judged to be reasonable and necessary in order to provide the required intensity of service to ensure the patient's safety given the presenting symptoms, physical exam findings, and  initial radiographic and laboratory data in the context of their  Comorbid conditions.   Patient requires inpatient status due to high intensity of service, high risk for further deterioration and high frequency of surveillance required.   I certify that at the point of admission it is my clinical judgment that the patient will require inpatient hospital care spanning beyond [place] MD [facility]     06/20/2020, 3:45 AM

## 2020-06-20 NOTE — Progress Notes (Signed)
PROGRESS NOTE    Patrick [name]  MRN:[id number] DOB: 09/25/1946 DOA: 06/20/2020 PCP: Sparks, Jeffrey D, MD      Brief Narrative:  Mr. Ferraiolo is a 74 y.o. M with hx DM, HTN, A. 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  In the ER, chest x-ray showed congestion, legs were severely swollen.  He was started on IV diuretics and admitted to the hospitalist service.      Assessment & Plan:   Chronic systolic and diastolic CHF Moderate aortic stenosis Admitted and started on Lasix 40 IV, prompt urine output so far, about 1L.  EF 40-45% last month.  This is new reduced EF last month, evaluated by primary Caridologist at that time, deferred GDMT or ischemic work up.   -Continue Furosemide 40 mg IV twice a day  -K supplement -Strict I/Os, daily weights, telemetry  -Daily monitoring renal function -Start low dose lisinopril for HFrEF and monitor BP and Cr -Continue atenolol    Iron deficiency anemia due to chronic blood loss History of GI bleed with AVM Hgb dropped to 8.3 today.  No clinical bleeding at this point to warrant GI evaluation.  Known AVMs.  Did require 2 units transfusion last hospital stay. -Monitor Hgb -Transfusion threashold 8 g/dL given HF  Thrombocytopenia Alcoholic liver cirrhosis  Diabetes Glucose controlled -Continue SS corrections -COntinue DPPIV -Hold metformin  Chronic kidney disease IIIa Cr is near [place] with eGFR high 40s.  Chronic atrial fibrillation Not anticoagulation candidate due to chronic GI bleeding from AVMs and thrombocytopenia. HR controlled/low. -Continue atenolol  Hypertension -Continue atenolol, spironolactone and start lisinopril   COPD -Continue Symbicort as formulary alternative           Disposition: Status is: Inpatient  Remains inpatient appropriate because:requires ongoing IV LASix for severe dyspnea and swelling from CHF   Dispo: The patient is from: Home              Anticipated d/c is to: Home              Anticipated d/c date is: 3 days              Patient currently is not medically stable to d/c.              MDM: This is a no charge note.  For further details, please see H&P by my partner Dr. Dr. Duncan from earlier today.  The below labs and imaging reports were reviewed and summarized above.    DVT prophylaxis: SCDs Start: 06/20/20 0340  Code Status: FULL Family Communication:     Consultants:     Procedures:     Antimicrobials:      Culture data:              Subjective: Patient still feeling dyspneic at rest.  He has severe swelling of the legs bilaterally.  No confusion.  No fever.        Objective: Vitals:   06/20/20 0730 06/20/20 0800 06/20/20 0830 06/20/20 0900  BP: 119/67 120/63 (!) 118/56 (!) 123/58  Pulse: (!) 111 (!) 54 (!) 58 (!) 46  Resp: 19 (!) 24 (!) 21 (!) 23  Temp:      TempSrc:      SpO2: 100% 99% 100% 100%  Weight:        Height:       No intake or output data in the 24 hours ending 06/20/20 1109 Filed Weights   06/19/20 1603  Weight: 101.2 kg    Examination: The patient was seen and examined.      Data Reviewed: I have personally reviewed following labs and imaging studies:  CBC: Recent Labs  Lab 06/19/20 1610  WBC 5.3  HGB 8.2*  HCT 25.9*  MCV 91.2  PLT 76*   Basic Metabolic Panel: Recent Labs  Lab 06/19/20 1610  NA 138  K 4.3  CL 107  CO2 22  GLUCOSE 104*  BUN 37*  CREATININE 1.60*  CALCIUM 8.7*   GFR: Estimated Creatinine Clearance: 47.5 mL/min (A) (by C-G formula based on SCr of 1.6 mg/dL (H)). Liver Function Tests: No results for input(s): AST, ALT, ALKPHOS, BILITOT, PROT, ALBUMIN in the last 168 hours. No results for input(s): LIPASE,  AMYLASE in the last 168 hours. No results for input(s): AMMONIA in the last 168 hours. Coagulation Profile: No results for input(s): INR, PROTIME in the last 168 hours. Cardiac Enzymes: No results for input(s): CKTOTAL, CKMB, CKMBINDEX, TROPONINI in the last 168 hours. BNP (last 3 results) No results for input(s): PROBNP in the last 8760 hours. HbA1C: No results for input(s): HGBA1C in the last 72 hours. CBG: Recent Labs  Lab 06/20/20 0917  GLUCAP 93   Lipid Profile: No results for input(s): CHOL, HDL, LDLCALC, TRIG, CHOLHDL, LDLDIRECT in the last 72 hours. Thyroid Function Tests: No results for input(s): TSH, T4TOTAL, FREET4, T3FREE, THYROIDAB in the last 72 hours. Anemia Panel: No results for input(s): VITAMINB12, FOLATE, FERRITIN, TIBC, IRON, RETICCTPCT in the last 72 hours. Urine analysis: No results found for: COLORURINE, APPEARANCEUR, LABSPEC, PHURINE, GLUCOSEU, HGBUR, BILIRUBINUR, KETONESUR, PROTEINUR, UROBILINOGEN, NITRITE, LEUKOCYTESUR Sepsis Labs: _0 (procalcitonin:4,lacticacidven:4)  )No results found for this or any previous visit (from the past 240 hour(s)).       Radiology Studies: DG Chest 2 View  Result Date: 06/19/2020 CLINICAL DATA:  Shortness of breath.  Leg swelling. EXAM: CHEST - 2 VIEW COMPARISON:  05/05/2020 FINDINGS: Mild wedging of a lower thoracic vertebral body is similar to the prior CT. Reverse apical lordotic positioning on the frontal radiograph. Remote upper left rib fractures. Patient rotated left. Mild cardiomegaly. Trace bilateral pleural fluid. No pneumothorax. Pulmonary venous congestion, as evidenced by pulmonary interstitial prominence and indistinctness. Similar left base pulmonary opacities and volume loss, favoring atelectasis and/or scar. IMPRESSION: Cardiomegaly with pulmonary venous congestion and trace bilateral pleural effusions. Electronically Signed   By: Abigail Miyamoto M.D.   On: 06/19/2020 16:57        Scheduled  Meds: . atenolol  25 mg Oral Daily  . fluticasone furoate-vilanterol  1 puff Inhalation Daily  . furosemide  40 mg Intravenous Q12H  . insulin aspart  0-15 Units Subcutaneous TID WC  . insulin aspart  0-5 Units Subcutaneous QHS  . linagliptin  5 mg Oral Daily  . lisinopril  5 mg Oral Daily  . melatonin  2.5 mg Oral QHS  . potassium chloride  10 mEq Oral BID  . sodium chloride flush  3 mL Intravenous Q12H  . spironolactone  50 mg Oral Daily   Continuous Infusions: . sodium chloride       LOS: 0 days    Time spent: 15 minutes    Edwin Dada, MD [facility] 06/20/2020, 11:09 AM     Please page though [place] or Epic secure chat:  For password, contact charge  nurse     

## 2020-06-20 NOTE — ED Notes (Signed)
fsbs - 137

## 2020-06-20 NOTE — ED Notes (Signed)
This RN to bedside. Pt stated he accidentally set off call bell.

## 2020-06-20 NOTE — Evaluation (Signed)
Physical Therapy Evaluation Patient Details Name: Patrick [name] MRN: [id number] DOB: 04-10-1946 Today's Date: 06/20/2020   History of Present Illness  Patrick [name] is a 38yoM who comes to [facility] on 8/23 after progressive LEE, SOB, and DOE. Pt admitted in CHF exacerbation, also noted to have progressed anemia compared to 2 months ago whence he received 2u PRBC. PMH: DM, HTN, AF not on anticoag 2/2 GIB hx, CKD3a, combined CHF, AS, cirrhosis. At baseline pt is modified independent with basic ADL and household mobility.  Clinical Impression  Pt admitted with above diagnosis. Pt currently with functional limitations due to the deficits listed below (see "PT Problem List"). Upon entry, pt in bed, awake and conditionally agreeable to participate, but ultimately does not want to 'do all this exercise' right now.  The pt is alert and oriented x4, pleasant, conversational, and generally a tangential historian, difficult to get specifics especially regarding time-frames. Assessment reveals mild strength impairment in ankles, mild paresthesias/anesthesia in feet (pt reports as chronic diabetic PND), pitting edema in legs up to mid thigh. HR is quite variable and irregular, generally brady with occasionally dives into mid to low 40s. Pt has SOB at rest on 2L/min, SpO2 99%. Pt refuses OOB, but denies any acute weakness aside from chronic baseline difficulty. He explains his biggest limitor is DOE and at baseline rarely walks more than 67ft. Functional mobility assessment demonstrates increased effort requirements, poor tolerance, but no need for physical assistance, whereas the patient performed these at a somewhat higher level of independence PTA. Pt generally feels PT to be unhelpful, just finishing up several weeks of HHPT. Pt will benefit from skilled PT intervention to increase independence and safety with basic mobility in preparation for discharge to the venue listed below.       Follow Up  Recommendations No PT follow up (Pt jus tDC from HHPT this week, reports it to be unhelpful, just makes him OOB)    Equipment Recommendations  None recommended by PT    Recommendations for Other Services       Precautions / Restrictions Precautions Precautions: Fall Restrictions Weight Bearing Restrictions: No      Mobility  Bed Mobility Overal bed mobility:  (pt refuses at this time, but reports AMB to toilet with RN earlier, unsupported, confidence in balance)                Transfers                    Ambulation/Gait                Stairs            Wheelchair Mobility    Modified Rankin (Stroke Patients Only)       Balance                                             Pertinent Vitals/Pain Pain Assessment: 0-10 Pain Score: 8  Pain Location: Rt shoulder ("bursitis") Pain Descriptors / Indicators: Aching Pain Intervention(s): Limited activity within patient's tolerance;Monitored during session;Premedicated before session;Repositioned    Home Living Family/patient expects to be discharged to:: Private residence Living Arrangements: Spouse/significant other Available Help at Discharge: Family;Available 24 hours/day;Available PRN/intermittently Type of Home: House Home Access: Stairs to enter Entrance Stairs-Rails: Can reach both Entrance Stairs-Number of Steps: 3 Home Layout: One level Home Equipment: Toilet  riser;Cane - single point;Walker - 2 wheels Additional Comments: Pt reports sponge bathing at sink     Prior Function Level of Independence: Independent         Comments: wife helps c underwear adn shorts, otherwise, pt independent with ADL performance; denies any falls/close cals in last 6 months ; uses a RW at all times.     Hand Dominance   Dominant Hand: Right    Extremity/Trunk Assessment   Upper Extremity Assessment Upper Extremity Assessment: Overall WFL for tasks assessed (quite strong  bilat)    Lower Extremity Assessment Lower Extremity Assessment: Overall WFL for tasks assessed (mildly weak in ankles, ROM WFL; pitting edema bilat from toes to mid thigh, not assessed at higher levels.)       Communication   Communication: HOH  Cognition Arousal/Alertness: Awake/alert Behavior During Therapy: WFL for tasks assessed/performed Overall Cognitive Status: Difficult to assess                                 General Comments: Pt answers questions in a tangential manner often, requires several attempts to obtain simple information or patient report      General Comments      Exercises     Assessment/Plan    PT Assessment Patient needs continued PT services  PT Problem List Decreased activity tolerance;Decreased mobility;Cardiopulmonary status limiting activity       PT Treatment Interventions Functional mobility training;Therapeutic activities;Therapeutic exercise;Patient/family education    PT Goals (Current goals can be found in the Care Plan section)  Acute Rehab PT Goals Patient Stated Goal: get this fluid off'a me PT Goal Formulation: With patient Time For Goal Achievement: 07/04/20 Potential to Achieve Goals: Good    Frequency Min 2X/week   Barriers to discharge        Co-evaluation               AM-PAC PT "6 Clicks" Mobility  Outcome Measure Help needed turning from your back to your side while in a flat bed without using bedrails?: A Little Help needed moving from lying on your back to sitting on the side of a flat bed without using bedrails?: A Little Help needed moving to and from a bed to a chair (including a wheelchair)?: A Little Help needed standing up from a chair using your arms (e.g., wheelchair or bedside chair)?: A Little Help needed to walk in hospital room?: A Little Help needed climbing 3-5 steps with a railing? : A Little 6 Click Score: 18    End of Session Equipment Utilized During Treatment:  Oxygen Activity Tolerance: Patient limited by fatigue;No increased pain Patient left: in bed;with call bell/phone within reach   PT Visit Diagnosis: Muscle weakness (generalized) (M62.81);Difficulty in walking, not elsewhere classified (R26.2)    Time: 1113-1130 PT Time Calculation (min) (ACUTE ONLY): 17 min   Charges:   PT Evaluation $PT Eval Moderate Complexity: 1 Mod          12:35 PM, 06/20/20 Etta Grandchild, PT, DPT Physical Therapist - [facility]  [phone] ([place])    Verlia Kaney C 06/20/2020, 12:30 PM

## 2020-06-20 NOTE — ED Notes (Signed)
meds given. Pt alert  Oxygen in place. Marland Kitchen

## 2020-06-20 NOTE — TOC Initial Note (Signed)
Transition of Care [facility]) - Initial/Assessment Note    Patient Details  Name: Patrick [name] MRN: [id number] Date of Birth: [birth year]  [facility] CM/SW Contact:    Anselm Pancoast, RN Phone Number: 06/20/2020, 2:04 PM  Clinical Narrative:                 Spoke with patient at bedside. Wife provides transportation and assistance with ADL's as needed. Patient is not on O2 at home and states he has a scale at home although he does not weigh often. Discussed importance of monitoring weight with CHF however patient not receptive to education at this time. Patient states he does monitor his blood sugars daily and has had good results recently with some dietary changes. TOC will follow throughout hospitalization.   Expected Discharge Plan: Home/Self Care Barriers to Discharge: Continued Medical Work up   Patient Goals and CMS Choice Patient states their goals for this hospitalization and ongoing recovery are:: get better and get back home      Expected Discharge Plan and Services Expected Discharge Plan: Home/Self Care       Living arrangements for the past 2 months: Single Family Home                                      Prior Living Arrangements/Services Living arrangements for the past 2 months: Single Family Home Lives with:: Spouse Patient language and need for interpreter reviewed:: Yes Do you feel safe going back to the place where you live?: Yes      Need for Family Participation in Patient Care: Yes (Comment) Care giver support system in place?: Yes (comment)   Criminal Activity/Legal Involvement Pertinent to Current Situation/Hospitalization: No - Comment as needed  Activities of Daily Living      Permission Sought/Granted                  Emotional Assessment Appearance:: Appears stated age Attitude/Demeanor/Rapport: Ambitious, Engaged Affect (typically observed): Accepting Orientation: : Oriented to Self, Oriented to Place,  Oriented to  Time, Oriented to Situation Alcohol / Substance Use: Not Applicable Psych Involvement: No (comment)  Admission diagnosis:  Acute CHF (congestive heart failure) (HCC) [I50.9] Acute on chronic diastolic CHF (congestive heart failure) ([place]) [I50.33] Patient Active Problem List   Diagnosis Date Noted  . Acute on chronic combined systolic and diastolic CHF (congestive heart failure) ([place]) 06/20/2020  . Chronic kidney disease, stage 3a 06/20/2020  . Iron deficiency anemia due to chronic blood loss 06/20/2020  . Moderate aortic stenosis 06/20/2020  . History of GI bleed 06/20/2020  . Acute CHF (congestive heart failure) ([place]) 06/20/2020  . Acute on chronic diastolic CHF (congestive heart failure) ([place]) 06/20/2020  . GIB (gastrointestinal bleeding) 05/05/2020  . Chronic a-fib ([place]) 07/14/2019  . Pleural effusion on left 06/08/2018  . Alcoholic cirrhosis of liver with ascites ([place]) 06/08/2018  . Benign essential HTN 03/03/2018  . Thrombocytopenia ([place]) 02/06/2018  . Fecal occult blood test positive 09/08/2014  . Alcohol drinker 09/08/2014  . Absolute anemia 04/15/2014  . 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 Diabetes ([place]) 04/15/2014  . Acid reflux 04/15/2014  . 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 HLD (hyperlipidemia) 04/15/2014  . Arthritis, degenerative 04/15/2014   PCP:  Idelle Crouch, MD Pharmacy:   [place], [place]   [id number] Phone: [phone] Fax: [phone]     Social Determinants of Health (SDOH) Interventions    Readmission Risk Interventions No flowsheet data found.

## 2020-06-20 NOTE — ED Notes (Signed)
meds given. Pt alert 

## 2020-06-20 NOTE — ED Provider Notes (Signed)
[facility] Emergency Department Provider Note  ____________________________________________   First MD Initiated Contact with Patient 06/20/20 0245     (approximate)  I have reviewed the triage vital signs and the nursing notes.   HISTORY  Chief Complaint Shortness of Breath and Leg Swelling    HPI Patrick [name] is a 74 y.o. male with below list of previous medical conditions including MI, combined systolic diastolic heart failure, chronic anemia, AVM of the colon, paroxysmal A. fib presents to the emergency department secondary to progressive dyspnea and bilateral lower extremity edema.  Patient states that he is incapable of walking very short distances without being completely short of breath".  Patient states that the distance is progressively becoming shorter.  Patient denies any chest pain.         Past Medical History:  Diagnosis Date  . ASCVD (arteriosclerotic cardiovascular disease)   . Chronic pulmonary hypertension ([place])   . Diabetes mellitus without complication ([place])   . GERD (gastroesophageal reflux disease)   . Heart attack ([place]) 1989  . Heart disease   . Hyperlipidemia   . Hypertension   . IDA (iron deficiency anemia)   . Moderate mitral insufficiency   . Moderate tricuspid insufficiency   . OA (osteoarthritis)   . Paroxysmal A-fib ([place])   . Thrombocytosis ([place])   . Tremor     Patient Active Problem List   Diagnosis Date Noted  . GIB (gastrointestinal bleeding) 05/05/2020  . Pleural effusion on left 06/08/2018  . Thrombocytopenia ([place]) 02/06/2018  . Fecal occult blood test positive 09/08/2014  . Alcohol drinker 09/08/2014  . Absolute anemia 04/15/2014  . 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 Diabetes ([place]) 04/15/2014  . Acid reflux 04/15/2014  . 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 HLD (hyperlipidemia) 04/15/2014  . Arthritis, degenerative 04/15/2014    Past Surgical History:  Procedure  Laterality Date  . [place]   with stainless steel stent  . CATARACT EXTRACTION    . Closed Reduction Vertebral Process Fracture  1966  . COLONOSCOPY  10/13/2014  . ESOPHAGOGASTRODUODENOSCOPY (EGD) WITH PROPOFOL N/A 05/08/2020   Procedure: ESOPHAGOGASTRODUODENOSCOPY (EGD) WITH PROPOFOL;  Surgeon: [name], MD;  Location: [facility] ENDOSCOPY;  Service: Gastroenterology;  Laterality: N/A;  . HEMORRHOIDECTOMY WITH HEMORRHOID BANDING    . HERNIA REPAIR     x 2  . KNEE SURGERY     bilateral knee surgery    Prior to Admission medications   Medication Sig Start Date End Date Taking? Authorizing Provider  acetaminophen (TYLENOL) 500 MG tablet Take 500 mg by mouth every 8 (eight) hours as needed.    [provider]  atenolol (TENORMIN) 25 MG tablet Take 1 tablet (25 mg total) by mouth 2 (two) times daily. 05/11/20   Hosie Poisson, MD  cyanocobalamin 1000 MCG tablet Take 1,000 mcg by mouth daily at 6 (six) AM.    [provider]  docusate sodium (COLACE) 100 MG capsule Take 1 capsule (100 mg total) by mouth 2 (two) times daily. 05/11/20   Hosie Poisson, MD  folic acid (FOLVITE) 1 MG tablet TAKE 1 TABLET BY MOUTH ONCE DAILY Patient taking differently: Take 1 mg by mouth daily.  11/09/19   Cammie Sickle, MD  furosemide (LASIX) 40 MG tablet Take 1 tablet (40 mg total) by mouth 2 (two) times daily. 05/11/20   Hosie Poisson, MD  magnesium oxide (MAG-OX) 400 (241.3 Mg) MG tablet Take 1 tablet (400 mg total) by  mouth daily. 05/11/20   Hosie Poisson, MD  melatonin 5 MG TABS Take 0.5 tablets (2.5 mg total) by mouth at bedtime. 05/11/20   Hosie Poisson, MD  Multiple Vitamin (MULTIVITAMIN WITH MINERALS) TABS tablet Take 1 tablet by mouth daily. 06/12/18   Gouru, Illene Silver, MD  pantoprazole (PROTONIX) 40 MG tablet Take 1 tablet (40 mg total) by mouth daily. 05/11/20   Hosie Poisson, MD  potassium chloride SA (K-DUR,KLOR-CON) 20 MEQ tablet Take 1 tablet (20 mEq total) by mouth  daily. 06/11/18   Nicholes Mango, MD  protein supplement shake (PREMIER PROTEIN) LIQD Take 325 mLs (11 oz total) by mouth 2 (two) times daily between meals. Patient not taking: Reported on 04/28/2019 06/12/18   Nicholes Mango, MD  senna-docusate (SENOKOT-S) 8.6-50 MG tablet Take 1 tablet by mouth at bedtime as needed for mild constipation. 05/11/20   Hosie Poisson, MD  simvastatin (ZOCOR) 40 MG tablet Take 40 mg by mouth daily.    [provider]  SYMBICORT 160-4.5 MCG/ACT inhaler Inhale 2 puffs into the lungs 2 (two) times daily. 04/05/20   [provider]  thiamine 100 MG tablet Take 1 tablet (100 mg total) by mouth daily. 06/12/18   Nicholes Mango, MD    Allergies Aspirin and Dextrose  Family History  Problem Relation Age of Onset  . Heart attack Father     Social History Social History   Tobacco Use  . Smoking status: Former Smoker    Packs/day: 0.50    Years: 40.00    Pack years: 20.00    Types: Cigarettes    Quit date: 04/18/1997    Years since quitting: 23.1  . Smokeless tobacco: Never Used  Vaping Use  . Vaping Use: Never used  Substance Use Topics  . Alcohol use: Not Currently    Alcohol/week: 50.0 standard drinks    Types: 50 Shots of liquor per week    Comment: "I mix a couple cups whisky/day"  . Drug use: Not Currently    Review of Systems Constitutional: No fever/chills Eyes: No visual changes. ENT: No sore throat. Cardiovascular: Denies chest pain. Respiratory: Positive for shortness of breath. Gastrointestinal: No abdominal pain.  No nausea, no vomiting.  No diarrhea.  No constipation. Genitourinary: Negative for dysuria. Musculoskeletal: Negative for neck pain.  Negative for back pain.  Positive for bilateral lower extremity swelling Integumentary: Negative for rash. Neurological: Negative for headaches, focal weakness or numbness.   ____________________________________________   PHYSICAL EXAM:  VITAL SIGNS: ED Triage Vitals  Enc Vitals  Group     BP 06/19/20 1600 135/75     Pulse Rate 06/19/20 1903 64     Resp 06/19/20 1600 20     Temp 06/19/20 1600 98.2 F (36.8 C)     Temp Source 06/19/20 1600 Oral     SpO2 06/19/20 1600 94 %     Weight 06/19/20 1603 101.2 kg (223 lb)     Height 06/19/20 1603 1.753 m (5\' 9" )     Head Circumference --      Peak Flow --      Pain Score 06/19/20 1603 0     Pain Loc --      Pain Edu? --      Excl. in [place]? --     Constitutional: Alert and oriented.  Apparent dyspnea Eyes: Conjunctivae are pale Head: Atraumatic. Mouth/Throat: Patient is wearing a mask. Neck: No stridor.  No meningeal signs.   Cardiovascular: Normal rate, regular rhythm. Good peripheral circulation. Grossly  normal heart sounds. Respiratory: Normal respiratory effort.  No retractions.  Bibasilar rales Gastrointestinal: Soft and nontender. No distention.  Musculoskeletal: 3+ bilateral lower extremity pitting edema standing to the thigh Neurologic:  Normal speech and language. No gross focal neurologic deficits are appreciated.  Skin:  Skin is warm, dry and intact. Psychiatric: Mood and affect are normal. Speech and behavior are normal.  ____________________________________________   LABS (all labs ordered are listed, but only abnormal results are displayed)  Labs Reviewed  BASIC METABOLIC PANEL - Abnormal; Notable for the following components:      Result Value   Glucose, Bld 104 (*)    BUN 37 (*)    Creatinine, Ser 1.60 (*)    Calcium 8.7 (*)    GFR calc non Af Amer 42 (*)    GFR calc Af Amer 48 (*)    All other components within normal limits  CBC - Abnormal; Notable for the following components:   RBC 2.84 (*)    Hemoglobin 8.2 (*)    HCT 25.9 (*)    RDW 19.1 (*)    Platelets 76 (*)    All other components within normal limits  BRAIN NATRIURETIC PEPTIDE - Abnormal; Notable for the following components:   B Natriuretic Peptide 767.2 (*)    All other components within normal limits  TROPONIN I (HIGH  SENSITIVITY) - Abnormal; Notable for the following components:   Troponin I (High Sensitivity) 22 (*)    All other components within normal limits   ____________________________________________  EKG  ED ECG REPORT I, [place] Shawnelle Spoerl, the attending physician, personally viewed and interpreted this ECG.   Date: 06/20/2020  EKG Time: 3:48 AM  Rate: 57  Rhythm: Atrial fibrillation  Axis: Normal  Intervals: Normal  ST&T Change: None  ____________________________________________  RADIOLOGY I, [place] Katheryne Gorr, personally viewed and evaluated these images (plain radiographs) as part of my medical decision making, as well as reviewing the written report by the radiologist.  ED MD interpretation: Cardiomegaly with pulmonary vascular congestion and bilateral trace pleural effusion  Official radiology report(s): DG Chest 2 View  Result Date: 06/19/2020 CLINICAL DATA:  Shortness of breath.  Leg swelling. EXAM: CHEST - 2 VIEW COMPARISON:  05/05/2020 FINDINGS: Mild wedging of a lower thoracic vertebral body is similar to the prior CT. Reverse apical lordotic positioning on the frontal radiograph. Remote upper left rib fractures. Patient rotated left. Mild cardiomegaly. Trace bilateral pleural fluid. No pneumothorax. Pulmonary venous congestion, as evidenced by pulmonary interstitial prominence and indistinctness. Similar left base pulmonary opacities and volume loss, favoring atelectasis and/or scar. IMPRESSION: Cardiomegaly with pulmonary venous congestion and trace bilateral pleural effusions. Electronically Signed   By: Abigail Miyamoto M.D.   On: 06/19/2020 16:57      Procedures   ____________________________________________   INITIAL IMPRESSION / MDM / ASSESSMENT AND PLAN / ED COURSE  As part of my medical decision making, I reviewed the following data within the [place] NUMBER  74 year old male presented with above-stated history and physical exam consistent with acute  on chronic CHF exacerbation.  Laboratory data revealed a BNP of 767, chest x-ray findings consistent with CHF exacerbation.  In addition patient's hemoglobin 8.2 most recent was 10.4 on May 11, 2020.  Patient given Lasix 40 mg IV.  Patient discussed with Dr. Damita Dunnings for hospital admission for further evaluation and management.  ____________________________________________  FINAL CLINICAL IMPRESSION(S) / ED DIAGNOSES  Final diagnoses:  Acute on chronic combined systolic and diastolic congestive heart  failure ([place])  Anemia due to chronic blood loss     MEDICATIONS GIVEN DURING THIS VISIT:  Medications - No data to display   ED Discharge Orders    None      *Please note:  Theodoro B Kovalenko was evaluated in Emergency Department on 06/20/2020 for the symptoms described in the history of present illness. He was evaluated in the context of the global COVID-19 pandemic, which necessitated consideration that the patient might be at risk for infection with the SARS-CoV-2 virus that causes COVID-19. Institutional protocols and algorithms that pertain to the evaluation of patients at risk for COVID-19 are in a state of rapid change based on information released by regulatory bodies including the CDC and federal and state organizations. These policies and algorithms were followed during the patient's care in the ED.  Some ED evaluations and interventions may be delayed as a result of limited staffing during and after the pandemic.*  Note:  This document was prepared using Dragon voice recognition software and may include unintentional dictation errors.   Gregor Hams, MD 06/20/20 [phone]

## 2020-06-20 NOTE — ED Notes (Addendum)
Report off to mitch rn cpod nurse

## 2020-06-21 ENCOUNTER — Encounter: Payer: Self-pay | Admitting: Family Medicine

## 2020-06-21 LAB — CBC
HCT: 25 % — ABNORMAL LOW (ref 39.0–52.0)
Hemoglobin: 8 g/dL — ABNORMAL LOW (ref 13.0–17.0)
MCH: 29.6 pg (ref 26.0–34.0)
MCHC: 32 g/dL (ref 30.0–36.0)
MCV: 92.6 fL (ref 80.0–100.0)
Platelets: 68 10*3/uL — ABNORMAL LOW (ref 150–400)
RBC: 2.7 MIL/uL — ABNORMAL LOW (ref 4.22–5.81)
RDW: 18.9 % — ABNORMAL HIGH (ref 11.5–15.5)
WBC: 5.3 10*3/uL (ref 4.0–10.5)
nRBC: 0 % (ref 0.0–0.2)

## 2020-06-21 LAB — BASIC METABOLIC PANEL
Anion gap: 8 (ref 5–15)
BUN: 39 mg/dL — ABNORMAL HIGH (ref 8–23)
CO2: 26 mmol/L (ref 22–32)
Calcium: 8.6 mg/dL — ABNORMAL LOW (ref 8.9–10.3)
Chloride: 104 mmol/L (ref 98–111)
Creatinine, Ser: 1.64 mg/dL — ABNORMAL HIGH (ref 0.61–1.24)
GFR calc Af Amer: 47 mL/min — ABNORMAL LOW (ref >60)
GFR calc non Af Amer: 41 mL/min — ABNORMAL LOW (ref >60)
Glucose, Bld: 97 mg/dL (ref 70–99)
Potassium: 4.5 mmol/L (ref 3.5–5.1)
Sodium: 138 mmol/L (ref 135–145)

## 2020-06-21 LAB — GLUCOSE, CAPILLARY
Glucose-Capillary: 108 mg/dL — ABNORMAL HIGH (ref 70–99)
Glucose-Capillary: 127 mg/dL — ABNORMAL HIGH (ref 70–99)
Glucose-Capillary: 87 mg/dL (ref 70–99)
Glucose-Capillary: 93 mg/dL (ref 70–99)
Glucose-Capillary: 96 mg/dL (ref 70–99)
Glucose-Capillary: 99 mg/dL (ref 70–99)

## 2020-06-21 LAB — TROPONIN I (HIGH SENSITIVITY)
Troponin I (High Sensitivity): 26 ng/L — ABNORMAL HIGH (ref <18)
Troponin I (High Sensitivity): 25 ng/L — ABNORMAL HIGH (ref <18)

## 2020-06-21 LAB — SARS CORONAVIRUS 2 BY RT PCR (HOSPITAL ORDER, PERFORMED IN ~~LOC~~ HOSPITAL LAB): SARS Coronavirus 2: NEGATIVE

## 2020-06-21 LAB — HEMOGLOBIN AND HEMATOCRIT, BLOOD
HCT: 25.3 % — ABNORMAL LOW (ref 39.0–52.0)
Hemoglobin: 8 g/dL — ABNORMAL LOW (ref 13.0–17.0)

## 2020-06-21 MED ORDER — OXYCODONE HCL 5 MG PO TABS
5.0000 mg | ORAL_TABLET | Freq: Once | ORAL | Status: AC
  Administered 2020-06-21: 5 mg via ORAL
  Filled 2020-06-21: qty 1

## 2020-06-21 MED ORDER — SODIUM CHLORIDE 0.9 % IV BOLUS
250.0000 mL | Freq: Once | INTRAVENOUS | Status: AC
  Administered 2020-06-21: 250 mL via INTRAVENOUS

## 2020-06-21 NOTE — Consult Note (Addendum)
CARDIOLOGY CONSULT NOTE               Patient ID: Patrick [name] MRN: [id number] DOB/AGE: 1946/08/02 74 y.o.  Admit date: 06/20/2020 Referring Physician Dwyane Dee Primary Physician [facility] Primary Cardiologist Nehemiah Massed Reason for Consultation acute on chronic combined CHF, bradycardia  HPI: 74 year old gentleman referred for evaluation of acute on chronic combined systolic and diastolic CHF with LVEF 16-07% and bradycardia.  The patient has a history of paroxysmal atrial fibrillation, not on chronic anticoagulation with a history of GI bleed and thrombocytopenia, CKD stage III, coronary artery disease status post MI and stents, type 2 diabetes, alcohol abuse, and hyperlipidemia.  The patient was recently admitted July 2021 with GI bleed, received 2 units PRBCs.  The patient presented to [facility] ER 06/20/2020 for a several month history of peripheral edema and progressively worsening exertional shortness of breath. When asked why the patient came to the ER at this time, he reports that the home health aid recommended it. On arrival to the ER, the patient's blood pressure was 135/75, heart rate 64 bpm, RR 20, O2 sat 94% on room air and weight 223 pounds. Chest x-ray revealed cardiomegaly with pulmonary venous congestion and trace bilateral pleural effusions.  Admission labs notable for BUN 37, creatinine 1.60, GFR 48, hemoglobin 8.2 (down from 10.4 on 05/11/2020), hematocrit 25.9, platelets 76, high-sensitivity troponin 22, BNP 767.  ECG revealed atrial fibrillation at a rate of 57 bpm with QTC 521 ms, nonspecific T wave abnormalities and low voltage. His baseline heart rate is in the 50-60s. After receiving oxycodone, the patient became hypotensive and bradycardic with rates in the 40s while sleeping; the patient was asymptomatic and has remained asymptomatic. He currently reports feeling "fine," has diuresed well so far with IV Lasix, and denies chest pain.  2D echocardiogram on 05/06/2020 revealed mild  decrease left ventricular function with LVEF 40 to 45% with severely dilated left ventricle, moderate right ventricular systolic pressure, severe biatrial enlargement, trivial pericardial effusion, mild mitral, aortic, and tricuspid regurgitation, and mild to moderate aortic valve sclerosis/calcification without any evidence of aortic stenosis with aortic valve mean gradient 8.0 mmHg, peak gradient 15.2 mmHg, and valve area 1.35 cm.  This reduction in LV function was felt to be secondary to atrial fibrillation.  Lexiscan Myoview on 03/22/2020 revealed normal left ventricular function with LVEF 62% without evidence of scar or ischemia.  Review of systems complete and found to be negative unless listed above     Past Medical History:  Diagnosis Date  . ASCVD (arteriosclerotic cardiovascular disease)   . Chronic pulmonary hypertension ([place])   . Diabetes mellitus without complication ([place])   . GERD (gastroesophageal reflux disease)   . Heart attack ([place]) 1989  . Heart disease   . Hyperlipidemia   . Hypertension   . IDA (iron deficiency anemia)   . Moderate mitral insufficiency   . Moderate tricuspid insufficiency   . OA (osteoarthritis)   . Paroxysmal A-fib ([place])   . Thrombocytosis ([place])   . Tremor     Past Surgical History:  Procedure Laterality Date  . [place]   with stainless steel stent  . CATARACT EXTRACTION    . Closed Reduction Vertebral Process Fracture  1966  . COLONOSCOPY  10/13/2014  . ESOPHAGOGASTRODUODENOSCOPY (EGD) WITH PROPOFOL N/A 05/08/2020   Procedure: ESOPHAGOGASTRODUODENOSCOPY (EGD) WITH PROPOFOL;  Surgeon: [name], MD;  Location: [facility] ENDOSCOPY;  Service: Gastroenterology;  Laterality: N/A;  . HEMORRHOIDECTOMY WITH HEMORRHOID BANDING    .  HERNIA REPAIR     x 2  . KNEE SURGERY     bilateral knee surgery    (Not in a hospital admission)  Social History   Socioeconomic History  . Marital status: Married    Spouse name: Not on file    . Number of children: Not on file  . Years of education: Not on file  . Highest education level: Not on file  Occupational History  . Not on file  Tobacco Use  . Smoking status: Former Smoker    Packs/day: 0.50    Years: 40.00    Pack years: 20.00    Types: Cigarettes    Quit date: 04/18/1997    Years since quitting: 23.1  . Smokeless tobacco: Never Used  Vaping Use  . Vaping Use: Never used  Substance and Sexual Activity  . Alcohol use: Not Currently    Alcohol/week: 50.0 standard drinks    Types: 50 Shots of liquor per week    Comment: "I mix a couple cups whisky/day"  . Drug use: Not Currently  . Sexual activity: Yes  Other Topics Concern  . Not on file  Social History Narrative  . Not on file   Social Determinants of Health   Financial Resource Strain:   . Difficulty of Paying Living Expenses: Not on file  Food Insecurity:   . Worried About Charity fundraiser in the Last Year: Not on file  . Ran Out of Food in the Last Year: Not on file  Transportation Needs:   . Lack of Transportation (Medical): Not on file  . Lack of Transportation (Non-Medical): Not on file  Physical Activity:   . Days of Exercise per Week: Not on file  . Minutes of Exercise per Session: Not on file  Stress:   . Feeling of Stress : Not on file  Social Connections:   . Frequency of Communication with Friends and Family: Not on file  . Frequency of Social Gatherings with Friends and Family: Not on file  . Attends Religious Services: Not on file  . Active Member of Clubs or Organizations: Not on file  . Attends Archivist Meetings: Not on file  . Marital Status: Not on file  Intimate Partner Violence:   . Fear of Current or Ex-Partner: Not on file  . Emotionally Abused: Not on file  . Physically Abused: Not on file  . Sexually Abused: Not on file    Family History  Problem Relation Age of Onset  . Heart attack Father       Review of systems complete and found to be  negative unless listed above      PHYSICAL EXAM  General: Obese elderly gentleman lying in bed, in no acute distress,  HEENT:  Normocephalic and atramatic Neck:  No JVD.  Lungs: normal effort of breathing on room air, mildly diminished breathsounds, no wheezing, speaking in full sentences Heart: irregularly irregular . 2/6 systolic murmur Abdomen: nontender, distended, soft  Msk:  Gait not assessed, no obvious deformities Extremities: 2+ pitting bilateral lower extremity edema with blisters   Neuro: Alert and oriented X 3. Psych:  Good affect, responds appropriately  Labs:   Lab Results  Component Value Date   WBC 5.3 06/21/2020   HGB 8.0 (L) 06/21/2020   HCT 25.3 (L) 06/21/2020   MCV 92.6 06/21/2020   PLT 68 (L) 06/21/2020    Recent Labs  Lab 06/21/20 0445  NA 138  K 4.5  CL 104  CO2 26  BUN 39*  CREATININE 1.64*  CALCIUM 8.6*  GLUCOSE 97   Lab Results  Component Value Date   TROPONINI 0.03 (HH) 06/08/2018   No results found for: CHOL No results found for: HDL No results found for: LDLCALC No results found for: TRIG No results found for: CHOLHDL No results found for: LDLDIRECT    Radiology: DG Chest 2 View  Result Date: 06/19/2020 CLINICAL DATA:  Shortness of breath.  Leg swelling. EXAM: CHEST - 2 VIEW COMPARISON:  05/05/2020 FINDINGS: Mild wedging of a lower thoracic vertebral body is similar to the prior CT. Reverse apical lordotic positioning on the frontal radiograph. Remote upper left rib fractures. Patient rotated left. Mild cardiomegaly. Trace bilateral pleural fluid. No pneumothorax. Pulmonary venous congestion, as evidenced by pulmonary interstitial prominence and indistinctness. Similar left base pulmonary opacities and volume loss, favoring atelectasis and/or scar. IMPRESSION: Cardiomegaly with pulmonary venous congestion and trace bilateral pleural effusions. Electronically Signed   By: Abigail Miyamoto M.D.   On: 06/19/2020 16:57    EKG: atrial  fibrillation, rate 57 bpm, low voltage, nonspecific T wave abnormalities  ASSESSMENT AND PLAN:  1. Acute on chronic combined systolic and diastolic CHF, presenting with several month history of progressive exertional dyspnea and lower extremity edema with chest x-ray revealing pulmonary venous congestion and trace bilateral pleural effusions with BNP greater than 700.  Initial diuresis and clinical improvement with IV Lasix. 2D echocardiogram on 05/06/2020 revealed mildly decreased left ventricular function with LVEF 40 to 45% with severely dilated left ventricle, moderately elevated pulmonary artery systolic pressure, severe biatrial enlargement, trivial pericardial effusion, mild mitral, aortic, and tricuspid regurgitation, and mild to moderate aortic valve sclerosis/calcification without evidence of aortic stenosis with aortic valve mean gradient 8.0 mmHg, peak gradient 15.2 mmHg, and valve area 1.35 cm.  2. Chronic atrial fibrillation, not on chronic anticoagulation due to recent history of GI bleed, requiring blood transfusions, and history of thrombocytopenia.  Current slow ventricular rate; beta-blocker has been held.  3. Bradycardia, heart rate currently in 40-50s with baseline heart rate 50-60s. Patient asymptomatic at rest.  4. Anemia with recent history of GI bleed, hemoglobin 8.0. GI following  5. Dilated cardiomyopathy with LVEF 40 to 45%  6. Coronary artery disease, status post MI and coronary stent in 1990s; recent [place] in 02/2020 with normal LV function without evidence of scar or ischemia.   7. Hypertension, started on lisinopril 5 mg during this admission, with continued spironolactone 50 mg, atenolol 25 mg, and IV Lasix; all have been held at this time due to hypotension and bradycardia after receiving oxycodone.  Recommendations; 1. Recommend resuming IV Lasix in AM if blood pressure permits 2. Recommend starting low dose metoprolol succinate 12.5 mg tomorrow if  blood pressure/heart rate permits to prevent reflex tachycardia. 3. Defer chronic anticoagulation with history of recent GI bleed and thrombocytopenia. 4. Review 2D echocardiogram 5. Trend troponin 6. Further recommendations pending patient's initial course.    Signed: Clabe Seal PA-C 06/21/2020, 4:13 PM  Discussed with Dr. Saralyn Pilar; plan made in collaboration with him.

## 2020-06-21 NOTE — ED Notes (Signed)
Pt given meal tray and helped set up to eat. Pt takes meds without difficulty.

## 2020-06-21 NOTE — ED Notes (Signed)
Pt wife at bedside. Updated on plan of care. Given ice chips. Denies further needs.

## 2020-06-21 NOTE — ED Notes (Signed)
Introduced self to pt. Pt given cup of ice water and new pillow. Pillows under legs adjusted. Denies further needs at this time.

## 2020-06-21 NOTE — ED Notes (Signed)
Pt given dinner tray but did not want any

## 2020-06-21 NOTE — ED Notes (Signed)
Pt given lunch tray.

## 2020-06-21 NOTE — ED Notes (Signed)
Admitting MD notified about BP and HR. Orders to hold any beta blockers and closely monitor BP.

## 2020-06-21 NOTE — Progress Notes (Signed)
PROGRESS NOTE    Patrick [name]  NUU:[id number] DOB: [birth year] DOA: 06/20/2020 PCP: Idelle Crouch, MD   Brief Narrative:  Mr. Croswell is a 74 y.o. M with hx. DM, HTN, A. Fib not on anticoagulation due to thrombocytopenia and history of GI bleed requiring transfusion from suspected AVM, CKD IIIa, combined CHF, moderate aortic stenosis and cirrhosis who presented with one week of worsening shortness of breath and severe leg swelling. In the ER, chest x-ray showed congestion, legs were severely swollen.  He was started on IV diuretics and admitted to the hospitalist service.  He is admitted for acute on chronic systolic and diastolic CHF.    Assessment & Plan:   Principal Problem:   Acute on chronic combined systolic and diastolic CHF (congestive heart failure) (HCC) Active Problems:   Diabetes (HCC)   Thrombocytopenia (HCC)   Chronic kidney disease, stage 3a   Iron deficiency anemia due to chronic blood loss   Chronic a-fib (HCC)   Benign essential HTN   Moderate aortic stenosis   History of GI bleed   Acute CHF (congestive heart failure) (HCC)   Acute on chronic diastolic CHF (congestive heart failure) (HCC)   Chronic systolic and diastolic CHF Moderate aortic stenosis Admitted and started on Lasix 40 IV, prompt urine output so far, about 1L.  EF 40-45% last month.  This is new reduced EF last month, evaluated by primary Cardiologist at that time, deferred GDMT or ischemic work up.   -Continue Furosemide 40 mg IV twice a day  -K supplement -Strict I/Os, daily weights, telemetry  -Daily monitoring renal function -Start low dose lisinopril for HFrEF and monitor BP and Cr -Continue atenolol -Reports feeling better, still has significant edema.    Iron deficiency anemia due to chronic blood loss History of GI bleed with AVM Hgb dropped to 8.3 today.  No clinical bleeding at this point to warrant GI evaluation.  Known AVMs.   Did require 2 units transfusion last  hospital stay. -Monitor Hgb -Transfusion threashold 8 g/dL given HF  Thrombocytopenia - stable  Alcoholic liver cirrhosis  Diabetes Glucose controlled -Continue SS corrections -COntinue DPPIV -Hold metformin  Chronic kidney disease IIIa Cr is near basleine with eGFR high 40s.  Chronic atrial fibrillation Not anticoagulation candidate due to chronic GI bleeding from AVMs and thrombocytopenia. HR controlled/low. -Continue atenolol  Hypertension -Continue atenolol, spironolactone and start lisinopril   COPD -Continue Symbicort as formulary alternative      DVT prophylaxis: SCDs Code Status: Full  Family Communication: No one at bedside, discussed with patient in detail. Disposition Plan:  Dispo: The patient is from: Home  Anticipated d/c is to: Home  Anticipated d/c date is: 3 days  Patient currently is not medically stable to d/c.   Consultants:   Procedures:  Antimicrobials:  Anti-infectives (From admission, onward)   None      Subjective: Patient was seen and examined at bedside.  No overnight events,  patient reports feeling better,  He still has significant amount of leg edema.  Objective: Vitals:   06/21/20 0510 06/21/20 1022 06/21/20 1141 06/21/20 1357  BP: (!) 115/52 (!) 118/54 (!) 123/104 (!) 95/43  Pulse: (!) 53 (!) 59 (!) 51 (!) 54  Resp: _0 Temp:      TempSrc:      SpO2: 100% 100% 100% 100%  Weight:      Height:        Intake/Output Summary (Last 24 hours) at 06/21/2020  [place] filed at 06/21/2020 1151 Gross per 24 hour  Intake --  Output 1000 ml  Net -1000 ml   Filed Weights   06/19/20 1603  Weight: 101.2 kg    Examination:  General exam: Appears calm and comfortable  Respiratory system: Clear to auscultation. Respiratory effort normal. Cardiovascular system: S1 & S2 heard, RRR. No JVD, murmurs, rubs, gallops or clicks. No pedal edema. Gastrointestinal system:  Abdomen is nondistended, soft and nontender. No organomegaly or masses felt. Normal bowel sounds heard. Central nervous system: Alert and oriented. No focal neurological deficits. Extremities:  Bilateral Leg edema. Skin: No rashes, lesions or ulcers Psychiatry: Judgement and insight appear normal. Mood & affect appropriate.     Data Reviewed: I have personally reviewed following labs and imaging studies  CBC: Recent Labs  Lab 06/19/20 1610 06/21/20 0445 06/21/20 0940  WBC 5.3 5.3  --   HGB 8.2* 8.0* 8.0*  HCT 25.9* 25.0* 25.3*  MCV 91.2 92.6  --   PLT 76* 68*  --    Basic Metabolic Panel: Recent Labs  Lab 06/19/20 1610 06/21/20 0445  NA 138 138  K 4.3 4.5  CL 107 104  CO2 22 26  GLUCOSE 104* 97  BUN 37* 39*  CREATININE 1.60* 1.64*  CALCIUM 8.7* 8.6*   GFR: Estimated Creatinine Clearance: 46.3 mL/min (A) (by C-G formula based on SCr of 1.64 mg/dL (H)). Liver Function Tests: No results for input(s): AST, ALT, ALKPHOS, BILITOT, PROT, ALBUMIN in the last 168 hours. No results for input(s): LIPASE, AMYLASE in the last 168 hours. No results for input(s): AMMONIA in the last 168 hours. Coagulation Profile: No results for input(s): INR, PROTIME in the last 168 hours. Cardiac Enzymes: No results for input(s): CKTOTAL, CKMB, CKMBINDEX, TROPONINI in the last 168 hours. BNP (last 3 results) No results for input(s): PROBNP in the last 8760 hours. HbA1C: No results for input(s): HGBA1C in the last 72 hours. CBG: Recent Labs  Lab 06/20/20 1348 06/20/20 1743 06/20/20 2358 06/21/20 0817 06/21/20 1257  GLUCAP 128* 137* 127* 96 108*   Lipid Profile: No results for input(s): CHOL, HDL, LDLCALC, TRIG, CHOLHDL, LDLDIRECT in the last 72 hours. Thyroid Function Tests: No results for input(s): TSH, T4TOTAL, FREET4, T3FREE, THYROIDAB in the last 72 hours. Anemia Panel: No results for input(s): VITAMINB12, FOLATE, FERRITIN, TIBC, IRON, RETICCTPCT in the last 72 hours. Sepsis  Labs: No results for input(s): PROCALCITON, LATICACIDVEN in the last 168 hours.  Recent Results (from the past 240 hour(s))  SARS Coronavirus 2 by RT PCR (hospital order, performed in [facility] hospital lab) Nasopharyngeal Nasopharyngeal Swab     Status: None   Collection Time: 06/21/20  8:18 AM   Specimen: Nasopharyngeal Swab  Result Value Ref Range Status   SARS Coronavirus 2 NEGATIVE NEGATIVE Final    Comment: (NOTE) SARS-CoV-2 target nucleic acids are NOT DETECTED.  The SARS-CoV-2 RNA is generally detectable in upper and lower respiratory specimens during the acute phase of infection. The lowest concentration of SARS-CoV-2 viral copies this assay can detect is 250 copies / mL. A negative result does not preclude SARS-CoV-2 infection and should not be used as the sole basis for treatment or other patient management decisions.  A negative result may occur with improper specimen collection / handling, submission of specimen other than nasopharyngeal swab, presence of viral mutation(s) within the areas targeted by this assay, and inadequate number of viral copies (<250 copies / mL). A negative result must be combined with  clinical observations, patient history, and epidemiological information.  Fact Sheet for Patients:   StrictlyIdeas.no  Fact Sheet for Healthcare Providers: BankingDealers.co.za  This test is not yet approved or  cleared by the [place] FDA and has been authorized for detection and/or diagnosis of SARS-CoV-2 by FDA under an Emergency Use Authorization (EUA).  This EUA will remain in effect (meaning this test can be used) for the duration of the COVID-19 declaration under Section 564(b)(1) of the Act, 21 U.S.C. section 360bbb-3(b)(1), unless the authorization is terminated or revoked sooner.  Performed at [facility], [address], [place], [place] [address]     Radiology Studies: DG Chest 2  View  Result Date: 06/19/2020 CLINICAL DATA:  Shortness of breath.  Leg swelling. EXAM: CHEST - 2 VIEW COMPARISON:  05/05/2020 FINDINGS: Mild wedging of a lower thoracic vertebral body is similar to the prior CT. Reverse apical lordotic positioning on the frontal radiograph. Remote upper left rib fractures. Patient rotated left. Mild cardiomegaly. Trace bilateral pleural fluid. No pneumothorax. Pulmonary venous congestion, as evidenced by pulmonary interstitial prominence and indistinctness. Similar left base pulmonary opacities and volume loss, favoring atelectasis and/or scar. IMPRESSION: Cardiomegaly with pulmonary venous congestion and trace bilateral pleural effusions. Electronically Signed   By: Abigail Miyamoto M.D.   On: 06/19/2020 16:57    Scheduled Meds:  atenolol  25 mg Oral Daily   fluticasone furoate-vilanterol  1 puff Inhalation Daily   furosemide  40 mg Intravenous Q12H   insulin aspart  0-15 Units Subcutaneous TID WC   insulin aspart  0-5 Units Subcutaneous QHS   linagliptin  5 mg Oral Daily   lisinopril  5 mg Oral Daily   melatonin  2.5 mg Oral QHS   potassium chloride  10 mEq Oral BID   sodium chloride flush  3 mL Intravenous Q12H   spironolactone  50 mg Oral Daily   Continuous Infusions:  sodium chloride       LOS: 1 day    Time spent: 25 mins    Shawna Clamp, MD [facility]   If 7PM-7AM, please contact night-coverage

## 2020-06-21 NOTE — ED Notes (Signed)
Cardiology at bedside.

## 2020-06-22 LAB — COMPREHENSIVE METABOLIC PANEL
ALT: 13 U/L (ref 0–44)
AST: 25 U/L (ref 15–41)
Albumin: 3.1 g/dL — ABNORMAL LOW (ref 3.5–5.0)
Alkaline Phosphatase: 58 U/L (ref 38–126)
Anion gap: 7 (ref 5–15)
BUN: 44 mg/dL — ABNORMAL HIGH (ref 8–23)
CO2: 27 mmol/L (ref 22–32)
Calcium: 8.4 mg/dL — ABNORMAL LOW (ref 8.9–10.3)
Chloride: 105 mmol/L (ref 98–111)
Creatinine, Ser: 2.18 mg/dL — ABNORMAL HIGH (ref 0.61–1.24)
GFR calc Af Amer: 33 mL/min — ABNORMAL LOW (ref >60)
GFR calc non Af Amer: 29 mL/min — ABNORMAL LOW (ref >60)
Glucose, Bld: 88 mg/dL (ref 70–99)
Potassium: 4.6 mmol/L (ref 3.5–5.1)
Sodium: 139 mmol/L (ref 135–145)
Total Bilirubin: 1.3 mg/dL — ABNORMAL HIGH (ref 0.3–1.2)
Total Protein: 7 g/dL (ref 6.5–8.1)

## 2020-06-22 LAB — CBC
HCT: 24.3 % — ABNORMAL LOW (ref 39.0–52.0)
Hemoglobin: 8 g/dL — ABNORMAL LOW (ref 13.0–17.0)
MCH: 29.4 pg (ref 26.0–34.0)
MCHC: 32.9 g/dL (ref 30.0–36.0)
MCV: 89.3 fL (ref 80.0–100.0)
Platelets: 65 10*3/uL — ABNORMAL LOW (ref 150–400)
RBC: 2.72 MIL/uL — ABNORMAL LOW (ref 4.22–5.81)
RDW: 18.6 % — ABNORMAL HIGH (ref 11.5–15.5)
WBC: 5 10*3/uL (ref 4.0–10.5)
nRBC: 0 % (ref 0.0–0.2)

## 2020-06-22 LAB — PHOSPHORUS: Phosphorus: 5.3 mg/dL — ABNORMAL HIGH (ref 2.5–4.6)

## 2020-06-22 LAB — MAGNESIUM: Magnesium: 1.9 mg/dL (ref 1.7–2.4)

## 2020-06-22 LAB — GLUCOSE, CAPILLARY
Glucose-Capillary: 71 mg/dL (ref 70–99)
Glucose-Capillary: 104 mg/dL — ABNORMAL HIGH (ref 70–99)
Glucose-Capillary: 117 mg/dL — ABNORMAL HIGH (ref 70–99)
Glucose-Capillary: 104 mg/dL — ABNORMAL HIGH (ref 70–99)

## 2020-06-22 MED ORDER — ATENOLOL 25 MG PO TABS
12.5000 mg | ORAL_TABLET | Freq: Every day | ORAL | Status: DC
  Administered 2020-06-23 – 2020-06-26 (×4): 12.5 mg via ORAL
  Filled 2020-06-22 (×6): qty 0.5

## 2020-06-22 MED ORDER — FUROSEMIDE 10 MG/ML IJ SOLN
20.0000 mg | Freq: Two times a day (BID) | INTRAMUSCULAR | Status: DC
  Administered 2020-06-22 – 2020-06-27 (×10): 20 mg via INTRAVENOUS
  Filled 2020-06-22 (×10): qty 2

## 2020-06-22 MED ORDER — OXYCODONE HCL 5 MG PO TABS
5.0000 mg | ORAL_TABLET | Freq: Once | ORAL | Status: AC
  Administered 2020-06-22: 5 mg via ORAL
  Filled 2020-06-22: qty 1

## 2020-06-22 NOTE — Progress Notes (Signed)
Pt  Complaining of 8/10 pain on R shoulder, per pt tylenol does not help. Dr. Dwyane Dee notified, received verbal orders for 5 mg oxy once. Will give and continue to monitor.

## 2020-06-22 NOTE — Progress Notes (Signed)
PT Cancellation Note  Patient Details Name: Patrick [name] MRN: [id number] DOB: [birth year]   Cancelled Treatment:    Reason Eval/Treat Not Completed: Fatigue/lethargy limiting ability to participate;Other (comment) (Treatment attempted, pt refused. Pt reports no no desire to mobilize at this time.) Denies any acute impairment of mobility except for DOE/SOB which he believe will only worsen with PT. Encouraged patient to mobilize next day as he has been in the bed largely since arrival, pt educated on potential for deconditioning and loss of independence.   4:06 PM, 06/22/20 Etta Grandchild, PT, DPT Physical Therapist - [facility]  [phone] ([place])    [place] 06/22/2020, 4:05 PM

## 2020-06-22 NOTE — Progress Notes (Signed)
PROGRESS NOTE    Patrick [name]  FEO:[id number] DOB: 30-Apr-1946 DOA: 06/20/2020 PCP: Idelle Crouch, MD   Brief Narrative:  Patrick [name] is a 74 y.o. M with hx. DM, HTN, A. Fib not on anticoagulation due to thrombocytopenia and history of GI bleed requiring transfusion from suspected AVM, CKD IIIa, combined CHF, moderate aortic stenosis and cirrhosis who presented with one week of worsening shortness of breath and severe leg swelling. In the ER, chest x-ray showed congestion, legs were severely swollen.  He was started on IV diuretics and admitted to the hospitalist service.  He is admitted for acute on chronic systolic and diastolic CHF. He reports feeling better, cardiology recommended reduce the dose of atenolol and laix due to worsening renal function.    Assessment & Plan:   Principal Problem:   Acute on chronic combined systolic and diastolic CHF (congestive heart failure) (HCC) Active Problems:   Diabetes (HCC)   Thrombocytopenia (HCC)   Chronic kidney disease, stage 3a   Iron deficiency anemia due to chronic blood loss   Chronic a-fib (HCC)   Benign essential HTN   Moderate aortic stenosis   History of GI bleed   Acute CHF (congestive heart failure) (HCC)   Acute on chronic diastolic CHF (congestive heart failure) (HCC)   Chronic systolic and diastolic CHF Moderate aortic stenosis Admitted and started on Lasix 40 IV, prompt urine output so far, about 1L.  EF 40-45% last month.  This is new reduced EF last month, evaluated by primary Cardiologist at that time, deferred GDMT or ischemic work up.   -Reduce  Furosemide 20 mg IV twice a day. -K supplement -Strict I/Os, daily weights, telemetry  -Daily monitoring renal function, renal functions trending up. -Start low dose lisinopril for HFrEF and monitor BP and Cr -Continue atenolol but at lower dose due to bradycardia and low BP. -Reports feeling better, still has significant edema.    Iron deficiency anemia  due to chronic blood loss History of GI bleed with AVM Hgb dropped to 8.3 today.  No clinical bleeding at this point to warrant GI evaluation.  Known AVMs.   Did require 2 units transfusion last hospital stay. -Monitor Hgb -Transfusion threashold 8 g/dL given HF  Thrombocytopenia - stable  Alcoholic liver cirrhosis  Diabetes Glucose controlled -Continue SS corrections -COntinue DPPIV -Hold metformin  Chronic kidney disease IIIa Cr is near basleine with eGFR high 40s. Serum creatinine trending up because of Lasix,  will reduce Lasix to 20 mg twice daily. Monitor renal functions.  Chronic atrial fibrillation Not anticoagulation candidate due to chronic GI bleeding from AVMs and thrombocytopenia. HR controlled/low. -Continue atenolol  Hypertension -Continue atenolol, spironolactone and start lisinopril   COPD -Continue Symbicort as formulary alternative      DVT prophylaxis: SCDs Code Status: Full  Family Communication: No one at bedside, discussed with patient in detail. Disposition Plan:  Dispo: The patient is from: Home  Anticipated d/c is to: Home  Anticipated d/c date is: 3 days  Patient currently is not medically stable to d/c.   Consultants:   Procedures:  Antimicrobials:  Anti-infectives (From admission, onward)   None      Subjective: Patient was seen and examined at bedside.  No overnight events, Patient complains of having right shoulder pain, wants pain medication stronger than Tylenol.  Leg swelling is better but is still present.  Objective: Vitals:   06/21/20 2111 06/22/20 0049 06/22/20 0501 06/22/20 0742  BP: (!) 97/54 (!) 103/39 (!) 113/50 Marland Kitchen)  101/50  Pulse: (!) 104 (!) 44  (!) 50  Resp:  '20 20 17  ' Temp: 97.7 F (36.5 C) 97.6 F (36.4 C) 97.9 F (36.6 C) (!) 97.5 F (36.4 C)  TempSrc: Oral Oral  Oral  SpO2: 95% 99% 99% 98%  Weight: 110.8 kg  111.2 kg   Height: '5\' 9"'  (1.753 m)        Intake/Output Summary (Last 24 hours) at 06/22/2020 0757 Last data filed at 06/22/2020 9233 Gross per 24 hour  Intake --  Output 1100 ml  Net -1100 ml   Filed Weights   06/19/20 1603 06/21/20 2111 06/22/20 0501  Weight: 101.2 kg 110.8 kg 111.2 kg    Examination:  General exam: Appears calm and comfortable  Respiratory system: Clear to auscultation. Respiratory effort normal. Cardiovascular system: S1 & S2 heard, RRR. No JVD, murmurs, rubs, gallops or clicks. No pedal edema. Gastrointestinal system: Abdomen is nondistended, soft and nontender. No organomegaly or masses felt. Normal bowel sounds heard. Central nervous system: Alert and oriented. No focal neurological deficits. Extremities:  Bilateral Leg edema. Skin: No rashes, lesions or ulcers Psychiatry: Judgement and insight appear normal. Mood & affect appropriate.     Data Reviewed: I have personally reviewed following labs and imaging studies  CBC: Recent Labs  Lab 06/19/20 1610 06/21/20 0445 06/21/20 0940 06/22/20 0450  WBC 5.3 5.3  --  5.0  HGB 8.2* 8.0* 8.0* 8.0*  HCT 25.9* 25.0* 25.3* 24.3*  MCV 91.2 92.6  --  89.3  PLT 76* 68*  --  65*   Basic Metabolic Panel: Recent Labs  Lab 06/19/20 1610 06/21/20 0445 06/22/20 0450  NA 138 138 139  K 4.3 4.5 4.6  CL 107 104 105  CO2 '22 26 27  ' GLUCOSE 104* 97 88  BUN 37* 39* 44*  CREATININE 1.60* 1.64* 2.18*  CALCIUM 8.7* 8.6* 8.4*  MG  --   --  1.9  PHOS  --   --  5.3*   GFR: Estimated Creatinine Clearance: 36.5 mL/min (A) (by C-G formula based on SCr of 2.18 mg/dL (H)). Liver Function Tests: Recent Labs  Lab 06/22/20 0450  AST 25  ALT 13  ALKPHOS 58  BILITOT 1.3*  PROT 7.0  ALBUMIN 3.1*   No results for input(s): LIPASE, AMYLASE in the last 168 hours. No results for input(s): AMMONIA in the last 168 hours. Coagulation Profile: No results for input(s): INR, PROTIME in the last 168 hours. Cardiac Enzymes: No results for input(s): CKTOTAL,  CKMB, CKMBINDEX, TROPONINI in the last 168 hours. BNP (last 3 results) No results for input(s): PROBNP in the last 8760 hours. HbA1C: No results for input(s): HGBA1C in the last 72 hours. CBG: Recent Labs  Lab 06/21/20 1257 06/21/20 1708 06/21/20 2038 06/21/20 2142 06/22/20 0741  GLUCAP 108* 99 87 93 71   Lipid Profile: No results for input(s): CHOL, HDL, LDLCALC, TRIG, CHOLHDL, LDLDIRECT in the last 72 hours. Thyroid Function Tests: No results for input(s): TSH, T4TOTAL, FREET4, T3FREE, THYROIDAB in the last 72 hours. Anemia Panel: No results for input(s): VITAMINB12, FOLATE, FERRITIN, TIBC, IRON, RETICCTPCT in the last 72 hours. Sepsis Labs: No results for input(s): PROCALCITON, LATICACIDVEN in the last 168 hours.  Recent Results (from the past 240 hour(s))  SARS Coronavirus 2 by RT PCR (hospital order, performed in [facility] hospital lab) Nasopharyngeal Nasopharyngeal Swab     Status: None   Collection Time: 06/21/20  8:18 AM   Specimen: Nasopharyngeal Swab  Result Value Ref  Range Status   SARS Coronavirus 2 NEGATIVE NEGATIVE Final    Comment: (NOTE) SARS-CoV-2 target nucleic acids are NOT DETECTED.  The SARS-CoV-2 RNA is generally detectable in upper and lower respiratory specimens during the acute phase of infection. The lowest concentration of SARS-CoV-2 viral copies this assay can detect is 250 copies / mL. A negative result does not preclude SARS-CoV-2 infection and should not be used as the sole basis for treatment or other patient management decisions.  A negative result may occur with improper specimen collection / handling, submission of specimen other than nasopharyngeal swab, presence of viral mutation(s) within the areas targeted by this assay, and inadequate number of viral copies (<250 copies / mL). A negative result must be combined with clinical observations, patient history, and epidemiological information.  Fact Sheet for Patients:    StrictlyIdeas.no  Fact Sheet for Healthcare Providers: BankingDealers.co.za  This test is not yet approved or  cleared by the [place] FDA and has been authorized for detection and/or diagnosis of SARS-CoV-2 by FDA under an Emergency Use Authorization (EUA).  This EUA will remain in effect (meaning this test can be used) for the duration of the COVID-19 declaration under Section 564(b)(1) of the Act, 21 U.S.C. section 360bbb-3(b)(1), unless the authorization is terminated or revoked sooner.  Performed at [facility], [address] [place], [place] [address]     Radiology Studies: No results found.  Scheduled Meds: . atenolol  25 mg Oral Daily  . fluticasone furoate-vilanterol  1 puff Inhalation Daily  . furosemide  40 mg Intravenous Q12H  . insulin aspart  0-15 Units Subcutaneous TID WC  . insulin aspart  0-5 Units Subcutaneous QHS  . linagliptin  5 mg Oral Daily  . lisinopril  5 mg Oral Daily  . melatonin  2.5 mg Oral QHS  . potassium chloride  10 mEq Oral BID  . sodium chloride flush  3 mL Intravenous Q12H  . spironolactone  50 mg Oral Daily   Continuous Infusions: . sodium chloride       LOS: 2 days    Time spent: 25 mins    Shawna Clamp, MD [facility]   If 7PM-7AM, please contact night-coverage

## 2020-06-22 NOTE — Progress Notes (Signed)
Cardiology aware about pt. See consultation note. Pt being closely monitored.

## 2020-06-22 NOTE — Progress Notes (Signed)
[facility]    SUBJECTIVE: The patient denies chest pain or shortness of breath at rest. He has not ambulated today. His main complaint this morning is his shoulder pain and need for pain medication other than Tylenol.    Vitals:   06/21/20 2111 06/22/20 0049 06/22/20 0501 06/22/20 0742  BP: (!) 97/54 (!) 103/39 (!) 113/50 (!) 101/50  Pulse: (!) 104 (!) 44  (!) 50  Resp:  20 20 17   Temp: 97.7 F (36.5 C) 97.6 F (36.4 C) 97.9 F (36.6 C) (!) 97.5 F (36.4 C)  TempSrc: Oral Oral  Oral  SpO2: 95% 99% 99% 98%  Weight: 110.8 kg  111.2 kg   Height: 5\' 9"  (1.753 m)        Intake/Output Summary (Last 24 hours) at 06/22/2020 5465 Last data filed at 06/22/2020 0354 Gross per 24 hour  Intake --  Output 1100 ml  Net -1100 ml      PHYSICAL EXAM   General: Obese elderly gentleman lying in bed, in no acute distress,  HEENT:  Normocephalic and atramatic Neck:   No JVD.  Lungs: normal effort of breathing on room air, mildly diminished breathsounds, with expiratory wheezing, speaking in full sentences Heart: irregularly irregular . 2/6 systolic murmur Abdomen: nontender, distended, soft  Msk:  Gait not assessed, no obvious deformities Extremities: 2+ pitting bilateral lower extremity edema with blisters   Neuro: Alert and oriented X 3. Psych:  Good affect, responds appropriately   LABS: Basic Metabolic Panel: Recent Labs    06/21/20 0445 06/22/20 0450  NA 138 139  K 4.5 4.6  CL 104 105  CO2 26 27  GLUCOSE 97 88  BUN 39* 44*  CREATININE 1.64* 2.18*  CALCIUM 8.6* 8.4*  MG  --  1.9  PHOS  --  5.3*   Liver Function Tests: Recent Labs    06/22/20 0450  AST 25  ALT 13  ALKPHOS 58  BILITOT 1.3*  PROT 7.0  ALBUMIN 3.1*   No results for input(s): LIPASE, AMYLASE in the last 72 hours. CBC: Recent Labs    06/21/20 0445 06/21/20 0445 06/21/20 0940 06/22/20 0450  WBC 5.3  --   --  5.0  HGB 8.0*   < > 8.0* 8.0*  HCT 25.0*   < > 25.3* 24.3*  MCV 92.6  --   --   89.3  PLT 68*  --   --  65*   < > = values in this interval not displayed.   Cardiac Enzymes: No results for input(s): CKTOTAL, CKMB, CKMBINDEX, TROPONINI in the last 72 hours. BNP: Invalid input(s): POCBNP D-Dimer: No results for input(s): DDIMER in the last 72 hours. Hemoglobin A1C: No results for input(s): HGBA1C in the last 72 hours. Fasting Lipid Panel: No results for input(s): CHOL, HDL, LDLCALC, TRIG, CHOLHDL, LDLDIRECT in the last 72 hours. Thyroid Function Tests: No results for input(s): TSH, T4TOTAL, T3FREE, THYROIDAB in the last 72 hours.  Invalid input(s): FREET3 Anemia Panel: No results for input(s): VITAMINB12, FOLATE, FERRITIN, TIBC, IRON, RETICCTPCT in the last 72 hours.  No results found.    TELEMETRY: atrial fibrillation, 50s  ASSESSMENT AND PLAN:  Principal Problem:   Acute on chronic combined systolic and diastolic CHF (congestive heart failure) (HCC) Active Problems:   Diabetes (HCC)   Thrombocytopenia (HCC)   Chronic kidney disease, stage 3a   Iron deficiency anemia due to chronic blood loss   Chronic a-fib (HCC)   Benign essential HTN   Moderate aortic  stenosis   History of GI bleed   Acute CHF (congestive heart failure) (HCC)   Acute on chronic diastolic CHF (congestive heart failure) ([place])    1. Acute on chronic combined systolic and diastolic CHF, presenting with several month history of progressive exertional dyspnea and lower extremity edema with chest x-ray revealing pulmonary venous congestion and trace bilateral pleural effusions with BNP greater than 700.  Initial diuresis and clinical improvement with IV Lasix. 2D echocardiogram on 05/06/2020 revealed mildly decreased left ventricular function with LVEF 40 to 45% with severely dilated left ventricle, moderately elevated pulmonary artery systolic pressure, severe biatrial enlargement, trivial pericardial effusion, mild mitral,aortic, and tricuspid regurgitation, and mild to moderate aortic  valve sclerosis/calcification without evidence of aortic stenosis with aortic valve mean gradient 8.0 mmHg, peak gradient 15.2 mmHg, and valve area 1.35 cm.  2. Chronic atrial fibrillation, not on chronic anticoagulation due to recent history of GI bleed, requiring blood transfusions, and history of thrombocytopenia.  Current slow ventricular rate.  3. Bradycardia, heart rate currently in 50s while on atenolol 25 mg with baseline heart rate 50-60s. Patient asymptomatic at rest.  4. Anemia with recent history of GI bleed, hemoglobin 8.0. GI following  5. Dilated cardiomyopathy with LVEF 40 to 45%, on atenolol; lisinopril initiated, then held due to worsening renal function and hypotension.  6. Coronary artery disease, status post MI and coronary stent in 1990s; recent [place] in 02/2020 with normal LV function without evidence of scar or ischemia. Patient denies chest pain.   7. Hypertension, low this morning, but better. Patient asymptomatic. Currently on spironolactone 50 mg, atenolol 25 mg, and IV Lasix 40 mg BID. Blood pressure dropped to 80 systolic after receiving oxycodone.  8. AKI on CKD, creatinine elevated to 2.18 today from 1.64 yesterday  Recommendations; 1. Reduce atenolol to 12.5 mg daily due to hypotension and bradycardia; continue beta blocker at this time to prevent reflex tachycardia. 2. Reduce IV Lasix to 20 mg BID due to worsening renal function; continue to monitor daily weights, I&Os, avoid nephrotoxic agents 3. Pain management per hospitalist 4. Defer chronic anticoagulation with history of recent GI bleed and thrombocytopenia.  Discussed with Dr. Saralyn Pilar who agrees with above plan.  Clabe Seal, PA-C 06/22/2020 8:23 AM

## 2020-06-23 LAB — BASIC METABOLIC PANEL WITH GFR
Anion gap: 10 (ref 5–15)
BUN: 50 mg/dL — ABNORMAL HIGH (ref 8–23)
CO2: 26 mmol/L (ref 22–32)
Calcium: 8.8 mg/dL — ABNORMAL LOW (ref 8.9–10.3)
Chloride: 105 mmol/L (ref 98–111)
Creatinine, Ser: 2.22 mg/dL — ABNORMAL HIGH (ref 0.61–1.24)
GFR calc Af Amer: 33 mL/min — ABNORMAL LOW
GFR calc non Af Amer: 28 mL/min — ABNORMAL LOW
Glucose, Bld: 90 mg/dL (ref 70–99)
Potassium: 5.1 mmol/L (ref 3.5–5.1)
Sodium: 141 mmol/L (ref 135–145)

## 2020-06-23 LAB — CBC
HCT: 25.3 % — ABNORMAL LOW (ref 39.0–52.0)
Hemoglobin: 8.1 g/dL — ABNORMAL LOW (ref 13.0–17.0)
MCH: 29.3 pg (ref 26.0–34.0)
MCHC: 32 g/dL (ref 30.0–36.0)
MCV: 91.7 fL (ref 80.0–100.0)
Platelets: 70 10*3/uL — ABNORMAL LOW (ref 150–400)
RBC: 2.76 MIL/uL — ABNORMAL LOW (ref 4.22–5.81)
RDW: 18.6 % — ABNORMAL HIGH (ref 11.5–15.5)
WBC: 5.4 10*3/uL (ref 4.0–10.5)
nRBC: 0 % (ref 0.0–0.2)

## 2020-06-23 LAB — GLUCOSE, CAPILLARY
Glucose-Capillary: 85 mg/dL (ref 70–99)
Glucose-Capillary: 108 mg/dL — ABNORMAL HIGH (ref 70–99)
Glucose-Capillary: 85 mg/dL (ref 70–99)
Glucose-Capillary: 96 mg/dL (ref 70–99)

## 2020-06-23 MED ORDER — OXYCODONE HCL 5 MG PO TABS
5.0000 mg | ORAL_TABLET | Freq: Two times a day (BID) | ORAL | Status: DC | PRN
  Administered 2020-06-23 (×2): 5 mg via ORAL
  Filled 2020-06-23 (×2): qty 1

## 2020-06-23 NOTE — Progress Notes (Signed)
PROGRESS NOTE    Patrick [name]  WER:[id number] DOB: May 03, 1946 DOA: 06/20/2020 PCP: Idelle Crouch, MD   Brief Narrative:  Patrick [name] is a 74 y.o. M with hx. DM, HTN, A. Fib not on anticoagulation due to thrombocytopenia and history of GI bleed requiring transfusion from suspected AVM, CKD IIIa, combined CHF, moderate aortic stenosis and cirrhosis who presented with one week of worsening shortness of breath and severe leg swelling. In the ER, chest x-ray showed congestion, legs were severely swollen.  He was started on IV diuretics and admitted to the hospitalist service.  He is admitted for acute on chronic systolic and diastolic CHF. He reports feeling better, cardiology recommended reduce the dose of atenolol and laix due to worsening renal function.  Patient is making slow improvement,  nephrology consulted due to the worsening renal functions.    Assessment & Plan:   Principal Problem:   Acute on chronic combined systolic and diastolic CHF (congestive heart failure) (HCC) Active Problems:   Diabetes (HCC)   Thrombocytopenia (HCC)   Chronic kidney disease, stage 3a   Iron deficiency anemia due to chronic blood loss   Chronic a-fib (HCC)   Benign essential HTN   Moderate aortic stenosis   History of GI bleed   Acute CHF (congestive heart failure) (HCC)   Acute on chronic diastolic CHF (congestive heart failure) (HCC)   Chronic systolic and diastolic CHF Moderate aortic stenosis Admitted and started on Lasix 40 IV, prompt urine output so far, about 1L.  EF 40-45% last month.  This is new reduced EF last month, evaluated by primary Cardiologist at that time, deferred GDMT or ischemic work up.   -Reduce  Furosemide 20 mg IV twice a day. -K supplement -Strict I/Os, daily weights, telemetry  -Daily monitoring renal function, renal functions trending up. -hold low dose lisinopril for HFrEF and monitor BP and Cr -Continue atenolol but at lower dose due to bradycardia  and low BP. -Reports feeling better, still has significant edema.    Iron deficiency anemia due to chronic blood loss History of GI bleed with AVM Hgb dropped to 8.3 today.  No clinical bleeding at this point to warrant GI evaluation.  Known AVMs.   Did require 2 units transfusion last hospital stay. -Hgb remained stable above 8. -Transfusion threashold 8 g/dL given HF  Thrombocytopenia - stable  Alcoholic liver cirrhosis  Diabetes Glucose controlled -Continue SS corrections -COntinue DPPIV -Hold metformin  Chronic kidney disease IIIa Cr is near basleine with eGFR high 40s. Serum creatinine trending up because of Lasix, will reduce Lasix to 20 mg twice daily. Monitor renal functions.  Nephrology consulted will follow up recommendation.  Chronic atrial fibrillation Not anticoagulation candidate due to chronic GI bleeding from AVMs and thrombocytopenia. HR controlled/low. -Continue atenolol  Hypertension -Continue atenolol, spironolactone and start lisinopril   COPD -Continue Symbicort as formulary alternative      DVT prophylaxis: SCDs Code Status: Full  Family Communication: No one at bedside, discussed with patient in detail. Disposition Plan:  Dispo: The patient is from: Home  Anticipated d/c is to: Home  Anticipated d/c date is: 3 days  Patient currently is not medically stable to d/c.   Consultants:   Procedures:  Antimicrobials:  Anti-infectives (From admission, onward)   None      Subjective: Patient was seen and examined at bedside.  No overnight events, Patient complains of having right shoulder pain, wants pain medication stronger than Tylenol.  Leg swelling is better but  is still present.  He appears better,  sitting comfortably on the bed.  Objective: Vitals:   06/22/20 1923 06/23/20 0430 06/23/20 0803 06/23/20 1139  BP: (!) 108/35 (!) 118/52 (!) 127/53 101/68  Pulse: (!) 51 (!) 51 60 (!) 53   Resp: '17 18 19 19  ' Temp: 97.8 F (36.6 C) 97.7 F (36.5 C) 98.2 F (36.8 C) 98 F (36.7 C)  TempSrc: Oral Oral Oral Oral  SpO2: 95% 99% 98% 99%  Weight:  111.2 kg    Height:        Intake/Output Summary (Last 24 hours) at 06/23/2020 1533 Last data filed at 06/23/2020 1427 Gross per 24 hour  Intake 960 ml  Output 900 ml  Net 60 ml   Filed Weights   06/21/20 2111 06/22/20 0501 06/23/20 0430  Weight: 110.8 kg 111.2 kg 111.2 kg    Examination:  General exam: Appears calm and comfortable  Respiratory system: Clear to auscultation. Respiratory effort normal. Cardiovascular system: S1 & S2 heard, RRR. No JVD, murmurs, rubs, gallops or clicks. No pedal edema. Gastrointestinal system: Abdomen is nondistended, soft and nontender. No organomegaly or masses felt. Normal bowel sounds heard. Central nervous system: Alert and oriented. No focal neurological deficits. Extremities:  Bilateral Leg edema. Skin: No rashes, lesions or ulcers Psychiatry: Judgement and insight appear normal. Mood & affect appropriate.     Data Reviewed: I have personally reviewed following labs and imaging studies  CBC: Recent Labs  Lab 06/19/20 1610 06/21/20 0445 06/21/20 0940 06/22/20 0450 06/23/20 0621  WBC 5.3 5.3  --  5.0 5.4  HGB 8.2* 8.0* 8.0* 8.0* 8.1*  HCT 25.9* 25.0* 25.3* 24.3* 25.3*  MCV 91.2 92.6  --  89.3 91.7  PLT 76* 68*  --  65* 70*   Basic Metabolic Panel: Recent Labs  Lab 06/19/20 1610 06/21/20 0445 06/22/20 0450 06/23/20 0621  NA 138 138 139 141  K 4.3 4.5 4.6 5.1  CL 107 104 105 105  CO2 '22 26 27 26  ' GLUCOSE 104* 97 88 90  BUN 37* 39* 44* 50*  CREATININE 1.60* 1.64* 2.18* 2.22*  CALCIUM 8.7* 8.6* 8.4* 8.8*  MG  --   --  1.9  --   PHOS  --   --  5.3*  --    GFR: Estimated Creatinine Clearance: 35.9 mL/min (A) (by C-G formula based on SCr of 2.22 mg/dL (H)). Liver Function Tests: Recent Labs  Lab 06/22/20 0450  AST 25  ALT 13  ALKPHOS 58  BILITOT 1.3*  PROT  7.0  ALBUMIN 3.1*   No results for input(s): LIPASE, AMYLASE in the last 168 hours. No results for input(s): AMMONIA in the last 168 hours. Coagulation Profile: No results for input(s): INR, PROTIME in the last 168 hours. Cardiac Enzymes: No results for input(s): CKTOTAL, CKMB, CKMBINDEX, TROPONINI in the last 168 hours. BNP (last 3 results) No results for input(s): PROBNP in the last 8760 hours. HbA1C: No results for input(s): HGBA1C in the last 72 hours. CBG: Recent Labs  Lab 06/22/20 1123 06/22/20 1608 06/22/20 2107 06/23/20 0801 06/23/20 1139  GLUCAP 104* 117* 104* 85 108*   Lipid Profile: No results for input(s): CHOL, HDL, LDLCALC, TRIG, CHOLHDL, LDLDIRECT in the last 72 hours. Thyroid Function Tests: No results for input(s): TSH, T4TOTAL, FREET4, T3FREE, THYROIDAB in the last 72 hours. Anemia Panel: No results for input(s): VITAMINB12, FOLATE, FERRITIN, TIBC, IRON, RETICCTPCT in the last 72 hours. Sepsis Labs: No results for input(s): PROCALCITON, LATICACIDVEN in  the last 168 hours.  Recent Results (from the past 240 hour(s))  SARS Coronavirus 2 by RT PCR (hospital order, performed in [facility] hospital lab) Nasopharyngeal Nasopharyngeal Swab     Status: None   Collection Time: 06/21/20  8:18 AM   Specimen: Nasopharyngeal Swab  Result Value Ref Range Status   SARS Coronavirus 2 NEGATIVE NEGATIVE Final    Comment: (NOTE) SARS-CoV-2 target nucleic acids are NOT DETECTED.  The SARS-CoV-2 RNA is generally detectable in upper and lower respiratory specimens during the acute phase of infection. The lowest concentration of SARS-CoV-2 viral copies this assay can detect is 250 copies / mL. A negative result does not preclude SARS-CoV-2 infection and should not be used as the sole basis for treatment or other patient management decisions.  A negative result may occur with improper specimen collection / handling, submission of specimen other than nasopharyngeal swab,  presence of viral mutation(s) within the areas targeted by this assay, and inadequate number of viral copies (<250 copies / mL). A negative result must be combined with clinical observations, patient history, and epidemiological information.  Fact Sheet for Patients:   StrictlyIdeas.no  Fact Sheet for Healthcare Providers: BankingDealers.co.za  This test is not yet approved or  cleared by the [place] FDA and has been authorized for detection and/or diagnosis of SARS-CoV-2 by FDA under an Emergency Use Authorization (EUA).  This EUA will remain in effect (meaning this test can be used) for the duration of the COVID-19 declaration under Section 564(b)(1) of the Act, 21 U.S.C. section 360bbb-3(b)(1), unless the authorization is terminated or revoked sooner.  Performed at [facility], [address] [place], [place] [address]     Radiology Studies: No results found.  Scheduled Meds: . atenolol  12.5 mg Oral Daily  . fluticasone furoate-vilanterol  1 puff Inhalation Daily  . furosemide  20 mg Intravenous Q12H  . insulin aspart  0-15 Units Subcutaneous TID WC  . insulin aspart  0-5 Units Subcutaneous QHS  . linagliptin  5 mg Oral Daily  . melatonin  2.5 mg Oral QHS  . sodium chloride flush  3 mL Intravenous Q12H  . spironolactone  50 mg Oral Daily   Continuous Infusions: . sodium chloride       LOS: 3 days    Time spent: 25 mins    Shawna Clamp, MD [facility]   If 7PM-7AM, please contact night-coverage

## 2020-06-23 NOTE — Care Management Important Message (Signed)
Important Message  Patient Details  Name: Patrick [name] MRN: [id number] Date of Birth: 1946/05/23   Medicare Important Message Given:  Yes     Dannette Barbara 06/23/2020, 1:58 PM

## 2020-06-23 NOTE — Progress Notes (Signed)
[facility] Cardiology  SUBJECTIVE: Patient laying in bed, denies chest pain or shortness of breath at rest.  Patient does complain of shoulder pain   Vitals:   06/22/20 1536 06/22/20 1537 06/22/20 1923 06/23/20 0430  BP: (!) 92/46 (!) 94/49 (!) 108/35 (!) 118/52  Pulse: (!) 55  (!) 51 (!) 51  Resp: 17  17 18   Temp: 97.9 F (36.6 C)  97.8 F (36.6 C) 97.7 F (36.5 C)  TempSrc: Oral  Oral Oral  SpO2: 92%  95% 99%  Weight:    111.2 kg  Height:         Intake/Output Summary (Last 24 hours) at 06/23/2020 0747 Last data filed at 06/23/2020 0600 Gross per 24 hour  Intake 960 ml  Output 275 ml  Net 685 ml      PHYSICAL EXAM  General: Well developed, well nourished, in no acute distress HEENT:  Normocephalic and atramatic Neck:  No JVD.  Lungs: Clear bilaterally to auscultation and percussion. Heart: HRRR . Normal S1 and S2 without gallops or murmurs.  Abdomen: Bowel sounds are positive, abdomen soft and non-tender  Msk:  Back normal, normal gait. Normal strength and tone for age. Extremities: No clubbing, cyanosis or edema.   Neuro: Alert and oriented X 3. Psych:  Good affect, responds appropriately   LABS: Basic Metabolic Panel: Recent Labs    06/22/20 0450 06/23/20 0621  NA 139 141  K 4.6 5.1  CL 105 105  CO2 27 26  GLUCOSE 88 90  BUN 44* 50*  CREATININE 2.18* 2.22*  CALCIUM 8.4* 8.8*  MG 1.9  --   PHOS 5.3*  --    Liver Function Tests: Recent Labs    06/22/20 0450  AST 25  ALT 13  ALKPHOS 58  BILITOT 1.3*  PROT 7.0  ALBUMIN 3.1*   No results for input(s): LIPASE, AMYLASE in the last 72 hours. CBC: Recent Labs    06/22/20 0450 06/23/20 0621  WBC 5.0 5.4  HGB 8.0* 8.1*  HCT 24.3* 25.3*  MCV 89.3 91.7  PLT 65* 70*   Cardiac Enzymes: No results for input(s): CKTOTAL, CKMB, CKMBINDEX, TROPONINI in the last 72 hours. BNP: Invalid input(s): POCBNP D-Dimer: No results for input(s): DDIMER in the last 72 hours. Hemoglobin A1C: No results for  input(s): HGBA1C in the last 72 hours. Fasting Lipid Panel: No results for input(s): CHOL, HDL, LDLCALC, TRIG, CHOLHDL, LDLDIRECT in the last 72 hours. Thyroid Function Tests: No results for input(s): TSH, T4TOTAL, T3FREE, THYROIDAB in the last 72 hours.  Invalid input(s): FREET3 Anemia Panel: No results for input(s): VITAMINB12, FOLATE, FERRITIN, TIBC, IRON, RETICCTPCT in the last 72 hours.  No results found.   Echo LVEF 40 to 45% by 2D echocardiogram 05/06/2020  TELEMETRY: Atrial fibrillation at a rate of 54 bpm:  ASSESSMENT AND PLAN:  Principal Problem:   Acute on chronic combined systolic and diastolic CHF (congestive heart failure) (HCC) Active Problems:   Diabetes (HCC)   Thrombocytopenia (HCC)   Chronic kidney disease, stage 3a   Iron deficiency anemia due to chronic blood loss   Chronic a-fib (HCC)   Benign essential HTN   Moderate aortic stenosis   History of GI bleed   Acute CHF (congestive heart failure) (HCC)   Acute on chronic diastolic CHF (congestive heart failure) ([place])    1. Acute on chronic combined systolic and diastolic CHF, presenting withseveral month history ofprogressive exertional dyspnea and lower extremity edema with chest x-ray revealing pulmonary venous congestion and  trace bilateral pleural effusions with BNP greater than 700.Initial diuresis and clinical improvement with IV Lasix. 2D echocardiogramon 05/06/2020 revealed mildlydecreasedleft ventricular function with LVEF 40 to 45% with severely dilated left ventricle, moderately elevated pulmonary artery systolicpressure, severe biatrial enlargement, trivial pericardial effusion, mild mitral,aortic, and tricuspid regurgitation, and mild to moderate aortic valve sclerosis/calcification without evidence for significant aortic stenosis with aortic valve mean gradient 8.0 mmHg, peak gradient 15.2 mmHg, with a calculated aortic valve area 1.35 cm.  2.Chronic atrial fibrillation, not on chronic  anticoagulation due to recent history of GI bleed, requiring blood transfusions, and history of thrombocytopenia.Current ventricular rate 54 bpm.  Atenolol dose decreased to 12.5 mg daily.  3.Bradycardia,with atrial fibrillation at a rate of 54 bpm on lower dose atenolol 12.5 mg daily.  4.Anemia with recent history of GI bleed, hemoglobin 8.0.GI following  5. Mild dilated cardiomyopathywith LVEF 40 to 45%, on atenolol; lisinopril initiated, then held due to worsening renal function and hypotension.  6.Coronary artery disease, status post MI and coronary stentin 1990s; recent [place] in 02/2020 with normal LV function without evidence of scar or ischemia.Patient denies chest pain.   7.Hypertension,  blood pressure well controlled on current BP medications which include spironolactone 50 mg,atenolol 12.5 mg, and IV Lasix 20 mg BID. Blood pressure dropped to 80 systolic after receiving oxycodone.  8. AKI on CKD,  BUN and creatinine were 50 and 2.22, respectively  Recommendations  1.  Agree with overall current therapy 2.  Continue diuresis with lower dose furosemide 20 mg IV twice daily 3.  Carefully monitor renal status 4.  Defer chronic anticoagulation with history of recent GI bleed and thrombocytopenia 5.  Pain management per hospitalist 6.  Consider nephrology consult    Isaias Cowman, MD, PhD, [facility] 06/23/2020 7:47 AM

## 2020-06-24 ENCOUNTER — Inpatient Hospital Stay: Payer: Medicare Other

## 2020-06-24 LAB — RETICULOCYTES
Immature Retic Fract: 14.1 % (ref 2.3–15.9)
RBC.: 2.74 MIL/uL — ABNORMAL LOW (ref 4.22–5.81)
Retic Count, Absolute: 43.8 10*3/uL (ref 19.0–186.0)
Retic Ct Pct: 1.6 % (ref 0.4–3.1)

## 2020-06-24 LAB — BASIC METABOLIC PANEL
Anion gap: 8 (ref 5–15)
BUN: 50 mg/dL — ABNORMAL HIGH (ref 8–23)
CO2: 27 mmol/L (ref 22–32)
Calcium: 8.5 mg/dL — ABNORMAL LOW (ref 8.9–10.3)
Chloride: 103 mmol/L (ref 98–111)
Creatinine, Ser: 1.97 mg/dL — ABNORMAL HIGH (ref 0.61–1.24)
GFR calc Af Amer: 38 mL/min — ABNORMAL LOW (ref >60)
GFR calc non Af Amer: 33 mL/min — ABNORMAL LOW (ref >60)
Glucose, Bld: 92 mg/dL (ref 70–99)
Potassium: 5.1 mmol/L (ref 3.5–5.1)
Sodium: 138 mmol/L (ref 135–145)

## 2020-06-24 LAB — IRON AND TIBC
Iron: 83 ug/dL (ref 45–182)
Saturation Ratios: 22 % (ref 17.9–39.5)
TIBC: 385 ug/dL (ref 250–450)
UIBC: 302 ug/dL

## 2020-06-24 LAB — FERRITIN: Ferritin: 69 ng/mL (ref 24–336)

## 2020-06-24 LAB — PHOSPHORUS: Phosphorus: 4.7 mg/dL — ABNORMAL HIGH (ref 2.5–4.6)

## 2020-06-24 LAB — VITAMIN B12: Vitamin B-12: 1019 pg/mL — ABNORMAL HIGH (ref 180–914)

## 2020-06-24 LAB — HEMOGLOBIN AND HEMATOCRIT, BLOOD
HCT: 25.1 % — ABNORMAL LOW (ref 39.0–52.0)
Hemoglobin: 8.4 g/dL — ABNORMAL LOW (ref 13.0–17.0)

## 2020-06-24 LAB — GLUCOSE, CAPILLARY
Glucose-Capillary: 91 mg/dL (ref 70–99)
Glucose-Capillary: 100 mg/dL — ABNORMAL HIGH (ref 70–99)
Glucose-Capillary: 137 mg/dL — ABNORMAL HIGH (ref 70–99)

## 2020-06-24 LAB — MAGNESIUM: Magnesium: 2.1 mg/dL (ref 1.7–2.4)

## 2020-06-24 LAB — FOLATE: Folate: 32 ng/mL (ref >5.9)

## 2020-06-24 MED ORDER — IPRATROPIUM-ALBUTEROL 0.5-2.5 (3) MG/3ML IN SOLN
3.0000 mL | Freq: Once | RESPIRATORY_TRACT | Status: AC
  Administered 2020-06-24: 3 mL via RESPIRATORY_TRACT
  Filled 2020-06-24: qty 3

## 2020-06-24 MED ORDER — FUROSEMIDE 10 MG/ML IJ SOLN
40.0000 mg | Freq: Once | INTRAMUSCULAR | Status: AC
  Administered 2020-06-24: 40 mg via INTRAVENOUS
  Filled 2020-06-24: qty 4

## 2020-06-24 NOTE — Consult Note (Addendum)
CRISTIN SZATKOWSKI MRN: [id number] DOB/AGE: 75-Nov-1947 75 y.o. Primary Care Physician:Sparks, Leonie Douglas, MD Admit date: 06/20/2020 Chief Complaint:  Chief Complaint  Patient presents with  . Shortness of Breath  . Leg Swelling   HPI: Patient is a 74 year old Caucasian male with a past medical history of diabetes mellitus , HTN, A. 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  Patient was later discharged with plans for outpatient work-up but he presented to the emergency room with complaints of swelling in the legs associated with progressively worsening dyspnea on exertion.   Upon evaluation in the ER patient was found to have hemoglobin of 8.2, down from 10.4  Chest x-ray showed pulmonary vascular congestion.  Patient was started on diuretics.  Patient had rising creatinine and nephrology was consulted Upon evaluation patient is negative by 4 L since his admission, patient responding well to diuretics Patient was seen today on the second floor Patient offers no specific complaint No complaint of chest pain No complaint of nausea/vomiting/diarrhea No complaint of hematuria No complaint of change in speech or vision Patient states his shortness of breath is better than before   Past Medical History:  Diagnosis Date  . ASCVD (arteriosclerotic cardiovascular disease)   . Chronic pulmonary hypertension ([place])   . Diabetes mellitus without complication ([place])   . GERD (gastroesophageal reflux disease)   . Heart attack ([place]) 1989  . Heart disease   . Hyperlipidemia   . Hypertension   . IDA (iron deficiency anemia)   . Moderate mitral insufficiency   . Moderate  tricuspid insufficiency   . OA (osteoarthritis)   . Paroxysmal A-fib ([place])   . Thrombocytosis ([place])   . Tremor        Family History  Problem Relation Age of Onset  . Heart attack Father     Social History:  reports that he quit smoking about 23 years ago. His smoking use included cigarettes. He has a 20.00 pack-year smoking history. He has never used smokeless tobacco. He reports previous alcohol use of about 50.0 standard drinks of alcohol per week. He reports previous drug use.   Allergies:  Allergies  Allergen Reactions  . Aspirin Other (See Comments)    GI Bleed  . Dextrose Other (See Comments)    PATIENT CAN NOT TAKE BLOOD THINNERS OF ANY TYPE     Medications Prior to Admission  Medication Sig Dispense Refill  . atenolol (TENORMIN) 25 MG tablet Take 1 tablet (25 mg total) by mouth 2 (two) times daily. 30 tablet 1  . cyanocobalamin 1000 MCG tablet Take 1,000 mcg by mouth daily at 6 (six) AM.    . docusate sodium (COLACE) 100 MG capsule Take 1 capsule (100 mg total) by mouth 2 (two) times daily. 10 capsule 0  . folic acid (FOLVITE) 1 MG tablet TAKE 1 TABLET BY MOUTH ONCE DAILY (Patient taking differently: Take 1 mg by mouth daily. ) 90 tablet 1  . furosemide (LASIX) 40 MG tablet Take 1 tablet (40 mg total) by mouth 2 (two) times daily. 30 tablet 1  . JANUMET 50-1000 MG tablet Take 1 tablet by mouth at bedtime.    . magnesium oxide (MAG-OX)  400 MG tablet Take 400 mg by mouth daily.    . melatonin 5 MG TABS Take 0.5 tablets (2.5 mg total) by mouth at bedtime. 30 tablet 0  . Multiple Vitamin (MULTIVITAMIN WITH MINERALS) TABS tablet Take 1 tablet by mouth daily.    . pantoprazole (PROTONIX) 40 MG tablet Take 1 tablet (40 mg total) by mouth daily. 30 tablet 1  . potassium chloride SA (K-DUR,KLOR-CON) 20 MEQ tablet Take 1 tablet (20 mEq total) by mouth daily. 30 tablet 0  . senna-docusate (SENOKOT-S) 8.6-50 MG tablet Take 1 tablet by mouth at bedtime as needed for mild  constipation. 30 tablet 0  . simvastatin (ZOCOR) 40 MG tablet Take 40 mg by mouth daily.    Marland Kitchen spironolactone (ALDACTONE) 50 MG tablet Take 50 mg by mouth daily.    . SYMBICORT 160-4.5 MCG/ACT inhaler Inhale 2 puffs into the lungs 2 (two) times daily.    Marland Kitchen thiamine 100 MG tablet Take 1 tablet (100 mg total) by mouth daily. 30 tablet   . torsemide (DEMADEX) 20 MG tablet Take 20 mg by mouth 2 (two) times daily.    Marland Kitchen acetaminophen (TYLENOL) 500 MG tablet Take 500 mg by mouth every 8 (eight) hours as needed.    . magnesium oxide (MAG-OX) 400 (241.3 Mg) MG tablet Take 1 tablet (400 mg total) by mouth daily. (Patient not taking: Reported on 06/20/2020) 7 tablet 0  . nabumetone (RELAFEN) 500 MG tablet Take 500 mg by mouth at bedtime. (Patient not taking: Reported on 06/20/2020)    . protein supplement shake (PREMIER PROTEIN) LIQD Take 325 mLs (11 oz total) by mouth 2 (two) times daily between meals. (Patient not taking: Reported on 04/28/2019) 60 Can 0       YQM:GNOIB from the symptoms mentioned above,there are no other symptoms referable to all systems reviewed.  Marland Kitchen atenolol  12.5 mg Oral Daily  . fluticasone furoate-vilanterol  1 puff Inhalation Daily  . furosemide  20 mg Intravenous Q12H  . insulin aspart  0-15 Units Subcutaneous TID WC  . insulin aspart  0-5 Units Subcutaneous QHS  . linagliptin  5 mg Oral Daily  . melatonin  2.5 mg Oral QHS  . sodium chloride flush  3 mL Intravenous Q12H  . spironolactone  50 mg Oral Daily         BCW:UGQBV from the symptoms mentioned above,there are no other symptoms referable to all systems reviewed.  Physical Exam: Vital signs in last 24 hours: Temp:  [97.7 F (36.5 C)-98.2 F (36.8 C)] 97.7 F (36.5 C) (08/28 1135) Pulse Rate:  [50-68] 68 (08/28 1135) Resp:  [18] 18 (08/28 1135) BP: (115-125)/(42-52) 119/42 (08/28 1135) SpO2:  [95 %-99 %] 98 % (08/28 1135) Weight:  [110.4 kg] 110.4 kg (08/28 0450) Weight change: -0.822 kg Last BM Date:  06/21/20  Intake/Output from previous day: 08/27 0701 - 08/28 0700 In: 1080 [P.O.:1080] Out: 6945 [Urine:1650] Total I/O In: 240 [P.O.:240] Out: 1200 [Urine:1200]   Physical Exam: General- pt is awake,alert, oriented to time place and person Resp- No acute REsp distress,  Rhonchi present CVS- S1S2 regular in rate and rhythm GIT- BS+, soft, NT, ND EXT-1+ LE Edema, Cyanosis CNS- CN 2-12 grossly intact. Moving all 4 extremities Psych- normal mood and affect   Lab Results: CBC Recent Labs    06/22/20 0450 06/22/20 0450 06/23/20 0621 06/24/20 0610  WBC 5.0  --  5.4  --   HGB 8.0*   < > 8.1* 8.4*  HCT  24.3*   < > 25.3* 25.1*  PLT 65*  --  70*  --    < > = values in this interval not displayed.    BMET Recent Labs    06/23/20 0621 06/24/20 0610  NA 141 138  K 5.1 5.1  CL 105 103  CO2 26 27  GLUCOSE 90 92  BUN 50* 50*  CREATININE 2.22* 1.97*  CALCIUM 8.8* 8.5*   Creatinine trend 2021 1.6==>2.2==>2.0 1.8--2.2 in July admission 2020 1.4--1.6   MICRO Recent Results (from the past 240 hour(s))  SARS Coronavirus 2 by RT PCR (hospital order, performed in [facility] hospital lab) Nasopharyngeal Nasopharyngeal Swab     Status: None   Collection Time: 06/21/20  8:18 AM   Specimen: Nasopharyngeal Swab  Result Value Ref Range Status   SARS Coronavirus 2 NEGATIVE NEGATIVE Final    Comment: (NOTE) SARS-CoV-2 target nucleic acids are NOT DETECTED.  The SARS-CoV-2 RNA is generally detectable in upper and lower respiratory specimens during the acute phase of infection. The lowest concentration of SARS-CoV-2 viral copies this assay can detect is 250 copies / mL. A negative result does not preclude SARS-CoV-2 infection and should not be used as the sole basis for treatment or other patient management decisions.  A negative result may occur with improper specimen collection / handling, submission of specimen other than nasopharyngeal swab, presence of viral mutation(s)  within the areas targeted by this assay, and inadequate number of viral copies (<250 copies / mL). A negative result must be combined with clinical observations, patient history, and epidemiological information.  Fact Sheet for Patients:   StrictlyIdeas.no  Fact Sheet for Healthcare Providers: BankingDealers.co.za  This test is not yet approved or  cleared by the [place] FDA and has been authorized for detection and/or diagnosis of SARS-CoV-2 by FDA under an Emergency Use Authorization (EUA).  This EUA will remain in effect (meaning this test can be used) for the duration of the COVID-19 declaration under Section 564(b)(1) of the Act, 21 U.S.C. section 360bbb-3(b)(1), unless the authorization is terminated or revoked sooner.  Performed at [facility], [address] [place], [place] [address]       Lab Results  Component Value Date   CALCIUM 8.5 (L) 06/24/2020   PHOS 4.7 (H) 06/24/2020    2D echo done on May 06, 2020 1. Left ventricular ejection fraction, by estimation, is 40 to 45%. The  left ventricle has mildly decreased function. The left ventricle  demonstrates global hypokinesis. The left ventricular internal cavity size  was severely dilated. There is mild  concentric left ventricular hypertrophy. Left ventricular diastolic  parameters are consistent with Grade II diastolic dysfunction  (pseudonormalization).     Impression: 1)Renal  AKI secondary to ATN Patient has AKI on CKD Patient has CKD stage IIIb Patient has CKD stage III since 2020 Patient has CKD most likely secondary to multiple factors Patient has longstanding history diabetes mellitus There is possible contribution from multiple factors Hypertension Age associated Caryl Comes as patient is 74 years old Post renal as patient does have history of nephrolithiasis going back to 2008 when patient had hydronephrosis    2)HTN Blood pressure  is at goal   3)Anemia of blood loss HGb stable  Patient has history of GI bleed  Results for MAHMUD, KEITHLY (MRN [id number]) as of 06/24/2020 15:45  Ref. Range 05/05/2020 14:05  Iron Latest Ref Range: 45 - 182 ug/dL 21 (L)  UIBC Latest Units: ug/dL 416  TIBC Latest Ref Range: 250 - 450 ug/dL 437  Saturation Ratios Latest Ref Range: 17.9 - 39.5 % 5 (L)  Ferritin Latest Ref Range: 24 - 336 ng/mL 22 (L)     4) secondary hyperparathyroidism CKD Mineral-Bone Disorder  Secondary Hyperparathyroidism present Phosphorus on the higher side but at goal for the acute stage No need for binders  5) acute on chronic combined systolic and diastolic CHF We will continue the current diuresis Patient is currently nearly 4 L negative  6) electrolytes Normokalemic NOrmonatremic   7)Acid base Co2 at goal     Plan:  Agree with IV diuretics.  Lasix 40 mg IV once daily  We will ask for renal ultrasound.  We will ask for post void residual  We will ask for chest x-ray to help assess with the fluid status.  Patient has history of iron deficiency anemia-we will ask for anemia profile this morning   We will continue to follow Chem-7     Suttyn Cryder s [facility] 06/24/2020, 12:56 PM

## 2020-06-24 NOTE — Progress Notes (Signed)
[facility] Cardiology  SUBJECTIVE: Patient overall feels slightly better than admission.  The patient continues to have chronic kidney disease with acute exacerbation and acute on chronic systolic diastolic dysfunction congestive heart failure with atrial fibrillation and controlled heart rate.  The patient has had recurrent exacerbation of the same thing which may be due to some noncompliance as well.  Vitals:   06/23/20 1611 06/23/20 2005 06/24/20 0450 06/24/20 0734  BP: (!) 118/50 (!) 125/51 (!) 115/50 (!) 117/52  Pulse: (!) 54 (!) 50 60 (!) 53  Resp:    18  Temp: 97.8 F (36.6 C) 98.2 F (36.8 C) 97.9 F (36.6 C) 98.1 F (36.7 C)  TempSrc: Oral Oral Oral Oral  SpO2: 99% 98% 96% 95%  Weight:   110.4 kg   Height:         Intake/Output Summary (Last 24 hours) at 06/24/2020 1008 Last data filed at 06/24/2020 9622 Gross per 24 hour  Intake 1080 ml  Output 2250 ml  Net -1170 ml      PHYSICAL EXAM  General: Well developed, well nourished, in no acute distress HEENT:  Normocephalic and atramatic Neck:  No JVD.  Lungs: Bibasilar decreased breath sounds with crackles bilaterally to auscultation and percussion. Heart: HRRR . Normal S1 and S2 without gallops or murmurs.  Abdomen: Bowel sounds are positive, abdomen soft and non-tender  Msk:  Back normal, normal gait. Normal strength and tone for age. Extremities: No clubbing, cyanosis 1-2+ lower extremity edema Neuro: Alert and oriented X 3. Psych:  Good affect, responds appropriately   LABS: Basic Metabolic Panel: Recent Labs    06/22/20 0450 06/23/20 0621  NA 139 141  K 4.6 5.1  CL 105 105  CO2 27 26  GLUCOSE 88 90  BUN 44* 50*  CREATININE 2.18* 2.22*  CALCIUM 8.4* 8.8*  MG 1.9  --   PHOS 5.3*  --    Liver Function Tests: Recent Labs    06/22/20 0450  AST 25  ALT 13  ALKPHOS 58  BILITOT 1.3*  PROT 7.0  ALBUMIN 3.1*   No results for input(s): LIPASE, AMYLASE in the last 72 hours. CBC: Recent Labs     06/22/20 0450 06/22/20 0450 06/23/20 0621 06/24/20 0610  WBC 5.0  --  5.4  --   HGB 8.0*   < > 8.1* 8.4*  HCT 24.3*   < > 25.3* 25.1*  MCV 89.3  --  91.7  --   PLT 65*  --  70*  --    < > = values in this interval not displayed.   Cardiac Enzymes: No results for input(s): CKTOTAL, CKMB, CKMBINDEX, TROPONINI in the last 72 hours. BNP: Invalid input(s): POCBNP D-Dimer: No results for input(s): DDIMER in the last 72 hours. Hemoglobin A1C: No results for input(s): HGBA1C in the last 72 hours. Fasting Lipid Panel: No results for input(s): CHOL, HDL, LDLCALC, TRIG, CHOLHDL, LDLDIRECT in the last 72 hours. Thyroid Function Tests: No results for input(s): TSH, T4TOTAL, T3FREE, THYROIDAB in the last 72 hours.  Invalid input(s): FREET3 Anemia Panel: No results for input(s): VITAMINB12, FOLATE, FERRITIN, TIBC, IRON, RETICCTPCT in the last 72 hours.  No results found.   Echo LVEF 40 to 45% by 2D echocardiogram 05/06/2020  TELEMETRY: Atrial fibrillation at a rate of 54 bpm:  ASSESSMENT AND PLAN:  Principal Problem:   Acute on chronic combined systolic and diastolic CHF (congestive heart failure) (HCC) Active Problems:   Diabetes (HCC)   Thrombocytopenia ([place])  Chronic kidney disease, stage 3a   Iron deficiency anemia due to chronic blood loss   Chronic a-fib (HCC)   Benign essential HTN   Moderate aortic stenosis   History of GI bleed   Acute CHF (congestive heart failure) (HCC)   Acute on chronic diastolic CHF (congestive heart failure) ([place])    1. Acute on chronic combined systolic and diastolic CHF, presenting withseveral month history ofprogressive exertional dyspnea and lower extremity edema with chest x-ray revealing pulmonary venous congestion and trace bilateral pleural effusions with BNP greater than 700.Initial diuresis and clinical improvement with IV Lasix. 2D echocardiogramon 05/06/2020 revealed mildlydecreasedleft ventricular function with LVEF 40 to 45%  with severely dilated left ventricle, moderately elevated pulmonary artery systolicpressure, severe biatrial enlargement, trivial pericardial effusion, mild mitral,aortic, and tricuspid regurgitation, and mild to moderate aortic valve sclerosis/calcification without evidence for significant aortic stenosis with aortic valve mean gradient 8.0 mmHg, peak gradient 15.2 mmHg, with a calculated aortic valve area 1.35 cm.  2.Chronic atrial fibrillation, not on chronic anticoagulation due to recent history of GI bleed, requiring blood transfusions, and history of thrombocytopenia.Current ventricular rate 54 bpm.  Atenolol dose decreased to 12.5 mg daily.  And will continue at this time  3.Bradycardia,with atrial fibrillation at a rate of 54 bpm on lower dose atenolol 12.5 mg daily.  But stable at this time  4.Anemia with recent history of GI bleed, hemoglobin 8.0.GI following  5. Mild dilated cardiomyopathywith LVEF 40 to 45%, on atenolol; lisinopril initiated, then held due to worsening renal function and hypotension.  6.Coronary artery disease, status post MI and coronary stentin 1990s; recent [place] in 02/2020 with normal LV function without evidence of scar or ischemia.Patient denies chest pain.   7.Hypertension,  blood pressure well controlled on current BP medications which include spironolactone 50 mg,atenolol 12.5 mg, and IV Lasix 20 mg BID. Blood pressure dropped to 80 systolic after receiving oxycodone.  8. AKI on CKD,  BUN and creatinine were 50 and 2.22, respectively and will follow  Recommendations  1.  Agree with overall current therapy with no need and change at this time 2.  Continue diuresis with lower dose furosemide 20 mg IV twice daily 3.  Carefully monitor renal status and will consider Lasix drip if necessary 4.  Defer chronic anticoagulation with history of recent GI bleed and thrombocytopenia 5.  Pain management per hospitalist 6.  Consider  nephrology consult    Corey Skains, MD, PhD, [facility] 06/24/2020 10:08 AM

## 2020-06-24 NOTE — Progress Notes (Signed)
PROGRESS NOTE    Patrick [name]  FGH:[id number] DOB: [birth year] DOA: 06/20/2020 PCP: Idelle Crouch, MD   Brief Narrative:  Patrick [name] is a 74 y.o. M with hx. DM, HTN, A. Fib not on anticoagulation due to thrombocytopenia and history of GI bleed requiring transfusion from suspected AVM, CKD IIIa, combined CHF, moderate aortic stenosis and cirrhosis who presented with one week of worsening shortness of breath and severe leg swelling. In the ER, chest x-ray showed congestion, legs were severely swollen.  He was started on IV diuretics and admitted to the hospitalist service.  He is admitted for acute on chronic systolic and diastolic CHF. He reports feeling better, cardiology recommended reduce the dose of atenolol and laix due to worsening renal function.  Patient is making slow improvement,  nephrology consulted due to the worsening renal functions.    Assessment & Plan:   Principal Problem:   Acute on chronic combined systolic and diastolic CHF (congestive heart failure) (HCC) Active Problems:   Diabetes (HCC)   Thrombocytopenia (HCC)   Chronic kidney disease, stage 3a   Iron deficiency anemia due to chronic blood loss   Chronic a-fib (HCC)   Benign essential HTN   Moderate aortic stenosis   History of GI bleed   Acute CHF (congestive heart failure) (HCC)   Acute on chronic diastolic CHF (congestive heart failure) (HCC)   Acute on Chronic systolic and diastolic CHF Moderate aortic stenosis Admitted and started on Lasix 40 IV, prompt urine output so far, about 1L.  EF 40-45% last month.  This is new reduced EF last month, evaluated by primary Cardiologist at that time, deferred GDMT or ischemic work up.   -Reduce  Furosemide 20 mg IV twice a day. -K supplement -Strict I/Os, daily weights, telemetry  -Daily monitoring renal function, renal functions started to trend down. -hold low dose lisinopril for HFrEF and monitor BP and Creatinine. -Continue atenolol but at  lower dose due to bradycardia and low BP. -Reports feeling better, still has significant edema.    Iron deficiency anemia due to chronic blood loss. History of GI bleed with AVM Hgb dropped to 8.3 today.  No clinical bleeding at this point to warrant GI evaluation.  Known AVMs.   Did require 2 units transfusion last hospital stay. -Hgb remained stable above 8. -Transfusion threashold 8 g/dL given HF  Thrombocytopenia - stable  Alcoholic liver cirrhosis - stable  Diabetes Glucose controlled -Continue SS corrections -COntinue DPPIV -Hold metformin  Chronic kidney disease IIIa Cr is near basleine with eGFR high 40s. Serum creatinine trending up because of Lasix, with reduced Lasix to 20 mg twice daily, renal functions started to show improvement. Monitor renal functions.  Nephrology consulted will follow up recommendation.  Chronic atrial fibrillation Not anticoagulation candidate due to chronic GI bleeding from AVMs and thrombocytopenia. HR controlled/low. -Continue atenolol  Hypertension -Continue atenolol, spironolactone and start lisinopril   COPD -Continue Symbicort as formulary alternative     DVT prophylaxis: SCDs Code Status: Full  Family Communication: No one at bedside, discussed with patient in detail. Disposition Plan:  Dispo: The patient is from: Home  Anticipated d/c is to: Home  Anticipated d/c date is: 3 days  Patient currently is not medically stable to d/c.   Consultants:   Procedures:  Antimicrobials:  Anti-infectives (From admission, onward)   None      Subjective: Patient was seen and examined at bedside.  No overnight events, Patient became severely short of breath in the  morning,  found to have crackles and wheezes on the exam.  Patient was given Lasix and DuoNeb which improved his condition.  He feels better afterwards. Objective: Vitals:   06/23/20 2005 06/24/20 0450 06/24/20 0734  06/24/20 1135  BP: (!) 125/51 (!) 115/50 (!) 117/52 (!) 119/42  Pulse: (!) 50 60 (!) 53 68  Resp:   18 18  Temp: 98.2 F (36.8 C) 97.9 F (36.6 C) 98.1 F (36.7 C) 97.7 F (36.5 C)  TempSrc: Oral Oral Oral Oral  SpO2: 98% 96% 95% 98%  Weight:  110.4 kg    Height:        Intake/Output Summary (Last 24 hours) at 06/24/2020 1337 Last data filed at 06/24/2020 1148 Gross per 24 hour  Intake 840 ml  Output 2150 ml  Net -1310 ml   Filed Weights   06/22/20 0501 06/23/20 0430 06/24/20 0450  Weight: 111.2 kg 111.2 kg 110.4 kg    Examination:  General exam: Appears calm and comfortable  Respiratory system: Clear to auscultation. Respiratory effort normal. Cardiovascular system: S1 & S2 heard, RRR. No JVD, murmurs, rubs, gallops or clicks. No pedal edema. Gastrointestinal system: Abdomen is nondistended, soft and nontender. No organomegaly or masses felt. Normal bowel sounds heard. Central nervous system: Alert and oriented. No focal neurological deficits. Extremities:  Bilateral Leg edema. Skin: No rashes, lesions or ulcers Psychiatry: Judgement and insight appear normal. Mood & affect appropriate.     Data Reviewed: I have personally reviewed following labs and imaging studies  CBC: Recent Labs  Lab 06/19/20 1610 06/19/20 1610 06/21/20 0445 06/21/20 0940 06/22/20 0450 06/23/20 0621 06/24/20 0610  WBC 5.3  --  5.3  --  5.0 5.4  --   HGB 8.2*   < > 8.0* 8.0* 8.0* 8.1* 8.4*  HCT 25.9*   < > 25.0* 25.3* 24.3* 25.3* 25.1*  MCV 91.2  --  92.6  --  89.3 91.7  --   PLT 76*  --  68*  --  65* 70*  --    < > = values in this interval not displayed.   Basic Metabolic Panel: Recent Labs  Lab 06/19/20 1610 06/21/20 0445 06/22/20 0450 06/23/20 0621 06/24/20 0610  NA 138 138 139 141 138  K 4.3 4.5 4.6 5.1 5.1  CL 107 104 105 105 103  CO2 '22 26 27 26 27  ' GLUCOSE 104* 97 88 90 92  BUN 37* 39* 44* 50* 50*  CREATININE 1.60* 1.64* 2.18* 2.22* 1.97*  CALCIUM 8.7* 8.6* 8.4*  8.8* 8.5*  MG  --   --  1.9  --  2.1  PHOS  --   --  5.3*  --  4.7*   GFR: Estimated Creatinine Clearance: 40.3 mL/min (A) (by C-G formula based on SCr of 1.97 mg/dL (H)). Liver Function Tests: Recent Labs  Lab 06/22/20 0450  AST 25  ALT 13  ALKPHOS 58  BILITOT 1.3*  PROT 7.0  ALBUMIN 3.1*   No results for input(s): LIPASE, AMYLASE in the last 168 hours. No results for input(s): AMMONIA in the last 168 hours. Coagulation Profile: No results for input(s): INR, PROTIME in the last 168 hours. Cardiac Enzymes: No results for input(s): CKTOTAL, CKMB, CKMBINDEX, TROPONINI in the last 168 hours. BNP (last 3 results) No results for input(s): PROBNP in the last 8760 hours. HbA1C: No results for input(s): HGBA1C in the last 72 hours. CBG: Recent Labs  Lab 06/23/20 1139 06/23/20 1608 06/23/20 2053 06/24/20 0734 06/24/20 1132  GLUCAP 108* 85 96 91 100*   Lipid Profile: No results for input(s): CHOL, HDL, LDLCALC, TRIG, CHOLHDL, LDLDIRECT in the last 72 hours. Thyroid Function Tests: No results for input(s): TSH, T4TOTAL, FREET4, T3FREE, THYROIDAB in the last 72 hours. Anemia Panel: No results for input(s): VITAMINB12, FOLATE, FERRITIN, TIBC, IRON, RETICCTPCT in the last 72 hours. Sepsis Labs: No results for input(s): PROCALCITON, LATICACIDVEN in the last 168 hours.  Recent Results (from the past 240 hour(s))  SARS Coronavirus 2 by RT PCR (hospital order, performed in [facility] hospital lab) Nasopharyngeal Nasopharyngeal Swab     Status: None   Collection Time: 06/21/20  8:18 AM   Specimen: Nasopharyngeal Swab  Result Value Ref Range Status   SARS Coronavirus 2 NEGATIVE NEGATIVE Final    Comment: (NOTE) SARS-CoV-2 target nucleic acids are NOT DETECTED.  The SARS-CoV-2 RNA is generally detectable in upper and lower respiratory specimens during the acute phase of infection. The lowest concentration of SARS-CoV-2 viral copies this assay can detect is 250 copies / mL. A  negative result does not preclude SARS-CoV-2 infection and should not be used as the sole basis for treatment or other patient management decisions.  A negative result may occur with improper specimen collection / handling, submission of specimen other than nasopharyngeal swab, presence of viral mutation(s) within the areas targeted by this assay, and inadequate number of viral copies (<250 copies / mL). A negative result must be combined with clinical observations, patient history, and epidemiological information.  Fact Sheet for Patients:   StrictlyIdeas.no  Fact Sheet for Healthcare Providers: BankingDealers.co.za  This test is not yet approved or  cleared by the [place] FDA and has been authorized for detection and/or diagnosis of SARS-CoV-2 by FDA under an Emergency Use Authorization (EUA).  This EUA will remain in effect (meaning this test can be used) for the duration of the COVID-19 declaration under Section 564(b)(1) of the Act, 21 U.S.C. section 360bbb-3(b)(1), unless the authorization is terminated or revoked sooner.  Performed at [facility], [address] [place], [place] [address]     Radiology Studies: No results found.  Scheduled Meds: . atenolol  12.5 mg Oral Daily  . fluticasone furoate-vilanterol  1 puff Inhalation Daily  . furosemide  20 mg Intravenous Q12H  . insulin aspart  0-15 Units Subcutaneous TID WC  . insulin aspart  0-5 Units Subcutaneous QHS  . linagliptin  5 mg Oral Daily  . melatonin  2.5 mg Oral QHS  . sodium chloride flush  3 mL Intravenous Q12H  . spironolactone  50 mg Oral Daily   Continuous Infusions: . sodium chloride       LOS: 4 days    Time spent: 25 mins    Shawna Clamp, MD [facility]   If 7PM-7AM, please contact night-coverage

## 2020-06-25 LAB — GLUCOSE, CAPILLARY
Glucose-Capillary: 93 mg/dL (ref 70–99)
Glucose-Capillary: 102 mg/dL — ABNORMAL HIGH (ref 70–99)
Glucose-Capillary: 104 mg/dL — ABNORMAL HIGH (ref 70–99)
Glucose-Capillary: 94 mg/dL (ref 70–99)

## 2020-06-25 LAB — CBC
HCT: 23.8 % — ABNORMAL LOW (ref 39.0–52.0)
Hemoglobin: 7.8 g/dL — ABNORMAL LOW (ref 13.0–17.0)
MCH: 29.4 pg (ref 26.0–34.0)
MCHC: 32.8 g/dL (ref 30.0–36.0)
MCV: 89.8 fL (ref 80.0–100.0)
Platelets: 46 10*3/uL — ABNORMAL LOW (ref 150–400)
RBC: 2.65 MIL/uL — ABNORMAL LOW (ref 4.22–5.81)
RDW: 18.3 % — ABNORMAL HIGH (ref 11.5–15.5)
WBC: 2.4 10*3/uL — ABNORMAL LOW (ref 4.0–10.5)
nRBC: 0 % (ref 0.0–0.2)

## 2020-06-25 LAB — BASIC METABOLIC PANEL
Anion gap: 6 (ref 5–15)
BUN: 41 mg/dL — ABNORMAL HIGH (ref 8–23)
CO2: 29 mmol/L (ref 22–32)
Calcium: 8.6 mg/dL — ABNORMAL LOW (ref 8.9–10.3)
Chloride: 98 mmol/L (ref 98–111)
Creatinine, Ser: 1.75 mg/dL — ABNORMAL HIGH (ref 0.61–1.24)
GFR calc Af Amer: 43 mL/min — ABNORMAL LOW (ref >60)
GFR calc non Af Amer: 38 mL/min — ABNORMAL LOW (ref >60)
Glucose, Bld: 92 mg/dL (ref 70–99)
Potassium: 4.2 mmol/L (ref 3.5–5.1)
Sodium: 133 mmol/L — ABNORMAL LOW (ref 135–145)

## 2020-06-25 MED ORDER — DARBEPOETIN ALFA 25 MCG/0.42ML IJ SOSY: 25.0000 ug | PREFILLED_SYRINGE | INTRAMUSCULAR | Status: DC

## 2020-06-25 MED ORDER — EPOETIN ALFA 4000 UNIT/ML IJ SOLN
3000.0000 [IU] | INTRAMUSCULAR | Status: DC
  Administered 2020-06-25: 3000 [IU] via SUBCUTANEOUS
  Filled 2020-06-25: qty 1

## 2020-06-25 NOTE — Evaluation (Signed)
Occupational Therapy Evaluation Patient Details Name: Patrick [name] MRN: [id number] DOB: July 21, 1946 Today's Date: 06/25/2020    History of Present Illness Patrick [name] is a 52yoM who comes to [facility] on 8/23 after progressive LEE, SOB, and DOE. Pt admitted in CHF exacerbation, also noted to have progressed anemia compared to 2 months ago whence he received 2u PRBC. PMH: DM, HTN, AF not on anticoag 2/2 GIB hx, CKD3a, combined CHF, AS, cirrhosis. At baseline pt is modified independent with basic ADL and household mobility.   Clinical Impression   Patrick [name] was seen for OT evaluation this date, limited to bed level 2/2 pt reporting fatigue. Prior to hospital admission, pt was MOD I for mobility and ADLs except required assist for LBD. Pt lives c wife in [facility]. Pt presents to acute OT demonstrating impaired ADL performance and functional mobility 2/2 functional strength deficits, decreased initiation, and decreased activity toelrance. Pt currently requires MOD I self-drinking/feeding at bed level. MIN A for LBD at bed level. Pt would benefit from skilled OT to address noted impairments and functional limitations (see below for any additional details) in order to maximize safety and independence while minimizing falls risk and caregiver burden. Upon hospital discharge, recommend HHOT to maximize pt safety and return to functional independence during meaningful occupations of daily life.     Follow Up Recommendations  Home health OT    Equipment Recommendations  3 in 1 bedside commode    Recommendations for Other Services       Precautions / Restrictions Precautions Precautions: Fall Restrictions Weight Bearing Restrictions: No      Mobility Bed Mobility       General bed mobility comments: Pt refuses staing recently returned to bed from chair  Transfers   Not tested    ADL either performed or assessed with clinical judgement   ADL Overall ADL's : Needs  assistance/impaired      General ADL Comments: MOD I self-drinking/feeding at bed level. MIN A for LBD at bed level.       Pertinent Vitals/Pain Pain Assessment: No/denies pain     Hand Dominance Right   Extremity/Trunk Assessment Upper Extremity Assessment Upper Extremity Assessment: Overall WFL for tasks assessed   Lower Extremity Assessment Lower Extremity Assessment: Overall WFL for tasks assessed       Communication Communication Communication: HOH   Cognition Arousal/Alertness: Awake/alert Behavior During Therapy: WFL for tasks assessed/performed Overall Cognitive Status: Within Functional Limits for tasks assessed      General Comments       Exercises Exercises: Other exercises Other Exercises Other Exercises: Pt educated re: OT role, DME recs, d/c recs, HEP (SLR, IS, overhead press) Other Exercises: IS, LBD, self-drinking   Shoulder Instructions      Home Living Family/patient expects to be discharged to:: Private residence Living Arrangements: Spouse/significant other Available Help at Discharge: Family;Available 24 hours/day;Available PRN/intermittently Type of Home: House Home Access: Stairs to enter [employer] of Steps: 3 Entrance Stairs-Rails: Can reach both Home Layout: One level     Bathroom Shower/Tub: Teacher, early years/pre: Standard     Home Equipment: Toilet riser;Cane - single point;Walker - 2 wheels   Additional Comments: Pt reports sponge bathing at sink       Prior Functioning/Environment Level of Independence: Needs assistance        Comments: wife helps c LB dressing, otherwise, pt independent with ADL performance; denies any falls/close cals in last 6 months ; uses a RW at all  times.        OT Problem List: Decreased activity tolerance;Decreased safety awareness      OT Treatment/Interventions: Self-care/ADL training;Therapeutic exercise;Energy conservation;DME and/or AE instruction;Therapeutic  activities;Patient/family education;Balance training    OT Goals(Current goals can be found in the care plan section) Acute Rehab OT Goals Patient Stated Goal: return home OT Goal Formulation: With patient Time For Goal Achievement: 07/09/20 Potential to Achieve Goals: Good ADL Goals Pt Will Perform Grooming: with supervision;standing (c LRAD PRN) Pt Will Perform Upper Body Dressing: sitting;with modified independence Pt Will Transfer to Toilet: with supervision;stand pivot transfer;bedside commode (c LRAD PRN) Pt Will Perform Toileting - Clothing Manipulation and hygiene: with modified independence;sitting/lateral leans  OT Frequency: Min 1X/week   Barriers to D/C: Inaccessible home environment          AM-PAC OT "6 Clicks" Daily Activity     Outcome Measure Help from another person eating meals?: None Help from another person taking care of personal grooming?: None Help from another person toileting, which includes using toliet, bedpan, or urinal?: A Little Help from another person bathing (including washing, rinsing, drying)?: A Little Help from another person to put on and taking off regular upper body clothing?: A Little Help from another person to put on and taking off regular lower body clothing?: A Little 6 Click Score: 20   End of Session    Activity Tolerance: Patient limited by fatigue Patient left: in bed;with call bell/phone within reach;with bed alarm set;with nursing/sitter in room  OT Visit Diagnosis: Other abnormalities of gait and mobility (R26.89)                Time: [id number] OT Time Calculation (min): 7 min Charges:  OT General Charges $OT Visit: 1 Visit OT Evaluation $OT Eval Low Complexity: 1 Low  Dessie Coma, M.S. OTR/L  06/25/20, 1:14 PM  ascom [phone]

## 2020-06-25 NOTE — Plan of Care (Signed)
  Problem: Education: Goal: Knowledge of General Education information will improve Description: Including pain rating scale, medication(s)/side effects and non-pharmacologic comfort measures Outcome: Progressing    Patient declined to ambulate this shift. Patient has agreed to ambulate in the morning.

## 2020-06-25 NOTE — Progress Notes (Signed)
Patrick [name]  MRN: [id number]  DOB/AGE: 1946-10-06 74 y.o.  Primary Care Physician:[name], Patrick Douglas, MD  Admit date: 06/20/2020  Chief Complaint:  Chief Complaint  Patient presents with  . Shortness of Breath  . Leg Swelling    S-Pt presented on  06/20/2020 with  Chief Complaint  Patient presents with  . Shortness of Breath  . Leg Swelling  . Patient main complaint in today visit was I am feeling better.  I was able to walk up to the bathroom.  My breathing is much better than earlier.  Medications . atenolol  12.5 mg Oral Daily  . fluticasone furoate-vilanterol  1 puff Inhalation Daily  . furosemide  20 mg Intravenous Q12H  . insulin aspart  0-15 Units Subcutaneous TID WC  . insulin aspart  0-5 Units Subcutaneous QHS  . linagliptin  5 mg Oral Daily  . melatonin  2.5 mg Oral QHS  . sodium chloride flush  3 mL Intravenous Q12H  . spironolactone  50 mg Oral Daily         JSH:FWYOV from the symptoms mentioned above,there are no other symptoms referable to all systems reviewed.  Physical Exam: Vital signs in last 24 hours: Temp:  [98.1 F (36.7 C)-98.5 F (36.9 C)] 98.3 F (36.8 C) (08/29 1050) Pulse Rate:  [55-75] 55 (08/29 1050) Resp:  [18-19] 19 (08/29 1050) BP: (107-128)/(41-90) 118/47 (08/29 1050) SpO2:  [89 %-99 %] 98 % (08/29 1050) Weight:  [785 kg] 108 kg (08/29 0457) Weight change: -2.444 kg Last BM Date: 06/21/20  Intake/Output from previous day: 08/28 0701 - 08/29 0700 In: 480 [P.O.:480] Out: 4750 [Urine:4750] Total I/O In: 240 [P.O.:240] Out: -    Physical Exam: General- pt is awake,alert, oriented to time place and person Resp- No acute REsp distress, NO Rhonchi CVS- S1S2 regular in rate and rhythm GIT- BS+, soft, NT, ND EXT- 1+ LE Edema, Cyanosis   Lab Results: CBC Recent Labs    06/23/20 0621 06/23/20 0621 06/24/20 0610 06/25/20 0442  WBC 5.4  --   --  2.4*  HGB 8.1*   < > 8.4* 7.8*  HCT 25.3*   < > 25.1* 23.8*  PLT  70*  --   --  46*   < > = values in this interval not displayed.    BMET Recent Labs    06/24/20 0610 06/25/20 0442  NA 138 133*  K 5.1 4.2  CL 103 98  CO2 27 29  GLUCOSE 92 92  BUN 50* 41*  CREATININE 1.97* 1.75*  CALCIUM 8.5* 8.6*   Creatinine trend 2021 1.6==>2.2==>2.0==>175  1.8--2.2 in July admission 2020 1.4--1.6    MICRO Recent Results (from the past 240 hour(s))  SARS Coronavirus 2 by RT PCR (hospital order, performed in [facility] hospital lab) Nasopharyngeal Nasopharyngeal Swab     Status: None   Collection Time: 06/21/20  8:18 AM   Specimen: Nasopharyngeal Swab  Result Value Ref Range Status   SARS Coronavirus 2 NEGATIVE NEGATIVE Final    Comment: (NOTE) SARS-CoV-2 target nucleic acids are NOT DETECTED.  The SARS-CoV-2 RNA is generally detectable in upper and lower respiratory specimens during the acute phase of infection. The lowest concentration of SARS-CoV-2 viral copies this assay can detect is 250 copies / mL. A negative result does not preclude SARS-CoV-2 infection and should not be used as the sole basis for treatment or other patient management decisions.  A negative result may occur with improper specimen collection / handling, submission  of specimen other than nasopharyngeal swab, presence of viral mutation(s) within the areas targeted by this assay, and inadequate number of viral copies (<250 copies / mL). A negative result must be combined with clinical observations, patient history, and epidemiological information.  Fact Sheet for Patients:   StrictlyIdeas.no  Fact Sheet for Healthcare Providers: BankingDealers.co.za  This test is not yet approved or  cleared by the [place] FDA and has been authorized for detection and/or diagnosis of SARS-CoV-2 by FDA under an Emergency Use Authorization (EUA).  This EUA will remain in effect (meaning this test can be used) for the duration of  the COVID-19 declaration under Section 564(b)(1) of the Act, 21 U.S.C. section 360bbb-3(b)(1), unless the authorization is terminated or revoked sooner.  Performed at [facility], [address] [place], [place] [address]       Lab Results  Component Value Date   CALCIUM 8.6 (L) 06/25/2020   PHOS 4.7 (H) 06/24/2020               Impression: 1)Renal  AKI secondary to ATN Patient has AKI on CKD Patient has CKD stage IIIb Patient has CKD stage III since 2020 Patient has CKD most likely secondary to multiple factors Patient has longstanding history diabetes mellitus There is possible contribution from multiple factors Hypertension Age associated decline  as patient is 74 years old Post renal as patient does have history of nephrolithiasis going back to 2008 when patient had hydronephrosis    2)HTN Blood pressure is at goal   2)HTN  Blood pressure stable  3)Anemia of chronic disease  HGb is not at goal (9--11) Results for Patrick [name] (MRN [id number]) as of 06/25/2020 12:34  Ref. Range 06/24/2020 06:10  Iron Latest Ref Range: 45 - 182 ug/dL 83  UIBC Latest Units: ug/dL 302  TIBC Latest Ref Range: 250 - 450 ug/dL 385  Saturation Ratios Latest Ref Range: 17.9 - 39.5 % 22  Ferritin Latest Ref Range: 24 - 336 ng/mL 69  Folate Latest Ref Range: >5.9 ng/mL 32.0    Patient will benefit from Epogen/ESA  4) secondary hyperparathyroidism CKD Mineral-Bone Disorder  Secondary Hyperparathyroidism present Phosphorus on the higher side but at goal for the acute stage No need for binders  5) acute on chronic combined systolic and diastolic CHF We will continue the current diuresis Patient is currently nearly 6 L negative  6) electrolytes  Normokalemic  Hyponatremic   7)Acid base Co2 at goal   8) thrombocytopenia Patient platelets count are trending down Primary team is following I discussed with primary team about possibly  needing hematology help as an outpatient  Plan:  Agree with the current diuretic regimen of 20 mg IV twice daily Patient responding well Patient is 6 L negative Will suggest to change diuretics to 40 mg p.o. twice daily We will start patient on darbepoetin     Patrick [name] s [facility] 06/25/2020, 12:34 PM

## 2020-06-25 NOTE — Progress Notes (Signed)
[facility]  SUBJECTIVE: Patient overall feels slightly better than yesterday and significantly better since admission.  The patient continues to have chronic kidney disease with acute exacerbation and acute on chronic systolic diastolic dysfunction congestive heart failure with atrial fibrillation and controlled heart rate.  The patient has had recurrent exacerbation of the same thing which may be due to some noncompliance as well.  But overall has done better since admission with current therapy  Vitals:   06/25/20 0457 06/25/20 0812 06/25/20 0828 06/25/20 1050  BP: 107/90 (!) 117/51  (!) 118/47  Pulse: (!) 55 65 75 (!) 55  Resp: 18 19  19   Temp: 98.2 F (36.8 C) 98.5 F (36.9 C)  98.3 F (36.8 C)  TempSrc: Oral Oral  Oral  SpO2: 93% 99% (!) 89% 98%  Weight: 108 kg     Height:         Intake/Output Summary (Last 24 hours) at 06/25/2020 1455 Last data filed at 06/25/2020 0950 Gross per 24 hour  Intake 240 ml  Output 3550 ml  Net -3310 ml      PHYSICAL EXAM  General: Well developed, well nourished, in no acute distress HEENT:  Normocephalic and atramatic Neck:  No JVD.  Lungs: Bibasilar decreased breath sounds with crackles bilaterally to auscultation and percussion. Heart: HRRR . Normal S1 and S2 without gallops or murmurs.  Abdomen: Bowel sounds are positive, abdomen soft and non-tender  Msk:  Back normal, normal gait. Normal strength and tone for age. Extremities: No clubbing, cyanosis 1-2+ lower extremity edema Neuro: Alert and oriented X 3. Psych:  Good affect, responds appropriately   LABS: Basic Metabolic Panel: Recent Labs    06/24/20 0610 06/25/20 0442  NA 138 133*  K 5.1 4.2  CL 103 98  CO2 27 29  GLUCOSE 92 92  BUN 50* 41*  CREATININE 1.97* 1.75*  CALCIUM 8.5* 8.6*  MG 2.1  --   PHOS 4.7*  --    Liver Function Tests: No results for input(s): AST, ALT, ALKPHOS, BILITOT, PROT, ALBUMIN in the last 72 hours. No results for input(s): LIPASE,  AMYLASE in the last 72 hours. CBC: Recent Labs    06/23/20 0621 06/23/20 0621 06/24/20 0610 06/25/20 0442  WBC 5.4  --   --  2.4*  HGB 8.1*   < > 8.4* 7.8*  HCT 25.3*   < > 25.1* 23.8*  MCV 91.7  --   --  89.8  PLT 70*  --   --  46*   < > = values in this interval not displayed.   Cardiac Enzymes: No results for input(s): CKTOTAL, CKMB, CKMBINDEX, TROPONINI in the last 72 hours. BNP: Invalid input(s): POCBNP D-Dimer: No results for input(s): DDIMER in the last 72 hours. Hemoglobin A1C: No results for input(s): HGBA1C in the last 72 hours. Fasting Lipid Panel: No results for input(s): CHOL, HDL, LDLCALC, TRIG, CHOLHDL, LDLDIRECT in the last 72 hours. Thyroid Function Tests: No results for input(s): TSH, T4TOTAL, T3FREE, THYROIDAB in the last 72 hours.  Invalid input(s): FREET3 Anemia Panel: Recent Labs    06/23/20 0621 06/24/20 0610 06/24/20 1548  VITAMINB12 1,019*  --   --   FOLATE  --  32.0  --   FERRITIN  --  69  --   TIBC  --  385  --   IRON  --  83  --   RETICCTPCT  --   --  1.6    DG Chest 1 View  Result  Date: 06/24/2020 CLINICAL DATA:  Congestive heart failure. EXAM: CHEST  1 VIEW COMPARISON:  June 19, 2020 FINDINGS: Bilateral pulmonary opacities, particularly in the bases have worsened in the interval. Stable cardiomegaly. The hila and mediastinum are unchanged. No pneumothorax. Probable left effusion. IMPRESSION: Increasing bibasilar pulmonary opacities may represent multifocal pneumonia versus asymmetric edema. Cardiomegaly and small left effusion. Electronically Signed   By: Dorise Bullion III M.D   On: 06/24/2020 16:13   US RENAL  Result Date: 06/24/2020 CLINICAL DATA:  ATN EXAM: RENAL / URINARY TRACT ULTRASOUND COMPLETE COMPARISON:  None. FINDINGS: Right Kidney: Renal measurements: 10.7 x 5.0 x 4.1 cm = volume: 115 mL. Echogenicity is increased. No mass or hydronephrosis visualized. Left Kidney: Renal measurements: 10.1 x 4.8 x 5.4 cm = volume: 137 mL.  Echogenicity is increased. No mass or hydronephrosis visualized. Bladder: Appears normal for degree of bladder distention. Other: None. IMPRESSION: Increased echogenicity of the bilateral kidneys as can be seen in medical renal disease. No hydronephrosis. Electronically Signed   By: Audie Pinto M.D.   On: 06/24/2020 16:09     Echo LVEF 40 to 45% by 2D echocardiogram 05/06/2020  TELEMETRY: Atrial fibrillation at a rate of 54 bpm:  ASSESSMENT AND PLAN:  Principal Problem:   Acute on chronic combined systolic and diastolic CHF (congestive heart failure) (HCC) Active Problems:   Diabetes (HCC)   Thrombocytopenia (HCC)   Chronic kidney disease, stage 3a   Iron deficiency anemia due to chronic blood loss   Chronic a-fib (HCC)   Benign essential HTN   Moderate aortic stenosis   History of GI bleed   Acute CHF (congestive heart failure) (HCC)   Acute on chronic diastolic CHF (congestive heart failure) ([place])    1. Acute on chronic combined systolic and diastolic CHF, presenting withseveral month history ofprogressive exertional dyspnea and lower extremity edema with chest x-ray revealing pulmonary venous congestion and trace bilateral pleural effusions with BNP greater than 700.Initial diuresis and clinical improvement with IV Lasix. 2D echocardiogramon 05/06/2020 revealed mildlydecreasedleft ventricular function with LVEF 40 to 45% with severely dilated left ventricle, moderately elevated pulmonary artery systolicpressure, severe biatrial enlargement, trivial pericardial effusion, mild mitral,aortic, and tricuspid regurgitation, and mild to moderate aortic valve sclerosis/calcification without evidence for significant aortic stenosis with aortic valve mean gradient 8.0 mmHg, peak gradient 15.2 mmHg, with a calculated aortic valve area 1.35 cm.  2.Chronic atrial fibrillation, not on chronic anticoagulation due to recent history of GI bleed, requiring blood transfusions, and history  of thrombocytopenia.Current ventricular rate 54 bpm.  Atenolol dose decreased to 12.5 mg daily.  And will continue at this time  3.Bradycardia,with atrial fibrillation at a rate of 54 bpm on lower dose atenolol 12.5 mg daily.  But stable at this time  4.Anemia with recent history of GI bleed, hemoglobin 8.0.GI following  5. Mild dilated cardiomyopathywith LVEF 40 to 45%, on atenolol; lisinopril initiated, then held due to worsening renal function and hypotension.  6.Coronary artery disease, status post MI and coronary stentin 1990s; recent [place] in 02/2020 with normal LV function without evidence of scar or ischemia.Patient denies chest pain.   7.Hypertension,  blood pressure well controlled on current BP medications which include spironolactone 50 mg,atenolol 12.5 mg, and IV Lasix 20 mg BID. Blood pressure dropped to 80 systolic after receiving oxycodone.  8. AKI on CKD,   Recommendations  1.  Agree with overall current therapy with primary treatment being medical management acute kidney injury with diuresis with cardiovascular issues continuing  to be relatively stable 2.  Continue diuresis as per nephrology 3.  No change in current medical regimen for heart rate control including atenolol with reasonable heart rate control at this time 4.  Defer chronic anticoagulation with history of recent GI bleed and thrombocytopenia 5.  Pain management per hospitalist 6.  No further cardiac diagnostics necessary at this time  Corey Skains, MD, PhD, [facility] 06/25/2020 2:55 PM

## 2020-06-25 NOTE — Progress Notes (Addendum)
Physical Therapy Treatment Patient Details Name: Patrick [name] MRN: [id number] DOB: May 27, 1946 Today's Date: 06/25/2020    History of Present Illness Patrick [name] is a 72yoM who comes to [facility] on 8/23 after progressive LEE, SOB, and DOE. Pt admitted in CHF exacerbation, also noted to have progressed anemia compared to 2 months ago whence he received 2u PRBC. PMH: DM, HTN, AF not on anticoag 2/2 GIB hx, CKD3a, combined CHF, AS, cirrhosis. At baseline pt is modified independent with basic ADL and household mobility.    PT Comments    Pt in bed upon entry, recently made awake for vitals assessment, pt agreeable to treatment. Supervision for bed mobility to EOB, minA for STS transfer, from EOB, however with grabbars in BR requires no physical assistance. Pt able to AMB slowly and safely with RW to/from toilet, ~49ft each way. Pt has slight desaturation to 88% on 1L/min O2. Pt left up in recliner at end of session, encouraged to stay up for 1 hour. This is the first time pt has gotten OOB with our services since arrival, but he typically does not require physical assist to come to standing, hence he has some weakness/deconditioning of note this date. Will update recommendations to HHPT at DC.    Follow Up Recommendations  Home health PT     Equipment Recommendations  None recommended by PT    Recommendations for Other Services       Precautions / Restrictions Precautions Precautions: Fall Restrictions Weight Bearing Restrictions: No    Mobility  Bed Mobility Overal bed mobility: Modified Independent                Transfers Overall transfer level: Needs assistance Equipment used: Rolling walker (2 wheeled) Transfers: Sit to/from Stand Sit to Stand: Min assist         General transfer comment: heavy minA, then able to balance without issue  Ambulation/Gait Ambulation/Gait assistance: Min guard Gait Distance (Feet): 18 Feet (to toilet, then back to  recliner) Assistive device: Rolling walker (2 wheeled) Gait Pattern/deviations: Step-to pattern     General Gait Details: slow, weak appearing   Stairs             Wheelchair Mobility    Modified Rankin (Stroke Patients Only)       Balance Overall balance assessment: Mild deficits observed, not formally tested;Modified Independent                                          Cognition Arousal/Alertness:  (drowsy) Behavior During Therapy: WFL for tasks assessed/performed Overall Cognitive Status: Difficult to assess                                        Exercises      General Comments        Pertinent Vitals/Pain Pain Assessment: No/denies pain    Home Living                      Prior Function            PT Goals (current goals can now be found in the care plan section) Acute Rehab PT Goals Patient Stated Goal: get this fluid off'a me PT Goal Formulation: With patient Time For Goal Achievement: 07/04/20 Potential to Achieve  Goals: Good Progress towards PT goals: Not progressing toward goals - comment    Frequency    Min 2X/week      PT Plan Current plan remains appropriate    Co-evaluation              AM-PAC PT "6 Clicks" Mobility   Outcome Measure  Help needed turning from your back to your side while in a flat bed without using bedrails?: A Little Help needed moving from lying on your back to sitting on the side of a flat bed without using bedrails?: A Little Help needed moving to and from a bed to a chair (including a wheelchair)?: A Little Help needed standing up from a chair using your arms (e.g., wheelchair or bedside chair)?: A Little Help needed to walk in hospital room?: A Little Help needed climbing 3-5 steps with a railing? : A Little 6 Click Score: 18    End of Session Equipment Utilized During Treatment: Oxygen Activity Tolerance: Patient limited by fatigue;No increased  pain Patient left: in chair;with chair alarm set;with call bell/phone within reach Nurse Communication: Mobility status PT Visit Diagnosis: Muscle weakness (generalized) (M62.81);Difficulty in walking, not elsewhere classified (R26.2)     Time: 6546-5035 PT Time Calculation (min) (ACUTE ONLY): 31 min  Charges:  $Therapeutic Exercise: 23-37 mins                     12:02 PM, 06/25/20 Etta Grandchild, PT, DPT Physical Therapist - [facility]  [phone] ([place])    [place] 06/25/2020, 12:02 PM

## 2020-06-25 NOTE — Progress Notes (Signed)
PROGRESS NOTE    Patrick [name]  ERX:[id number] DOB: October 17, 1946 DOA: 06/20/2020 PCP: Idelle Crouch, MD   Brief Narrative:  Patrick [name] is a 74 y.o. M with hx. DM, HTN, A. Fib not on anticoagulation due to thrombocytopenia and history of GI bleed requiring transfusion from suspected AVM, CKD IIIa, combined CHF, moderate aortic stenosis and cirrhosis who presented with one week of worsening shortness of breath and severe leg swelling. In the ER, chest x-ray showed congestion, legs were severely swollen.  He was started on IV diuretics and admitted to the hospitalist service.  He is admitted for acute on chronic systolic and diastolic CHF. He reports feeling better, cardiology recommended reduce the dose of atenolol given bradycardia and lasix due to worsening renal function.  Patient is making slow improvement,  nephrology consulted due to the worsening renal functions.  Renal functions are improving,  patient continues to have anemia and thrombocytopenia.   Assessment & Plan:   Principal Problem:   Acute on chronic combined systolic and diastolic CHF (congestive heart failure) (HCC) Active Problems:   Diabetes (HCC)   Thrombocytopenia (HCC)   Chronic kidney disease, stage 3a   Iron deficiency anemia due to chronic blood loss   Chronic a-fib (HCC)   Benign essential HTN   Moderate aortic stenosis   History of GI bleed   Acute CHF (congestive heart failure) (HCC)   Acute on chronic diastolic CHF (congestive heart failure) (HCC)   Acute on Chronic systolic and diastolic CHF Moderate aortic stenosis Admitted and started on Lasix 40 IV, prompt urine output so far, about 1L.   -EF 40-45% last month.  This is new reduced EF last month, evaluated by primary Cardiologist at that time, deferred GDMT or ischemic work up.   -Reduce  Furosemide 20 mg IV twice a day. -K supplement -Strict I/Os, daily weights, telemetry  -Daily monitoring renal function, renal functions started to  trend down. -hold low dose lisinopril for HFrEF and monitor BP and Creatinine. -Continue atenolol but at lower dose due to bradycardia and low BP. -Reports feeling better, still has significant edema.    Iron deficiency anemia due to chronic blood loss. History of GI bleed with AVM Hgb dropped to 7.8 today.  No clinical bleeding at this point to warrant GI evaluation.  Known AVMs.   Did require 2 units transfusion last hospital stay. -Hgb remained stable above 8. -Transfusion threashold 8 g/dL given HF -Patient received erythropoietin x 1,   Hematology consulted will follow up recommendation.  Thrombocytopenia - stable.  Alcoholic liver cirrhosis - stable.  Diabetes Glucose controlled -Continue SS corrections -COntinue DPPIV -Hold metformin  Chronic kidney disease IIIa Cr is near basleine with eGFR high 40s. Serum creatinine trending up because of Lasix, with reduced Lasix to 20 mg twice daily, renal functions started to show improvement. Monitor renal functions.  Nephrology consulted, Recommended to continue current management.  Chronic atrial fibrillation Not anticoagulation candidate due to chronic GI bleeding from AVMs and thrombocytopenia. HR controlled/low. -Continue atenolol  Hypertension -Continue atenolol, spironolactone and start lisinopril   COPD -Continue Symbicort as formulary alternative     DVT prophylaxis: SCDs Code Status: Full  Family Communication: No one at bedside, discussed with patient in detail. Disposition Plan:  Dispo: The patient is from: Home  Anticipated d/c is to: Home  Anticipated d/c date is: 1-2 days  Patient currently is not medically stable to d/c.ongoing treatment for CHF.   Consultants:   Procedures:  Antimicrobials:  Anti-infectives (From admission, onward)   None      Subjective: Patient was seen and examined at bedside.  No overnight events, Patient appears much better,  denies any shortness of breath, leg swelling is much better. Objective: Vitals:   06/25/20 0457 06/25/20 0812 06/25/20 0828 06/25/20 1050  BP: 107/90 (!) 117/51  (!) 118/47  Pulse: (!) 55 65 75 (!) 55  Resp: _0 Temp: 98.2 F (36.8 C) 98.5 F (36.9 C)  98.3 F (36.8 C)  TempSrc: Oral Oral  Oral  SpO2: 93% 99% (!) 89% 98%  Weight: 108 kg     Height:        Intake/Output Summary (Last 24 hours) at 06/25/2020 1319 Last data filed at 06/25/2020 0950 Gross per 24 hour  Intake 480 ml  Output 3550 ml  Net -3070 ml   Filed Weights   06/23/20 0430 06/24/20 0450 06/25/20 0457  Weight: 111.2 kg 110.4 kg 108 kg    Examination:  General exam: Appears calm and comfortable  Respiratory system: Clear to auscultation. Respiratory effort normal. Cardiovascular system: S1 & S2 heard, RRR. No JVD, murmurs, rubs, gallops or clicks. No pedal edema. Gastrointestinal system: Abdomen is nondistended, soft and nontender. No organomegaly or masses felt. Normal bowel sounds heard. Central nervous system: Alert and oriented. No focal neurological deficits. Extremities:  Bilateral Leg edema. Skin: No rashes, lesions or ulcers Psychiatry: Judgement and insight appear normal. Mood & affect appropriate.     Data Reviewed: I have personally reviewed following labs and imaging studies  CBC: Recent Labs  Lab 06/19/20 1610 06/19/20 1610 06/21/20 0445 06/21/20 0445 06/21/20 0940 06/22/20 0450 06/23/20 0621 06/24/20 0610 06/25/20 0442  WBC 5.3  --  5.3  --   --  5.0 5.4  --  2.4*  HGB 8.2*   < > 8.0*   < > 8.0* 8.0* 8.1* 8.4* 7.8*  HCT 25.9*   < > 25.0*   < > 25.3* 24.3* 25.3* 25.1* 23.8*  MCV 91.2  --  92.6  --   --  89.3 91.7  --  89.8  PLT 76*  --  68*  --   --  65* 70*  --  46*   < > = values in this interval not displayed.   Basic Metabolic Panel: Recent Labs  Lab 06/21/20 0445 06/22/20 0450 06/23/20 0621 06/24/20 0610 06/25/20 0442  NA 138 139 141 138 133*  K 4.5 4.6 5.1  5.1 4.2  CL 104 105 105 103 98  CO2 _1 GLUCOSE 97 88 90 92 92  BUN 39* 44* 50* 50* 41*  CREATININE 1.64* 2.18* 2.22* 1.97* 1.75*  CALCIUM 8.6* 8.4* 8.8* 8.5* 8.6*  MG  --  1.9  --  2.1  --   PHOS  --  5.3*  --  4.7*  --    GFR: Estimated Creatinine Clearance: 44.8 mL/min (A) (by C-G formula based on SCr of 1.75 mg/dL (H)). Liver Function Tests: Recent Labs  Lab 06/22/20 0450  AST 25  ALT 13  ALKPHOS 58  BILITOT 1.3*  PROT 7.0  ALBUMIN 3.1*   No results for input(s): LIPASE, AMYLASE in the last 168 hours. No results for input(s): AMMONIA in the last 168 hours. Coagulation Profile: No results for input(s): INR, PROTIME in the last 168 hours. Cardiac Enzymes: No results for input(s): CKTOTAL, CKMB, CKMBINDEX, TROPONINI in the last 168 hours. BNP (last 3 results) No results for input(s):  PROBNP in the last 8760 hours. HbA1C: No results for input(s): HGBA1C in the last 72 hours. CBG: Recent Labs  Lab 06/24/20 0734 06/24/20 1132 06/24/20 1700 06/25/20 0813 06/25/20 1148  GLUCAP 91 100* 137* 93 102*   Lipid Profile: No results for input(s): CHOL, HDL, LDLCALC, TRIG, CHOLHDL, LDLDIRECT in the last 72 hours. Thyroid Function Tests: No results for input(s): TSH, T4TOTAL, FREET4, T3FREE, THYROIDAB in the last 72 hours. Anemia Panel: Recent Labs    06/23/20 0621 06/24/20 0610 06/24/20 1548  VITAMINB12 1,019*  --   --   FOLATE  --  32.0  --   FERRITIN  --  69  --   TIBC  --  385  --   IRON  --  83  --   RETICCTPCT  --   --  1.6   Sepsis Labs: No results for input(s): PROCALCITON, LATICACIDVEN in the last 168 hours.  Recent Results (from the past 240 hour(s))  SARS Coronavirus 2 by RT PCR (hospital order, performed in [facility] hospital lab) Nasopharyngeal Nasopharyngeal Swab     Status: None   Collection Time: 06/21/20  8:18 AM   Specimen: Nasopharyngeal Swab  Result Value Ref Range Status   SARS Coronavirus 2 NEGATIVE NEGATIVE Final    Comment:  (NOTE) SARS-CoV-2 target nucleic acids are NOT DETECTED.  The SARS-CoV-2 RNA is generally detectable in upper and lower respiratory specimens during the acute phase of infection. The lowest concentration of SARS-CoV-2 viral copies this assay can detect is 250 copies / mL. A negative result does not preclude SARS-CoV-2 infection and should not be used as the sole basis for treatment or other patient management decisions.  A negative result may occur with improper specimen collection / handling, submission of specimen other than nasopharyngeal swab, presence of viral mutation(s) within the areas targeted by this assay, and inadequate number of viral copies (<250 copies / mL). A negative result must be combined with clinical observations, patient history, and epidemiological information.  Fact Sheet for Patients:   StrictlyIdeas.no  Fact Sheet for Healthcare Providers: BankingDealers.co.za  This test is not yet approved or  cleared by the [place] FDA and has been authorized for detection and/or diagnosis of SARS-CoV-2 by FDA under an Emergency Use Authorization (EUA).  This EUA will remain in effect (meaning this test can be used) for the duration of the COVID-19 declaration under Section 564(b)(1) of the Act, 21 U.S.C. section 360bbb-3(b)(1), unless the authorization is terminated or revoked sooner.  Performed at [facility], [address] [place], [place] [address]     Radiology Studies: DG Chest 1 View  Result Date: 06/24/2020 CLINICAL DATA:  Congestive heart failure. EXAM: CHEST  1 VIEW COMPARISON:  June 19, 2020 FINDINGS: Bilateral pulmonary opacities, particularly in the bases have worsened in the interval. Stable cardiomegaly. The hila and mediastinum are unchanged. No pneumothorax. Probable left effusion. IMPRESSION: Increasing bibasilar pulmonary opacities may represent multifocal pneumonia versus asymmetric  edema. Cardiomegaly and small left effusion. Electronically Signed   By: Dorise Bullion III M.D   On: 06/24/2020 16:13   US RENAL  Result Date: 06/24/2020 CLINICAL DATA:  ATN EXAM: RENAL / URINARY TRACT ULTRASOUND COMPLETE COMPARISON:  None. FINDINGS: Right Kidney: Renal measurements: 10.7 x 5.0 x 4.1 cm = volume: 115 mL. Echogenicity is increased. No mass or hydronephrosis visualized. Left Kidney: Renal measurements: 10.1 x 4.8 x 5.4 cm = volume: 137 mL. Echogenicity is increased. No mass or hydronephrosis visualized. Bladder: Appears normal  for degree of bladder distention. Other: None. IMPRESSION: Increased echogenicity of the bilateral kidneys as can be seen in medical renal disease. No hydronephrosis. Electronically Signed   By: Audie Pinto M.D.   On: 06/24/2020 16:09    Scheduled Meds: . atenolol  12.5 mg Oral Daily  . epoetin (EPOGEN/PROCRIT) injection  3,000 Units Subcutaneous Q14 Days  . fluticasone furoate-vilanterol  1 puff Inhalation Daily  . furosemide  20 mg Intravenous Q12H  . insulin aspart  0-15 Units Subcutaneous TID WC  . insulin aspart  0-5 Units Subcutaneous QHS  . linagliptin  5 mg Oral Daily  . melatonin  2.5 mg Oral QHS  . sodium chloride flush  3 mL Intravenous Q12H  . spironolactone  50 mg Oral Daily   Continuous Infusions: . sodium chloride       LOS: 5 days    Time spent: 25 mins    Shawna Clamp, MD [facility]   If 7PM-7AM, please contact night-coverage

## 2020-06-26 DIAGNOSIS — I5043 Acute on chronic combined systolic (congestive) and diastolic (congestive) heart failure: Secondary | ICD-10-CM

## 2020-06-26 LAB — BASIC METABOLIC PANEL
Anion gap: 7 (ref 5–15)
BUN: 38 mg/dL — ABNORMAL HIGH (ref 8–23)
CO2: 30 mmol/L (ref 22–32)
Calcium: 8.6 mg/dL — ABNORMAL LOW (ref 8.9–10.3)
Chloride: 99 mmol/L (ref 98–111)
Creatinine, Ser: 1.62 mg/dL — ABNORMAL HIGH (ref 0.61–1.24)
GFR calc Af Amer: 48 mL/min — ABNORMAL LOW (ref >60)
GFR calc non Af Amer: 41 mL/min — ABNORMAL LOW (ref >60)
Glucose, Bld: 87 mg/dL (ref 70–99)
Potassium: 4.2 mmol/L (ref 3.5–5.1)
Sodium: 136 mmol/L (ref 135–145)

## 2020-06-26 LAB — GLUCOSE, CAPILLARY
Glucose-Capillary: 88 mg/dL (ref 70–99)
Glucose-Capillary: 105 mg/dL — ABNORMAL HIGH (ref 70–99)
Glucose-Capillary: 100 mg/dL — ABNORMAL HIGH (ref 70–99)
Glucose-Capillary: 93 mg/dL (ref 70–99)

## 2020-06-26 LAB — CBC
HCT: 24 % — ABNORMAL LOW (ref 39.0–52.0)
Hemoglobin: 8 g/dL — ABNORMAL LOW (ref 13.0–17.0)
MCH: 29.9 pg (ref 26.0–34.0)
MCHC: 33.3 g/dL (ref 30.0–36.0)
MCV: 89.6 fL (ref 80.0–100.0)
Platelets: 46 10*3/uL — ABNORMAL LOW (ref 150–400)
RBC: 2.68 MIL/uL — ABNORMAL LOW (ref 4.22–5.81)
RDW: 18.3 % — ABNORMAL HIGH (ref 11.5–15.5)
WBC: 2 10*3/uL — ABNORMAL LOW (ref 4.0–10.5)
nRBC: 0 % (ref 0.0–0.2)

## 2020-06-26 LAB — CBC WITH DIFFERENTIAL/PLATELET
Abs Immature Granulocytes: 0 10*3/uL (ref 0.00–0.07)
Basophils Absolute: 0 10*3/uL (ref 0.0–0.1)
Basophils Relative: 1 %
Eosinophils Absolute: 0.1 10*3/uL (ref 0.0–0.5)
Eosinophils Relative: 3 %
HCT: 24.8 % — ABNORMAL LOW (ref 39.0–52.0)
Hemoglobin: 8 g/dL — ABNORMAL LOW (ref 13.0–17.0)
Immature Granulocytes: 0 %
Lymphocytes Relative: 21 %
Lymphs Abs: 0.4 10*3/uL — ABNORMAL LOW (ref 0.7–4.0)
MCH: 29.2 pg (ref 26.0–34.0)
MCHC: 32.3 g/dL (ref 30.0–36.0)
MCV: 90.5 fL (ref 80.0–100.0)
Monocytes Absolute: 0.8 10*3/uL (ref 0.1–1.0)
Monocytes Relative: 38 %
Neutro Abs: 0.7 10*3/uL — ABNORMAL LOW (ref 1.7–7.7)
Neutrophils Relative %: 37 %
Platelets: 45 10*3/uL — ABNORMAL LOW (ref 150–400)
RBC: 2.74 MIL/uL — ABNORMAL LOW (ref 4.22–5.81)
RDW: 18.5 % — ABNORMAL HIGH (ref 11.5–15.5)
Smear Review: DECREASED
WBC: 2 10*3/uL — ABNORMAL LOW (ref 4.0–10.5)
nRBC: 0 % (ref 0.0–0.2)

## 2020-06-26 LAB — PHOSPHORUS: Phosphorus: 3.6 mg/dL (ref 2.5–4.6)

## 2020-06-26 LAB — MAGNESIUM: Magnesium: 1.6 mg/dL — ABNORMAL LOW (ref 1.7–2.4)

## 2020-06-26 MED ORDER — MAGNESIUM SULFATE 2 GM/50ML IV SOLN
2.0000 g | Freq: Once | INTRAVENOUS | Status: AC
  Administered 2020-06-26: 2 g via INTRAVENOUS
  Filled 2020-06-26: qty 50

## 2020-06-26 MED ORDER — THIAMINE HCL 100 MG PO TABS
100.0000 mg | ORAL_TABLET | Freq: Every day | ORAL | Status: DC
  Administered 2020-06-27: 100 mg via ORAL
  Filled 2020-06-26: qty 1

## 2020-06-26 MED ORDER — SIMVASTATIN 20 MG PO TABS
40.0000 mg | ORAL_TABLET | Freq: Every day | ORAL | Status: DC
  Administered 2020-06-26: 40 mg via ORAL
  Filled 2020-06-26: qty 2

## 2020-06-26 NOTE — Progress Notes (Signed)
[facility]  ROUNDING NOTE   Subjective:   UOP 1300 Furosemide 20mg  IV q12  Spironolactone.   Objective:  Vital signs in last 24 hours:  Temp:  [97.7 F (36.5 C)-98.8 F (37.1 C)] 98.8 F (37.1 C) (08/30 1128) Pulse Rate:  [41-65] 50 (08/30 1128) Resp:  [16-20] 16 (08/30 1128) BP: (105-134)/(45-56) 105/46 (08/30 1128) SpO2:  [94 %-100 %] 97 % (08/30 1128)  Weight change:  Filed Weights   06/23/20 0430 06/24/20 0450 06/25/20 0457  Weight: 111.2 kg 110.4 kg 108 kg    Intake/Output: I/O last 3 completed shifts: In: 480 [P.O.:480] Out: 4550 [Urine:4550]   Intake/Output this shift:  No intake/output data recorded.  Physical Exam: General: NAD,   Head: Normocephalic, atraumatic. Moist oral mucosal membranes  Eyes: Anicteric, PERRL  Neck: Supple, trachea midline  Lungs:  Clear to auscultation  Heart: Regular rate and rhythm  Abdomen:  Soft, nontender,   Extremities:  trace peripheral edema.  Neurologic: Nonfocal, moving all four extremities  Skin: No lesions        Basic Metabolic Panel: Recent Labs  Lab 06/22/20 0450 06/22/20 0450 06/23/20 0621 06/23/20 0621 06/24/20 0610 06/25/20 0442 06/26/20 0512  NA 139  --  141  --  138 133* 136  K 4.6  --  5.1  --  5.1 4.2 4.2  CL 105  --  105  --  103 98 99  CO2 27  --  26  --  27 29 30   GLUCOSE 88  --  90  --  92 92 87  BUN 44*  --  50*  --  50* 41* 38*  CREATININE 2.18*  --  2.22*  --  1.97* 1.75* 1.62*  CALCIUM 8.4*   < > 8.8*   < > 8.5* 8.6* 8.6*  MG 1.9  --   --   --  2.1  --  1.6*  PHOS 5.3*  --   --   --  4.7*  --  3.6   < > = values in this interval not displayed.    Liver Function Tests: Recent Labs  Lab 06/22/20 0450  AST 25  ALT 13  ALKPHOS 58  BILITOT 1.3*  PROT 7.0  ALBUMIN 3.1*   No results for input(s): LIPASE, AMYLASE in the last 168 hours. No results for input(s): AMMONIA in the last 168 hours.  CBC: Recent Labs  Lab 06/21/20 0445 06/21/20 0940 06/22/20 0450  06/23/20 0621 06/24/20 0610 06/25/20 0442 06/26/20 0512  WBC 5.3  --  5.0 5.4  --  2.4* 2.0*  HGB 8.0*   < > 8.0* 8.1* 8.4* 7.8* 8.0*  HCT 25.0*   < > 24.3* 25.3* 25.1* 23.8* 24.0*  MCV 92.6  --  89.3 91.7  --  89.8 89.6  PLT 68*  --  65* 70*  --  46* 46*   < > = values in this interval not displayed.    Cardiac Enzymes: No results for input(s): CKTOTAL, CKMB, CKMBINDEX, TROPONINI in the last 168 hours.  BNP: Invalid input(s): POCBNP  CBG: Recent Labs  Lab 06/25/20 1148 06/25/20 1731 06/25/20 2109 06/26/20 0749 06/26/20 1128  GLUCAP 102* 104* 94 88 105*    Microbiology: Results for orders placed or performed during the hospital encounter of 06/20/20  SARS Coronavirus 2 by RT PCR (hospital order, performed in [facility] hospital lab) Nasopharyngeal Nasopharyngeal Swab     Status: None   Collection Time: 06/21/20  8:18 AM  Specimen: Nasopharyngeal Swab  Result Value Ref Range Status   SARS Coronavirus 2 NEGATIVE NEGATIVE Final    Comment: (NOTE) SARS-CoV-2 target nucleic acids are NOT DETECTED.  The SARS-CoV-2 RNA is generally detectable in upper and lower respiratory specimens during the acute phase of infection. The lowest concentration of SARS-CoV-2 viral copies this assay can detect is 250 copies / mL. A negative result does not preclude SARS-CoV-2 infection and should not be used as the sole basis for treatment or other patient management decisions.  A negative result may occur with improper specimen collection / handling, submission of specimen other than nasopharyngeal swab, presence of viral mutation(s) within the areas targeted by this assay, and inadequate number of viral copies (<250 copies / mL). A negative result must be combined with clinical observations, patient history, and epidemiological information.  Fact Sheet for Patients:   StrictlyIdeas.no  Fact Sheet for Healthcare  Providers: BankingDealers.co.za  This test is not yet approved or  cleared by the [place] FDA and has been authorized for detection and/or diagnosis of SARS-CoV-2 by FDA under an Emergency Use Authorization (EUA).  This EUA will remain in effect (meaning this test can be used) for the duration of the COVID-19 declaration under Section 564(b)(1) of the Act, 21 U.S.C. section 360bbb-3(b)(1), unless the authorization is terminated or revoked sooner.  Performed at [facility], [address] [place], [place] [address]     Coagulation Studies: No results for input(s): LABPROT, INR in the last 72 hours.  Urinalysis: No results for input(s): COLORURINE, LABSPEC, PHURINE, GLUCOSEU, HGBUR, BILIRUBINUR, KETONESUR, PROTEINUR, UROBILINOGEN, NITRITE, LEUKOCYTESUR in the last 72 hours.  Invalid input(s): APPERANCEUR    Imaging: No results found.   Medications:   . sodium chloride     . atenolol  12.5 mg Oral Daily  . epoetin (EPOGEN/PROCRIT) injection  3,000 Units Subcutaneous Q14 Days  . fluticasone furoate-vilanterol  1 puff Inhalation Daily  . furosemide  20 mg Intravenous Q12H  . insulin aspart  0-15 Units Subcutaneous TID WC  . insulin aspart  0-5 Units Subcutaneous QHS  . linagliptin  5 mg Oral Daily  . melatonin  2.5 mg Oral QHS  . sodium chloride flush  3 mL Intravenous Q12H  . spironolactone  50 mg Oral Daily   sodium chloride, acetaminophen, oxyCODONE, promethazine, sodium chloride flush  Assessment/ Plan:  [name] is a 74 y.o. white male with atrial fibrillation, valvular heart disease, hypertension, coronary artery disease, pulmonary hypertension, diabetes mellitus type II, hyperlipidemia, who is admitted to [facility] on 06/20/2020 for Anemia due to chronic blood loss [D50.0] Acute CHF (congestive heart failure) ([place]) [I50.9] Acute on chronic combined systolic and diastolic congestive heart failure ([place]) [I50.43] Acute  on chronic diastolic CHF (congestive heart failure) ([place]) [I50.33]   1. Acute renal failure on chronic kidney disease stage IIIB: with bland urine. Baseline creatinine of 1.5, GFR of 46 on 7/29.  Chronic kidney disease secondary to hypertension and NSAIDs (takes nabumetone at home) Acute renal failure secondary to acute cardiorenal syndrome.  Renal ultrasound reviewed. Not on an ACE-I/ARB as outpatient.  No recent IV contrast exposure.   2. Hypertension with acute exacerbation on  Chronic systolic and diastolic congestive heart failure. Echocardiogram on 05/06/20 Home regimen is furosemide, spironolactone, and atenolol - IV furosemide - spironolactone  3. Diabetes mellitus type II with chronic kidney disease: takes metformin as outpatient.   4. Hypokalemia:  - Potassium chloride.   5. Anemia with renal failure and  thrombocytopenia Platelets are trending downward. No Heparin exposure in last few days.    LOS: [place] 8/30/20212:31 PM

## 2020-06-26 NOTE — Progress Notes (Signed)
Mobility Specialist - Progress Note   Pre-mobility: 51 HR, 106/44 BP, 99% SpO2 During mobility: 68 HR, 89% SpO2 Post-mobility: 57 HR, 127/42 BP, 97% SpO2   Pt was lying in bed upon arrival utilizing 1.5L of O2. Pt agreed to session. Pt was AOx4. Pt was SBA in supine-EOB and minA in sit-to-stand. Pt was motivated to attempt ambulation without O2, but after getting to EOB pt began c/o SOB. O2 desat to 89% without [place]. Mobility went over PLB techniques with pt as [place] was reapplied to O2 tank at 2L. Pt was minA in ambulating with RW. Pt needed cues to keep RW on the floor while ambulating. After ambulating 30', pt c/o LE weakness limiting further ability. After getting pt back to bed, pt was able to rate his RPE a "5/10". Overall, pt tolerated session well. Pt was left in bed with phone/call bell in reach. Nurse was notified.    Kathee Delton Mobility Specialist 06/26/20, 4:50 PM

## 2020-06-26 NOTE — Consult Note (Addendum)
[place] CONSULT NOTE  Patient Care Team: Idelle Crouch, MD as PCP - General (Internal Medicine)  CHIEF COMPLAINTS/PURPOSE OF CONSULTATION: Thrombocytopenia  HISTORY OF PRESENTING ILLNESS:  Patrick [name] 74 y.o.  male with multiple medical problems including but not limited to chronic iron deficiency anemia; thrombocytopenia/cirrhosis hypersplenism; CKD and also congestive heart failure rescheduled to the hospital for acute on chronic congestive heart failure.  Patient has been eval by cardiology; also by nephrology.  Patient is on aggressive diuresis-is currently -6 L.  Patient breathing Is improved.  However patient is noted to have worsening thrombocytopenia-platelets 40s; baseline 70s.  No bleeding.  Positive for easy bruising.    Review of Systems  Constitutional: Positive for malaise/fatigue. Negative for chills, diaphoresis, fever and weight loss.  HENT: Negative for nosebleeds and sore throat.   Eyes: Negative for double vision.  Respiratory: Positive for cough, sputum production and shortness of breath. Negative for hemoptysis and wheezing.   Cardiovascular: Negative for chest pain, palpitations, orthopnea and leg swelling.  Gastrointestinal: Positive for nausea. Negative for abdominal pain, blood in stool, constipation, diarrhea, heartburn, melena and vomiting.  Genitourinary: Negative for dysuria, frequency and urgency.  Musculoskeletal: Positive for back pain and joint pain.  Skin: Negative.  Negative for itching and rash.  Neurological: Negative for dizziness, tingling, focal weakness, weakness and headaches.  Endo/Heme/Allergies: Does not bruise/bleed easily.  Psychiatric/Behavioral: Negative for depression. The patient is not nervous/anxious and does not have insomnia.      MEDICAL HISTORY:  Past Medical History:  Diagnosis Date  . ASCVD (arteriosclerotic cardiovascular disease)   . Chronic pulmonary hypertension ([place])   . Diabetes  mellitus without complication ([place])   . GERD (gastroesophageal reflux disease)   . Heart attack ([place]) 1989  . Heart disease   . Hyperlipidemia   . Hypertension   . IDA (iron deficiency anemia)   . Moderate mitral insufficiency   . Moderate tricuspid insufficiency   . OA (osteoarthritis)   . Paroxysmal A-fib ([place])   . Thrombocytosis ([place])   . Tremor     SURGICAL HISTORY: Past Surgical History:  Procedure Laterality Date  . [place]   with stainless steel stent  . CATARACT EXTRACTION    . Closed Reduction Vertebral Process Fracture  1966  . COLONOSCOPY  10/13/2014  . ESOPHAGOGASTRODUODENOSCOPY (EGD) WITH PROPOFOL N/A 05/08/2020   Procedure: ESOPHAGOGASTRODUODENOSCOPY (EGD) WITH PROPOFOL;  Surgeon: [name], MD;  Location: [facility] ENDOSCOPY;  Service: Gastroenterology;  Laterality: N/A;  . HEMORRHOIDECTOMY WITH HEMORRHOID BANDING    . HERNIA REPAIR     x 2  . KNEE SURGERY     bilateral knee surgery    SOCIAL HISTORY: Social History   Socioeconomic History  . Marital status: Married    Spouse name: Not on file  . Number of children: Not on file  . Years of education: Not on file  . Highest education level: Not on file  Occupational History  . Not on file  Tobacco Use  . Smoking status: Former Smoker    Packs/day: 0.50    Years: 40.00    Pack years: 20.00    Types: Cigarettes    Quit date: 04/18/1997    Years since quitting: 23.2  . Smokeless tobacco: Never Used  Vaping Use  . Vaping Use: Never used  Substance and Sexual Activity  . Alcohol use: Not Currently    Alcohol/week: 50.0 standard drinks    Types: 50 Shots of liquor per week  Comment: "I mix a couple cups whisky/day"  . Drug use: Not Currently  . Sexual activity: Yes  Other Topics Concern  . Not on file  Social History Narrative  . Not on file   Social Determinants of Health   Financial Resource Strain:   . Difficulty of Paying Living Expenses: Not on file  Food  Insecurity:   . Worried About Charity fundraiser in the Last Year: Not on file  . Ran Out of Food in the Last Year: Not on file  Transportation Needs:   . Lack of Transportation (Medical): Not on file  . Lack of Transportation (Non-Medical): Not on file  Physical Activity:   . Days of Exercise per Week: Not on file  . Minutes of Exercise per Session: Not on file  Stress:   . Feeling of Stress : Not on file  Social Connections:   . Frequency of Communication with Friends and Family: Not on file  . Frequency of Social Gatherings with Friends and Family: Not on file  . Attends Religious Services: Not on file  . Active Member of Clubs or Organizations: Not on file  . Attends Archivist Meetings: Not on file  . Marital Status: Not on file  Intimate Partner Violence:   . Fear of Current or Ex-Partner: Not on file  . Emotionally Abused: Not on file  . Physically Abused: Not on file  . Sexually Abused: Not on file    FAMILY HISTORY: Family History  Problem Relation Age of Onset  . Heart attack Father     ALLERGIES:  is allergic to aspirin and dextrose.  MEDICATIONS:  Current Facility-Administered Medications  Medication Dose Route Frequency Provider Last Rate Last Admin  . 0.9 %  sodium chloride infusion  250 mL Intravenous PRN Athena Masse, MD      . acetaminophen (TYLENOL) tablet 650 mg  650 mg Oral Q4H PRN Athena Masse, MD   650 mg at 06/21/20 2054  . atenolol (TENORMIN) tablet 12.5 mg  12.5 mg Oral Daily Clabe Seal, PA-C   12.5 mg at 06/26/20 0944  . epoetin alfa (EPOGEN) injection 3,000 Units  3,000 Units Subcutaneous Q14 Days Liana Gerold, MD   3,000 Units at 06/25/20 1627  . fluticasone furoate-vilanterol (BREO ELLIPTA) 200-25 MCG/INH 1 puff  1 puff Inhalation Daily Danford, Suann Larry, MD   1 puff at 06/26/20 0946  . furosemide (LASIX) injection 20 mg  20 mg Intravenous Q12H Clabe Seal, PA-C   20 mg at 06/26/20 0401  . insulin aspart (novoLOG)  injection 0-15 Units  0-15 Units Subcutaneous TID WC Athena Masse, MD   2 Units at 06/24/20 1716  . insulin aspart (novoLOG) injection 0-5 Units  0-5 Units Subcutaneous QHS Judd Gaudier V, MD      . linagliptin (TRADJENTA) tablet 5 mg  5 mg Oral Daily Danford, Suann Larry, MD   5 mg at 06/26/20 0944  . melatonin tablet 2.5 mg  2.5 mg Oral QHS Edwin Dada, MD   2.5 mg at 06/25/20 2116  . oxyCODONE (Oxy IR/ROXICODONE) immediate release tablet 5 mg  5 mg Oral Q12H PRN Shawna Clamp, MD   5 mg at 06/23/20 2127  . promethazine (PHENERGAN) injection 12.5 mg  12.5 mg Intravenous Q6H PRN Judd Gaudier V, MD      . sodium chloride flush (NS) 0.9 % injection 3 mL  3 mL Intravenous Q12H Athena Masse, MD   3 mL at  06/25/20 2117  . sodium chloride flush (NS) 0.9 % injection 3 mL  3 mL Intravenous PRN Athena Masse, MD      . spironolactone (ALDACTONE) tablet 50 mg  50 mg Oral Daily Edwin Dada, MD   50 mg at 06/26/20 0944      .  PHYSICAL EXAMINATION:  Vitals:   06/26/20 1128 06/26/20 1503  BP: (!) 105/46 (!) 110/47  Pulse: (!) 50 (!) 49  Resp: 16 17  Temp: 98.8 F (37.1 C) 98.6 F (37 C)  SpO2: 97% 98%   Filed Weights   06/23/20 0430 06/24/20 0450 06/25/20 0457  Weight: 245 lb 3.2 oz (111.2 kg) 243 lb 6.2 oz (110.4 kg) 238 lb (108 kg)    Physical Exam Constitutional:      Comments: Elderly male patient resting comfortably in the bed.  No acute distress.  HENT:     Head: Normocephalic and atraumatic.     Mouth/Throat:     Pharynx: No oropharyngeal exudate.  Eyes:     Pupils: Pupils are equal, round, and reactive to light.  Cardiovascular:     Rate and Rhythm: Normal rate and regular rhythm.  Pulmonary:     Effort: No respiratory distress.     Breath sounds: No wheezing.     Comments: Decreased air entry bilaterally.  No wheeze or crackles. Abdominal:     General: Bowel sounds are normal. There is no distension.     Palpations: Abdomen is soft.  There is no mass.     Tenderness: There is no abdominal tenderness. There is no guarding or rebound.  Musculoskeletal:        General: No tenderness. Normal range of motion.     Cervical back: Normal range of motion and neck supple.  Skin:    General: Skin is warm.     Comments: Multiple chronic bruises noted.  No active bleeding.  Neurological:     Mental Status: He is alert and oriented to person, place, and time.  Psychiatric:        Mood and Affect: Affect normal.      LABORATORY DATA:  I have reviewed the data as listed Lab Results  Component Value Date   WBC 2.0 (L) 06/26/2020   HGB 8.0 (L) 06/26/2020   HCT 24.0 (L) 06/26/2020   MCV 89.6 06/26/2020   PLT 46 (L) 06/26/2020   Recent Labs    05/05/20 1405 05/05/20 1849 05/08/20 0500 05/09/20 0453 06/22/20 0450 06/23/20 0621 06/24/20 0610 06/25/20 0442 06/26/20 0512  NA 138   < > 139   < > 139   < > 138 133* 136  K 4.5   < > 4.2   < > 4.6   < > 5.1 4.2 4.2  CL 109   < > 107   < > 105   < > 103 98 99  CO2 20*   < > 26   < > 27   < > 27 29 30   GLUCOSE 109*   < > 109*   < > 88   < > 92 92 87  BUN 33*   < > 35*   < > 44*   < > 50* 41* 38*  CREATININE 1.80*   < > 2.10*   < > 2.18*   < > 1.97* 1.75* 1.62*  CALCIUM 8.5*   < > 8.0*   < > 8.4*   < > 8.5* 8.6* 8.6*  GFRNONAA 36*   < >  30*   < > 29*   < > 33* 38* 41*  GFRAA 42*   < > 35*   < > 33*   < > 38* 43* 48*  PROT 7.4  --  6.3*  --  7.0  --   --   --   --   ALBUMIN 3.1*  --  2.8*  --  3.1*  --   --   --   --   AST 22  --  19  --  25  --   --   --   --   ALT 11  --  10  --  13  --   --   --   --   ALKPHOS 63  --  55  --  58  --   --   --   --   BILITOT 1.4*  --  1.5*  --  1.3*  --   --   --   --    < > = values in this interval not displayed.    RADIOGRAPHIC STUDIES: I have personally reviewed the radiological images as listed and agreed with the findings in the report. DG Chest 1 View  Result Date: 06/24/2020 CLINICAL DATA:  Congestive heart failure. EXAM:  CHEST  1 VIEW COMPARISON:  June 19, 2020 FINDINGS: Bilateral pulmonary opacities, particularly in the bases have worsened in the interval. Stable cardiomegaly. The hila and mediastinum are unchanged. No pneumothorax. Probable left effusion. IMPRESSION: Increasing bibasilar pulmonary opacities may represent multifocal pneumonia versus asymmetric edema. Cardiomegaly and small left effusion. Electronically Signed   By: Dorise Bullion III M.D   On: 06/24/2020 16:13   DG Chest 2 View  Result Date: 06/19/2020 CLINICAL DATA:  Shortness of breath.  Leg swelling. EXAM: CHEST - 2 VIEW COMPARISON:  05/05/2020 FINDINGS: Mild wedging of a lower thoracic vertebral body is similar to the prior CT. Reverse apical lordotic positioning on the frontal radiograph. Remote upper left rib fractures. Patient rotated left. Mild cardiomegaly. Trace bilateral pleural fluid. No pneumothorax. Pulmonary venous congestion, as evidenced by pulmonary interstitial prominence and indistinctness. Similar left base pulmonary opacities and volume loss, favoring atelectasis and/or scar. IMPRESSION: Cardiomegaly with pulmonary venous congestion and trace bilateral pleural effusions. Electronically Signed   By: Abigail Miyamoto M.D.   On: 06/19/2020 16:57   US RENAL  Result Date: 06/24/2020 CLINICAL DATA:  ATN EXAM: RENAL / URINARY TRACT ULTRASOUND COMPLETE COMPARISON:  None. FINDINGS: Right Kidney: Renal measurements: 10.7 x 5.0 x 4.1 cm = volume: 115 mL. Echogenicity is increased. No mass or hydronephrosis visualized. Left Kidney: Renal measurements: 10.1 x 4.8 x 5.4 cm = volume: 137 mL. Echogenicity is increased. No mass or hydronephrosis visualized. Bladder: Appears normal for degree of bladder distention. Other: None. IMPRESSION: Increased echogenicity of the bilateral kidneys as can be seen in medical renal disease. No hydronephrosis. Electronically Signed   By: Audie Pinto M.D.   On: 06/24/2020 16:09    Thrombocytopenia  [facility]) #74 year old male patient with multiple medical problems-with iron deficient anemia; cirrhosis hypersplenism/thrombocytopenia is currently admitted hospital for acute on chronic congestive heart failure noted to have worsening thrombocytopenia  #Thrombocytopenia-Baseline 70s; secondary cirrhosis/alcohol.  Most recently platelets are in the 40s-likely secondary to acute event/acute congestive heart failure.  Do not suspect HIT.  Monitor closely.  #Severe symptomatic anemia-multifactorial secondary to cirrhosis liver disease/Iron deficiency anemia/folate deficiency likely from history of GI bleed/AVMs/CKD stage III-hemoglobin 8.  Agree with Aranesp as per nephrology.  Will be happy to arrange for erythropoietin stimulating agent on outpatient-if recommended by nephrology   #Leukopenia white count 2.0-differential unavailable.  Again suspect secondary cirrhosis.  Chronic intermittent.  Will order differential today.  #Acute on chronic CHF-on Lasix/diuresis.  -6 L.  Breathing improved.  Defer to cardiology.  #Cirrhosis-decompensated child Pugh B/C-clinically stable.  Thank you Dr.Kumar for allowing me to participate in the care of your pleasant patient. Please do not hesitate to contact me with questions or concerns in the interim.  Discussed with Dr.Kumar.   All questions were answered. The patient knows to call the clinic with any problems, questions or concerns.    Cammie Sickle, MD 06/26/2020 3:18 PM

## 2020-06-26 NOTE — Progress Notes (Signed)
[facility] Cardiology  SUBJECTIVE: Patient overall feels slightly better than yesterday and significantly better since admission.  The patient continues to have chronic kidney disease with acute exacerbation and acute on chronic systolic diastolic dysfunction congestive heart failure with atrial fibrillation and controlled heart rate.  The patient has had recurrent exacerbation of the same thing which may be due to some noncompliance as well.  But overall has done better since admission with current therapy. Still concerned about dc today with possibel dc tomorrow  Vitals:   06/25/20 1731 06/25/20 2000 06/26/20 0348 06/26/20 0746  BP: (!) 112/45 (!) 134/56 (!) 116/51 (!) 127/56  Pulse: (!) 55 65 (!) 41 (!) 52  Resp: 19 20  16   Temp: 97.8 F (36.6 C) 97.7 F (36.5 C) 98.2 F (36.8 C) 98.4 F (36.9 C)  TempSrc:  Oral Oral Oral  SpO2: 94% 100% 96% 98%  Weight:      Height:         Intake/Output Summary (Last 24 hours) at 06/26/2020 0855 Last data filed at 06/26/2020 0700 Gross per 24 hour  Intake 480 ml  Output 2000 ml  Net -1520 ml      PHYSICAL EXAM  General: Well developed, well nourished, in no acute distress HEENT:  Normocephalic and atramatic Neck:  No JVD.  Lungs: Bibasilar decreased breath sounds with crackles bilaterally to auscultation and percussion. Heart: HRRR . Normal S1 and S2 without gallops or murmurs.  Abdomen: Bowel sounds are positive, abdomen soft and non-tender  Msk:  Back normal, normal gait. Normal strength and tone for age. Extremities: No clubbing, cyanosis 1-2+ lower extremity edema Neuro: Alert and oriented X 3. Psych:  Good affect, responds appropriately   LABS: Basic Metabolic Panel: Recent Labs    06/24/20 0610 06/24/20 0610 06/25/20 0442 06/26/20 0512  NA 138   < > 133* 136  K 5.1   < > 4.2 4.2  CL 103   < > 98 99  CO2 27   < > 29 30  GLUCOSE 92   < > 92 87  BUN 50*   < > 41* 38*  CREATININE 1.97*   < > 1.75* 1.62*  CALCIUM 8.5*   < >  8.6* 8.6*  MG 2.1  --   --  1.6*  PHOS 4.7*  --   --  3.6   < > = values in this interval not displayed.   Liver Function Tests: No results for input(s): AST, ALT, ALKPHOS, BILITOT, PROT, ALBUMIN in the last 72 hours. No results for input(s): LIPASE, AMYLASE in the last 72 hours. CBC: Recent Labs    06/25/20 0442 06/26/20 0512  WBC 2.4* 2.0*  HGB 7.8* 8.0*  HCT 23.8* 24.0*  MCV 89.8 89.6  PLT 46* 46*   Cardiac Enzymes: No results for input(s): CKTOTAL, CKMB, CKMBINDEX, TROPONINI in the last 72 hours. BNP: Invalid input(s): POCBNP D-Dimer: No results for input(s): DDIMER in the last 72 hours. Hemoglobin A1C: No results for input(s): HGBA1C in the last 72 hours. Fasting Lipid Panel: No results for input(s): CHOL, HDL, LDLCALC, TRIG, CHOLHDL, LDLDIRECT in the last 72 hours. Thyroid Function Tests: No results for input(s): TSH, T4TOTAL, T3FREE, THYROIDAB in the last 72 hours.  Invalid input(s): FREET3 Anemia Panel: Recent Labs    06/24/20 0610 06/24/20 1548  FOLATE 32.0  --   FERRITIN 69  --   TIBC 385  --   IRON 83  --   RETICCTPCT  --  1.6  DG Chest 1 View  Result Date: 06/24/2020 CLINICAL DATA:  Congestive heart failure. EXAM: CHEST  1 VIEW COMPARISON:  June 19, 2020 FINDINGS: Bilateral pulmonary opacities, particularly in the bases have worsened in the interval. Stable cardiomegaly. The hila and mediastinum are unchanged. No pneumothorax. Probable left effusion. IMPRESSION: Increasing bibasilar pulmonary opacities may represent multifocal pneumonia versus asymmetric edema. Cardiomegaly and small left effusion. Electronically Signed   By: Dorise Bullion III M.D   On: 06/24/2020 16:13   US RENAL  Result Date: 06/24/2020 CLINICAL DATA:  ATN EXAM: RENAL / URINARY TRACT ULTRASOUND COMPLETE COMPARISON:  None. FINDINGS: Right Kidney: Renal measurements: 10.7 x 5.0 x 4.1 cm = volume: 115 mL. Echogenicity is increased. No mass or hydronephrosis visualized. Left Kidney:  Renal measurements: 10.1 x 4.8 x 5.4 cm = volume: 137 mL. Echogenicity is increased. No mass or hydronephrosis visualized. Bladder: Appears normal for degree of bladder distention. Other: None. IMPRESSION: Increased echogenicity of the bilateral kidneys as can be seen in medical renal disease. No hydronephrosis. Electronically Signed   By: Audie Pinto M.D.   On: 06/24/2020 16:09     Echo LVEF 40 to 45% by 2D echocardiogram 05/06/2020  TELEMETRY: Atrial fibrillation at a rate of 54 bpm:  ASSESSMENT AND PLAN:  Principal Problem:   Acute on chronic combined systolic and diastolic CHF (congestive heart failure) (HCC) Active Problems:   Diabetes (HCC)   Thrombocytopenia (HCC)   Chronic kidney disease, stage 3a   Iron deficiency anemia due to chronic blood loss   Chronic a-fib (HCC)   Benign essential HTN   Moderate aortic stenosis   History of GI bleed   Acute CHF (congestive heart failure) (HCC)   Acute on chronic diastolic CHF (congestive heart failure) ([place])    1. Acute on chronic combined systolic and diastolic CHF, presenting withseveral month history ofprogressive exertional dyspnea and lower extremity edema with chest x-ray revealing pulmonary venous congestion and trace bilateral pleural effusions with BNP greater than 700.Initial diuresis and clinical improvement with IV Lasix. 2D echocardiogramon 05/06/2020 revealed mildlydecreasedleft ventricular function with LVEF 40 to 45% with severely dilated left ventricle, moderately elevated pulmonary artery systolicpressure, severe biatrial enlargement, trivial pericardial effusion, mild mitral,aortic, and tricuspid regurgitation, and mild to moderate aortic valve sclerosis/calcification without evidence for significant aortic stenosis with aortic valve mean gradient 8.0 mmHg, peak gradient 15.2 mmHg, with a calculated aortic valve area 1.35 cm.  2.Chronic atrial fibrillation, not on chronic anticoagulation due to recent  history of GI bleed, requiring blood transfusions, and history of thrombocytopenia.Current ventricular rate 54 bpm.  Atenolol dose decreased to 12.5 mg daily.  And will continue at this time  3.Bradycardia,with atrial fibrillation at a rate of 54 bpm on lower dose atenolol 12.5 mg daily.  But stable at this time  4.Anemia with recent history of GI bleed, hemoglobin 8.0.GI following  5. Mild dilated cardiomyopathywith LVEF 40 to 45%, on atenolol; lisinopril initiated, then held due to worsening renal function and hypotension.  6.Coronary artery disease, status post MI and coronary stentin 1990s; recent Pinch in 02/2020 with normal LV function without evidence of scar or ischemia.Patient denies chest pain.   7.Hypertension,  blood pressure well controlled on current BP medications which include spironolactone 50 mg,atenolol 12.5 mg, and IV Lasix 20 mg BID. Blood pressure dropped to 80 systolic after receiving oxycodone.  8. AKI on CKD,   Recommendations  1.  Agree with overall current therapy with primary treatment being medical management acute kidney injury  with diuresis with cardiovascular issues continuing to be relatively stable 2.  Continue diuresis as per nephrology 3.  No change in current medical regimen for heart rate control including atenolol with reasonable heart rate control at this time 4.  Defer chronic anticoagulation with history of recent GI bleed and thrombocytopenia 5.  Possible dc as soon as amulating well 6.  No further cardiac diagnostics necessary at this time  Corey Skains, MD, PhD, [facility] 06/26/2020 8:55 AM

## 2020-06-26 NOTE — Care Management Important Message (Signed)
Important Message  Patient Details  Name: [name] MRN: [id number] Date of Birth: 1946/01/18   Medicare Important Message Given:  Yes     Dannette Barbara 06/26/2020, 1:32 PM

## 2020-06-26 NOTE — Progress Notes (Signed)
PROGRESS NOTE    Patrick [name]  DQQ:[id number] DOB: [birth year] DOA: 06/20/2020 PCP: Idelle Crouch, MD   Brief Narrative:  Patrick [name] is a 74 y.o. M with hx. DM, HTN, A. Fib not on anticoagulation [name] to thrombocytopenia and history of GI bleed requiring transfusion from suspected AVM, CKD IIIa, combined CHF, moderate aortic stenosis and cirrhosis who presented with one week of worsening shortness of breath and severe leg swelling. In the ER, chest x-ray showed congestion, legs were severely swollen.  He was started on IV diuretics and admitted to the hospitalist service.  He is admitted for acute on chronic systolic and diastolic CHF. He reports feeling better, cardiology recommended reduce the dose of atenolol given bradycardia and lasix [name] to worsening renal function.  Patient is making slow improvement,  nephrology consulted [name] to the worsening renal functions.  Renal functions are improving,  patient continues to have anemia and thrombocytopenia. Hem/ Onco consulted.   Assessment & Plan:   Principal Problem:   Acute on chronic combined systolic and diastolic CHF (congestive heart failure) (HCC) Active Problems:   Diabetes (HCC)   Thrombocytopenia (HCC)   Chronic kidney disease, stage 3a   Iron deficiency anemia [name] to chronic blood loss   Chronic a-fib (HCC)   Benign essential HTN   Moderate aortic stenosis   History of GI bleed   Acute CHF (congestive heart failure) (HCC)   Acute on chronic diastolic CHF (congestive heart failure) (HCC)   Acute on Chronic systolic and diastolic CHF Moderate aortic stenosis Admitted and started on Lasix 40 IV, prompt urine output so far, about 1L.   -EF 40-45% last month.  This is new reduced EF last month, evaluated by primary Cardiologist at that time, deferred GDMT or ischemic work up.   -Reduce  Furosemide 20 mg IV twice a day. -K supplement -Strict I/Os, daily weights, telemetry  -Daily monitoring renal function, renal  functions started to trend down. -hold low dose lisinopril for HFrEF and monitor BP and Creatinine. -Continue atenolol but at lower dose [name] to bradycardia and low BP. - Reports feeling better, still has significant edema.    Iron deficiency anemia [name] to chronic blood loss. History of GI bleed with AVM Hgb dropped to 7.8 today.  No clinical bleeding at this point to warrant GI evaluation.  Known AVMs.   Did require 2 units transfusion last hospital stay. -Hgb remained stable above 8. -Transfusion threashold 8 g/dL given HF -Patient received erythropoietin x 1,   Hematology consulted will follow up recommendation.  Thrombocytopenia - stable.   Alcoholic liver cirrhosis - stable.  Diabetes   Glucose controlled -Continue SS corrections -COntinue DPPIV -Hold metformin  Chronic kidney disease IIIa Cr is near basleine with eGFR high 40s. Serum creatinine trending up because of Lasix, with reduced Lasix to 20 mg twice daily, renal functions started to show improvement. Monitor renal functions.  Nephrology consulted, Recommended to continue current management.  Chronic atrial fibrillation Not anticoagulation candidate [name] to chronic GI bleeding from AVMs and thrombocytopenia. HR controlled/low. -Continue atenolol  Hypertension -Continue atenolol, spironolactone and start lisinopril   COPD -Continue Symbicort as formulary alternative     DVT prophylaxis: SCDs Code Status: Full  Family Communication: No one at bedside, discussed with patient in detail. Disposition Plan:  Dispo: The patient is from: Home  Anticipated d/c is to: Home  Anticipated d/c date is: 1-2 days  Patient currently is not medically stable to d/c.ongoing treatment for CHF.  Consultants:   Procedures:  Antimicrobials:  Anti-infectives (From admission, onward)   None      Subjective: Patient was seen and examined at bedside.  No overnight events,  Patient appears much better, denies any shortness of breath, leg swelling is much better. He doesn't wants to be discharged today.  Objective: Vitals:   06/25/20 2000 06/26/20 0348 06/26/20 0746 06/26/20 1128  BP: (!) 134/56 (!) 116/51 (!) 127/56 (!) 105/46  Pulse: 65 (!) 41 (!) 52 (!) 50  Resp: '20  16 16  ' Temp: 97.7 F (36.5 C) 98.2 F (36.8 C) 98.4 F (36.9 C) 98.8 F (37.1 C)  TempSrc: Oral Oral Oral Oral  SpO2: 100% 96% 98% 97%  Weight:      Height:        Intake/Output Summary (Last 24 hours) at 06/26/2020 1422 Last data filed at 06/26/2020 1300 Gross per 24 hour  Intake 240 ml  Output 2000 ml  Net -1760 ml   Filed Weights   06/23/20 0430 06/24/20 0450 06/25/20 0457  Weight: 111.2 kg 110.4 kg 108 kg    Examination:  General exam: Appears calm and comfortable  Respiratory system: Clear to auscultation. Respiratory effort normal. Cardiovascular system: S1 & S2 heard, RRR. No JVD, murmurs, rubs, gallops or clicks. No pedal edema. Gastrointestinal system: Abdomen is nondistended, soft and nontender. No organomegaly or masses felt. Normal bowel sounds heard. Central nervous system: Alert and oriented. No focal neurological deficits. Extremities:  Bilateral Leg edema. Skin: No rashes, lesions or ulcers Psychiatry: Judgement and insight appear normal. Mood & affect appropriate.     Data Reviewed: I have personally reviewed following labs and imaging studies  CBC: Recent Labs  Lab 06/21/20 0445 06/21/20 0940 06/22/20 0450 06/23/20 0621 06/24/20 0610 06/25/20 0442 06/26/20 0512  WBC 5.3  --  5.0 5.4  --  2.4* 2.0*  HGB 8.0*   < > 8.0* 8.1* 8.4* 7.8* 8.0*  HCT 25.0*   < > 24.3* 25.3* 25.1* 23.8* 24.0*  MCV 92.6  --  89.3 91.7  --  89.8 89.6  PLT 68*  --  65* 70*  --  46* 46*   < > = values in this interval not displayed.   Basic Metabolic Panel: Recent Labs  Lab 06/22/20 0450 06/23/20 0621 06/24/20 0610 06/25/20 0442 06/26/20 0512  NA 139 141 138 133*  136  K 4.6 5.1 5.1 4.2 4.2  CL 105 105 103 98 99  CO2 '27 26 27 29 30  ' GLUCOSE 88 90 92 92 87  BUN 44* 50* 50* 41* 38*  CREATININE 2.18* 2.22* 1.97* 1.75* 1.62*  CALCIUM 8.4* 8.8* 8.5* 8.6* 8.6*  MG 1.9  --  2.1  --  1.6*  PHOS 5.3*  --  4.7*  --  3.6   GFR: Estimated Creatinine Clearance: 48.4 mL/min (A) (by C-G formula based on SCr of 1.62 mg/dL (H)). Liver Function Tests: Recent Labs  Lab 06/22/20 0450  AST 25  ALT 13  ALKPHOS 58  BILITOT 1.3*  PROT 7.0  ALBUMIN 3.1*   No results for input(s): LIPASE, AMYLASE in the last 168 hours. No results for input(s): AMMONIA in the last 168 hours. Coagulation Profile: No results for input(s): INR, PROTIME in the last 168 hours. Cardiac Enzymes: No results for input(s): CKTOTAL, CKMB, CKMBINDEX, TROPONINI in the last 168 hours. BNP (last 3 results) No results for input(s): PROBNP in the last 8760 hours. HbA1C: No results for input(s): HGBA1C in the last 72 hours.  CBG: Recent Labs  Lab 06/25/20 1148 06/25/20 1731 06/25/20 2109 06/26/20 0749 06/26/20 1128  GLUCAP 102* 104* 94 88 105*   Lipid Profile: No results for input(s): CHOL, HDL, LDLCALC, TRIG, CHOLHDL, LDLDIRECT in the last 72 hours. Thyroid Function Tests: No results for input(s): TSH, T4TOTAL, FREET4, T3FREE, THYROIDAB in the last 72 hours. Anemia Panel: Recent Labs    06/24/20 0610 06/24/20 1548  FOLATE 32.0  --   FERRITIN 69  --   TIBC 385  --   IRON 83  --   RETICCTPCT  --  1.6   Sepsis Labs: No results for input(s): PROCALCITON, LATICACIDVEN in the last 168 hours.  Recent Results (from the past 240 hour(s))  SARS Coronavirus 2 by RT PCR (hospital order, performed in [facility] hospital lab) Nasopharyngeal Nasopharyngeal Swab     Status: None   Collection Time: 06/21/20  8:18 AM   Specimen: Nasopharyngeal Swab  Result Value Ref Range Status   SARS Coronavirus 2 NEGATIVE NEGATIVE Final    Comment: (NOTE) SARS-CoV-2 target nucleic acids are NOT  DETECTED.  The SARS-CoV-2 RNA is generally detectable in upper and lower respiratory specimens during the acute phase of infection. The lowest concentration of SARS-CoV-2 viral copies this assay can detect is 250 copies / mL. A negative result does not preclude SARS-CoV-2 infection and should not be used as the sole basis for treatment or other patient management decisions.  A negative result may occur with improper specimen collection / handling, submission of specimen other than nasopharyngeal swab, presence of viral mutation(s) within the areas targeted by this assay, and inadequate number of viral copies (<250 copies / mL). A negative result must be combined with clinical observations, patient history, and epidemiological information.  Fact Sheet for Patients:   StrictlyIdeas.no  Fact Sheet for Healthcare Providers: BankingDealers.co.za  This test is not yet approved or  cleared by the [place] FDA and has been authorized for detection and/or diagnosis of SARS-CoV-2 by FDA under an Emergency Use Authorization (EUA).  This EUA will remain in effect (meaning this test can be used) for the duration of the COVID-19 declaration under Section 564(b)(1) of the Act, 21 U.S.C. section 360bbb-3(b)(1), unless the authorization is terminated or revoked sooner.  Performed at [facility], [address], [place], [place] [address]     Radiology Studies: No results found.  Scheduled Meds: . atenolol  12.5 mg Oral Daily  . epoetin (EPOGEN/PROCRIT) injection  3,000 Units Subcutaneous Q14 Days  . fluticasone furoate-vilanterol  1 puff Inhalation Daily  . furosemide  20 mg Intravenous Q12H  . insulin aspart  0-15 Units Subcutaneous TID WC  . insulin aspart  0-5 Units Subcutaneous QHS  . linagliptin  5 mg Oral Daily  . melatonin  2.5 mg Oral QHS  . sodium chloride flush  3 mL Intravenous Q12H  . spironolactone  50 mg Oral  Daily   Continuous Infusions: . sodium chloride       LOS: 6 days    Time spent: 25 mins    Shawna Clamp, MD [facility]   If 7PM-7AM, please contact night-coverage

## 2020-06-26 NOTE — Assessment & Plan Note (Addendum)
#  74 year old male patient with multiple medical problems-with iron deficient anemia; cirrhosis hypersplenism/thrombocytopenia is currently admitted hospital for acute on chronic congestive heart failure noted to have worsening thrombocytopenia  #Thrombocytopenia-Baseline 70s; secondary cirrhosis/alcohol.  Most recently platelets are in the 40s-50-secondary to acute decompensation of congestive heart failure.  Overall stable.  #Severe symptomatic anemia-multifactorial secondary to cirrhosis liver disease/Iron deficiency anemia/folate deficiency likely from history of GI bleed/AVMs/CKD stage III-hemoglobin hemoglobin 7-8.  We will plan EPO agent as out patient also.   #Leukopenia white count 2.0-differential unavailable.  Again suspect secondary cirrhosis.  Chronic intermittent.  STABLE.   #Acute on chronic CHF-on Lasix/diuresis.  -6 L.  Breathing improved.  Defer to cardiology.

## 2020-06-26 NOTE — Progress Notes (Signed)
   Heart Failure Nurse Navigator Note   Upon entering room patient found eating [employer] sandwich that contained mayonaise, ham and [place].  Was also drinking a large coke and had a bag of barbeque potato chips.   He states that his lunch tray had nothing on it that he wanted to eat as it tasted bland.  His daughter is present at the bedside.  Discussed with patient with his heart failure it is important that he follow with a low sodium diet, he states that his doctor has not placed him on one.  He also does not have a fluid restriction.  He states that through out the day he drinks a lot of water.  He has a scale but does not weigh himself daily, he said that his home health nurse has also suggested he do that.  Went on to explain to patient if he weighed himself daily and noted a 2 to 3 pound weight gain over night or gained 5 pounds within a week that he should call his doctor for instructions and  could avoid being admitted to the hospital.  His last hospitalization was 3 weeks ago with the same symptoms.  He does the cooking for his wife and himself.  He uses salt with cooking and adds it at the table.  Suggested he try Mrs Deliah Boston for seasoning.  Discussed that I would be following up with him tomorrow to go over what we had discussed today.     Pricilla Riffle RN, CHFN

## 2020-06-27 ENCOUNTER — Telehealth: Payer: Self-pay | Admitting: Internal Medicine

## 2020-06-27 ENCOUNTER — Other Ambulatory Visit: Payer: Self-pay | Admitting: Internal Medicine

## 2020-06-27 DIAGNOSIS — D631 Anemia in chronic kidney disease: Secondary | ICD-10-CM

## 2020-06-27 DIAGNOSIS — D696 Thrombocytopenia, unspecified: Secondary | ICD-10-CM

## 2020-06-27 DIAGNOSIS — N1832 Chronic kidney disease, stage 3b: Secondary | ICD-10-CM | POA: Insufficient documentation

## 2020-06-27 LAB — CBC
HCT: 24.3 % — ABNORMAL LOW (ref 39.0–52.0)
Hemoglobin: 7.8 g/dL — ABNORMAL LOW (ref 13.0–17.0)
MCH: 28.9 pg (ref 26.0–34.0)
MCHC: 32.1 g/dL (ref 30.0–36.0)
MCV: 90 fL (ref 80.0–100.0)
Platelets: 52 10*3/uL — ABNORMAL LOW (ref 150–400)
RBC: 2.7 MIL/uL — ABNORMAL LOW (ref 4.22–5.81)
RDW: 18.1 % — ABNORMAL HIGH (ref 11.5–15.5)
WBC: 2.1 10*3/uL — ABNORMAL LOW (ref 4.0–10.5)
nRBC: 0 % (ref 0.0–0.2)

## 2020-06-27 LAB — BASIC METABOLIC PANEL
Anion gap: 10 (ref 5–15)
BUN: 32 mg/dL — ABNORMAL HIGH (ref 8–23)
CO2: 28 mmol/L (ref 22–32)
Calcium: 8.1 mg/dL — ABNORMAL LOW (ref 8.9–10.3)
Chloride: 98 mmol/L (ref 98–111)
Creatinine, Ser: 1.41 mg/dL — ABNORMAL HIGH (ref 0.61–1.24)
GFR calc Af Amer: 56 mL/min — ABNORMAL LOW (ref >60)
GFR calc non Af Amer: 49 mL/min — ABNORMAL LOW (ref >60)
Glucose, Bld: 88 mg/dL (ref 70–99)
Potassium: 4.2 mmol/L (ref 3.5–5.1)
Sodium: 136 mmol/L (ref 135–145)

## 2020-06-27 LAB — GLUCOSE, CAPILLARY
Glucose-Capillary: 89 mg/dL (ref 70–99)
Glucose-Capillary: 100 mg/dL — ABNORMAL HIGH (ref 70–99)

## 2020-06-27 MED ORDER — EPOETIN ALFA 4000 UNIT/ML IJ SOLN
3000.0000 [IU] | INTRAMUSCULAR | 3 refills | Status: AC
Start: 2020-07-09 — End: ?

## 2020-06-27 MED ORDER — FUROSEMIDE 40 MG PO TABS: 40.0000 mg | ORAL_TABLET | Freq: Two times a day (BID) | ORAL | Status: DC

## 2020-06-27 NOTE — Progress Notes (Signed)
[facility]  ROUNDING NOTE   Subjective:   Patient resting in bed, in no acute distress, reports 'feeling little tired'.Denies SOB,nausea and vomiting.   Objective:  Vital signs in last 24 hours:  Temp:  [98.4 F (36.9 C)-98.7 F (37.1 C)] 98.4 F (36.9 C) (08/31 1157) Pulse Rate:  [49-60] 58 (08/31 1157) Resp:  [17-18] 18 (08/31 1157) BP: (110-125)/(47-54) 125/47 (08/31 1157) SpO2:  [97 %-99 %] 97 % (08/31 1157) Weight:  [103 kg] 103 kg (08/31 0349)  Weight change:  Filed Weights   06/24/20 0450 06/25/20 0457 06/27/20 0349  Weight: 110.4 kg 108 kg 103 kg    Intake/Output: I/O last 3 completed shifts: In: 0  Out: 3900 [Urine:3900]   Intake/Output this shift:  Total I/O In: 240 [P.O.:240] Out: 2000 [Urine:2000]  Physical Exam: General: No acute distress,resting in bed  Head:  Moist oral mucosal membranes,nasal canula with 2L O2  Eyes: Anicteric  Neck: Supple  Lungs:  Normal respiratory effort,Lungs clear to auscultation  Heart: S1S2, Regular rate and rhythm  Abdomen:  Soft, nontender, non distended  Extremities:  No peripheral edema.  Neurologic: Alert, oriented   Skin: No acute rashes or  lesions        Basic Metabolic Panel: Recent Labs  Lab 06/22/20 0450 06/22/20 0450 06/23/20 0621 06/23/20 0621 06/24/20 0610 06/24/20 0610 06/25/20 0442 06/26/20 0512 06/27/20 0408  NA 139   < > 141  --  138  --  133* 136 136  K 4.6   < > 5.1  --  5.1  --  4.2 4.2 4.2  CL 105   < > 105  --  103  --  98 99 98  CO2 27   < > 26  --  27  --  29 30 28   GLUCOSE 88   < > 90  --  92  --  92 87 88  BUN 44*   < > 50*  --  50*  --  41* 38* 32*  CREATININE 2.18*   < > 2.22*  --  1.97*  --  1.75* 1.62* 1.41*  CALCIUM 8.4*   < > 8.8*   < > 8.5*   < > 8.6* 8.6* 8.1*  MG 1.9  --   --   --  2.1  --   --  1.6*  --   PHOS 5.3*  --   --   --  4.7*  --   --  3.6  --    < > = values in this interval not displayed.    Liver Function Tests: Recent Labs  Lab  06/22/20 0450  AST 25  ALT 13  ALKPHOS 58  BILITOT 1.3*  PROT 7.0  ALBUMIN 3.1*   No results for input(s): LIPASE, AMYLASE in the last 168 hours. No results for input(s): AMMONIA in the last 168 hours.  CBC: Recent Labs  Lab 06/22/20 0450 06/22/20 0450 06/23/20 0621 06/24/20 0610 06/25/20 0442 06/26/20 0512 06/27/20 0408  WBC 5.0  --  5.4  --  2.4* 2.0*  2.0* 2.1*  NEUTROABS  --   --   --   --   --  0.7*  --   HGB 8.0*   < > 8.1* 8.4* 7.8* 8.0*  8.0* 7.8*  HCT 24.3*   < > 25.3* 25.1* 23.8* 24.8*  24.0* 24.3*  MCV 89.3  --  91.7  --  89.8 90.5  89.6 90.0  PLT 65*  --  70*  --  46* 45*  46* 52*   < > = values in this interval not displayed.    Cardiac Enzymes: No results for input(s): CKTOTAL, CKMB, CKMBINDEX, TROPONINI in the last 168 hours.  BNP: Invalid input(s): POCBNP  CBG: Recent Labs  Lab 06/26/20 1128 06/26/20 1724 06/26/20 2048 06/27/20 0801 06/27/20 1158  GLUCAP 105* 100* 93 89 100*    Microbiology: Results for orders placed or performed during the hospital encounter of 06/20/20  SARS Coronavirus 2 by RT PCR (hospital order, performed in [facility] hospital lab) Nasopharyngeal Nasopharyngeal Swab     Status: None   Collection Time: 06/21/20  8:18 AM   Specimen: Nasopharyngeal Swab  Result Value Ref Range Status   SARS Coronavirus 2 NEGATIVE NEGATIVE Final    Comment: (NOTE) SARS-CoV-2 target nucleic acids are NOT DETECTED.  The SARS-CoV-2 RNA is generally detectable in upper and lower respiratory specimens during the acute phase of infection. The lowest concentration of SARS-CoV-2 viral copies this assay can detect is 250 copies / mL. A negative result does not preclude SARS-CoV-2 infection and should not be used as the sole basis for treatment or other patient management decisions.  A negative result may occur with improper specimen collection / handling, submission of specimen other than nasopharyngeal swab, presence of viral  mutation(s) within the areas targeted by this assay, and inadequate number of viral copies (<250 copies / mL). A negative result must be combined with clinical observations, patient history, and epidemiological information.  Fact Sheet for Patients:   StrictlyIdeas.no  Fact Sheet for Healthcare Providers: BankingDealers.co.za  This test is not yet approved or  cleared by the [place] FDA and has been authorized for detection and/or diagnosis of SARS-CoV-2 by FDA under an Emergency Use Authorization (EUA).  This EUA will remain in effect (meaning this test can be used) for the duration of the COVID-19 declaration under Section 564(b)(1) of the Act, 21 U.S.C. section 360bbb-3(b)(1), unless the authorization is terminated or revoked sooner.  Performed at [facility], [address] [place], [place] [address]     Coagulation Studies: No results for input(s): LABPROT, INR in the last 72 hours.  Urinalysis: No results for input(s): COLORURINE, LABSPEC, PHURINE, GLUCOSEU, HGBUR, BILIRUBINUR, KETONESUR, PROTEINUR, UROBILINOGEN, NITRITE, LEUKOCYTESUR in the last 72 hours.  Invalid input(s): APPERANCEUR    Imaging: No results found.   Medications:    sodium chloride      atenolol  12.5 mg Oral Daily   epoetin (EPOGEN/PROCRIT) injection  3,000 Units Subcutaneous Q14 Days   fluticasone furoate-vilanterol  1 puff Inhalation Daily   furosemide  40 mg Oral BID   insulin aspart  0-15 Units Subcutaneous TID WC   insulin aspart  0-5 Units Subcutaneous QHS   linagliptin  5 mg Oral Daily   melatonin  2.5 mg Oral QHS   simvastatin  40 mg Oral q1800   sodium chloride flush  3 mL Intravenous Q12H   spironolactone  50 mg Oral Daily   thiamine  100 mg Oral Daily   sodium chloride, acetaminophen, oxyCODONE, promethazine, sodium chloride flush  Assessment/ Plan:  [name] is a 74 y.o. white male with atrial  fibrillation, valvular heart disease, hypertension, coronary artery disease, pulmonary hypertension, diabetes mellitus type II, hyperlipidemia, who is admitted to [facility] on 06/20/2020 for Anemia due to chronic blood loss [D50.0] Acute CHF (congestive heart failure) ([place]) [I50.9] Acute on chronic combined systolic and diastolic congestive heart failure ([place]) [I50.43]  Acute on chronic diastolic CHF (congestive heart failure) ([place]) [I50.33]   1. Acute renal failure on chronic kidney disease stage IIIB: with bland urine. Baseline creatinine of 1.5, GFR of 46 on 7/29.  Chronic kidney disease secondary to hypertension and NSAIDs (takes nabumetone at home) Acute renal failure secondary to acute cardiorenal syndrome.  Cr 1.4 today,K+4.2 Will change Lasix to PO   2. Hypertension with acute exacerbation on  Chronic systolic and diastolic congestive heart failure. Echocardiogram on 05/06/20 Home regimen is furosemide, spironolactone, and atenolol Will change  IV furosemide to PO   3. Diabetes mellitus type II with chronic kidney disease: takes metformin as outpatient. Blood sugar within normal range today.  4. Hypokalemia:  K+4.2 today  5. Anemia with renal failure and thrombocytopenia Hgb 7.8 today Platelets 52 today,will continue monitoring   Patient was seen and examined with Crosby Oyster, DNP. Patient will need outpatient follow up with nephrology. Above plan was discussed and agreed upon on the signing of this note.   Lavonia Dana , MD [facility]  8/31/20213:54 PM    LOS: 7 Jennea Rager 8/31/20212:51 PM

## 2020-06-27 NOTE — Progress Notes (Signed)
   Heart Failure Nurse Navigator Note:  Follow up visit to teaching started yesterday.  Wife is present today.  With wife discussed limiting sodium intake to less than 2000 mg in 24 hours and also limiting fluid intake.  Patient is adamant that he can drink as much liquid as he wants and that eating ham subs is also good for him.  Explained that foods that are high in sodium are definitely not his best choice.  Talked with the wife about eating fresh and frozen vegetables and fruits, if having to use canned make sure to rinse well.  Stay away from convenience foods, ie.  TV dinners, processed meat, canned soups.  Wife is asking about examples of low sodium meal plans and they do not have a computer.  Discussed with Karn Pickler RN, patient's nurse she will give them print outs of dietary examples.  Pricilla Riffle RN, CHFN

## 2020-06-27 NOTE — TOC Initial Note (Signed)
Transition of Care [facility]) - Initial/Assessment Note    Patient Details  Name: Patrick [name] MRN: [id number] Date of Birth: 03-31-1946  Transition [facility] CM/SW Contact:    Eileen Stanford, LCSW Phone Number: 06/27/2020, 11:35 AM  Clinical Narrative:  Pt lives at home with spouse. Pt is active with [employer]. Pt will resume with [employer]. Pt also will have 02 delivered at bedside via [employer].                 Expected Discharge Plan: [place] Barriers to Discharge: No Barriers Identified   Patient Goals and CMS Choice Patient states their goals for this hospitalization and ongoing recovery are:: to get better      Expected Discharge Plan and Services Expected Discharge Plan: [place] In-house Referral: Clinical Social Work   Post Acute Care Choice: [place] arrangements for the past 2 months: Single Family Home                 DME Arranged: Oxygen DME Agency: [employer] Date DME Agency Contacted: 06/27/20 Time DME Agency Contacted: [id number] Representative spoke with at DME Agency: Thedore Mins HH Arranged: PT, OT, RN [place] Agency: [facility] Date [place]: 06/27/20 Time [place]: 6 Representative spoke with at [place]: [place] Arrangements/Services Living arrangements for the past 2 months: [place] with:: Spouse Patient language and need for interpreter reviewed:: Yes Do you feel safe going back to the place where you live?: Yes      Need for Family Participation in Patient Care: Yes (Comment) Care giver support system in place?: Yes (comment) Current home services: Home PT, Home RN Criminal Activity/Legal Involvement Pertinent to Current Situation/Hospitalization: No - Comment as needed  Activities of Daily Living Home Assistive Devices/Equipment: Eyeglasses, Environmental consultant (specify type), CBG Meter, Dentures (specify type) ADL Screening (condition at time of  admission) Patient's cognitive ability adequate to safely complete daily activities?: Yes Is the patient deaf or have difficulty hearing?: No Does the patient have difficulty seeing, even when wearing glasses/contacts?: No Does the patient have difficulty concentrating, remembering, or making decisions?: No Patient able to express need for assistance with ADLs?: Yes Does the patient have difficulty dressing or bathing?: Yes Independently performs ADLs?: Yes (appropriate for developmental age) Does the patient have difficulty walking or climbing stairs?: Yes Weakness of Legs: Both Weakness of Arms/Hands: Right (shoulder)  Permission Sought/Granted Permission sought to share information with : Family Supports    Share Information with NAME: Terrence Dupont  Permission granted to share info w AGENCY: [employer]  Permission granted to share info w Relationship: Spouse     Emotional Assessment Appearance:: Appears stated age Attitude/Demeanor/Rapport: Engaged Affect (typically observed): Accepting Orientation: : Oriented to Place, Oriented to Self, Oriented to  Time, Oriented to Situation Alcohol / Substance Use: Not Applicable Psych Involvement: No (comment)  Admission diagnosis:  Anemia due to chronic blood loss [D50.0] Acute CHF (congestive heart failure) (HCC) [I50.9] Acute on chronic combined systolic and diastolic congestive heart failure (HCC) [I50.43] Acute on chronic diastolic CHF (congestive heart failure) ([place]) [I50.33] Patient Active Problem List   Diagnosis Date Noted  . Acute on chronic combined systolic and diastolic CHF (congestive heart failure) ([place]) 06/20/2020  . Chronic kidney disease, stage 3a 06/20/2020  . Iron deficiency anemia due to chronic blood loss 06/20/2020  . Moderate aortic stenosis 06/20/2020  . History of GI bleed 06/20/2020  . Acute CHF (  congestive heart failure) ([place]) 06/20/2020  . Acute on chronic diastolic CHF (congestive heart failure) ([place]) 06/20/2020  .  GIB (gastrointestinal bleeding) 05/05/2020  . Chronic a-fib ([place]) 07/14/2019  . Pleural effusion on left 06/08/2018  . Alcoholic cirrhosis of liver with ascites ([place]) 06/08/2018  . Benign essential HTN 03/03/2018  . Thrombocytopenia ([place]) 02/06/2018  . Fecal occult blood test positive 09/08/2014  . Alcohol drinker 09/08/2014  . Absolute anemia 04/15/2014  . 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 Diabetes ([place]) 04/15/2014  . Acid reflux 04/15/2014  . 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 HLD (hyperlipidemia) 04/15/2014  . Arthritis, degenerative 04/15/2014   PCP:  Idelle Crouch, MD Pharmacy:   [facility], [place] [address] Phone: [phone] Fax: [phone]     Social Determinants of Health (SDOH) Interventions    Readmission Risk Interventions No flowsheet data found.

## 2020-06-27 NOTE — Progress Notes (Signed)
Discharge instructions explained/pt verbalized understanding. IV and tele removed. Pt transported off unit via wheelchair.

## 2020-06-27 NOTE — Discharge Summary (Signed)
Physician Discharge Summary  Patrick [name] JHE:[id number] DOB: [birth year] DOA: 06/20/2020  PCP: Patrick Crouch, MD  Admit date: 06/20/2020  Discharge date: 06/27/2020  Admitted From:  Home  Disposition:  Home with home services.  Recommendations for Outpatient Follow-up:  Follow up with PCP in 1-2 weeks. Please obtain BMP/CBC in one week. Advised to follow-up with cardiology Patrick [name]  in 1 week. Patient atenolol has been discontinued due to bradycardia. Advised to follow-up with oncology Dr. Rogue [name] in 1 to 2 weeks for anemia and thrombocytopenia. Advised to follow-up with nephrology.Patient has been prescribed erythropoietin twice a month. Patient has been discharged home on 2 L of supplemental oxygen via nasal cannula.  Home Health: Yes.  Equipment/Devices: Home Oxygen, RN, PT/ OT   Discharge Condition: Stable CODE STATUS:Full code Diet recommendation: Heart Healthy.  Brief Summary / Hospital course: Patrick [name] is a 74 y.o. M with hx. DM, HTN, A. Fib not on anticoagulation due to thrombocytopenia and history of GI bleed requiring transfusion from suspected AVM, CKD IIIa, combined CHF, moderate aortic stenosis and cirrhosis who presented with one week of worsening shortness of breath and severe leg swelling. In the ER, chest x-ray showed congestion, legs were severely swollen.  He was started on IV diuretics and admitted to the hospitalist service.  He was admitted for acute on chronic systolic and diastolic CHF. He reports feeling better after adequate diuresis.  He is 6 L negative balance.  Cardiology was consulted recommended reduce the dose of atenolol given bradycardia and lasix due to worsening renal function.  Nephrology was consulted due to increase in serum creatinine.  Patient is making slow improvement, with decreasing Lasix dose serum creatinine has significantly improved to the baseline.  Patient continues to have anemia and thrombocytopenia. Hem/ Onco  consulted.  Patient does have a history of cirrhosis that could be contributing to her anemia and thrombocytopenia.  Patient has been very noncompliant with regards to medication and follow-ups.  Patient's atenolol was completely discontinued given ongoing asymptomatic bradycardia.  Patient is cleared from nephrology and cardiology to be discharged.  Patient will follow up with hematology as an outpatient.  Patient has ambulated with physical therapy and does qualify home oxygen therapy.  Home health services and home oxygen has been arranged.  Patient has been discharged home.   He was managed for below problems.  Discharge Diagnoses:  Principal Problem:   Acute on chronic combined systolic and diastolic CHF (congestive heart failure) (HCC) Active Problems:   Diabetes (HCC)   Thrombocytopenia (HCC)   Chronic kidney disease, stage 3a   Iron deficiency anemia due to chronic blood loss   Chronic a-fib (HCC)   Benign essential HTN   Moderate aortic stenosis   History of GI bleed   Acute CHF (congestive heart failure) (HCC)   Acute on chronic diastolic CHF (congestive heart failure) (HCC)  Acute on Chronic systolic and diastolic CHF Moderate aortic stenosis Admitted and started on Lasix 40 IV, prompt urine output so far, about 1L.  -EF 40-45% last month.  This is new reduced EF last month, evaluated by primary Cardiologist at that time, deferred GDMT or ischemic work up.   -Reduce  Furosemide 20 mg IV twice a day. -K supplements. -Strict I/Os, daily weights, telemetry  -Daily monitoring renal function, renal functions started to trend down. -hold low dose lisinopril for HFrEF and monitor BP and Creatinine. -Continue atenolol but at lower dose due to bradycardia and low BP. - Reports feeling better, still has  significant edema.     Iron deficiency anemia due to chronic blood loss. History of GI bleed with AVM Hgb dropped to 7.8 today.  No clinical bleeding at this point to warrant GI  evaluation.  Known AVMs.   Did require 2 units transfusion last hospital stay. -Hgb remained stable above 8. -Transfusion threashold 8 g/dL given HF -Patient received erythropoietin x 1,    - Hematology consulted, states anemia thrombocytopenia could be secondary to liver cirrhosis , recommended outpatient follow-up.   Thrombocytopenia - stable.    Alcoholic liver cirrhosis - stable.   Diabetes   Glucose controlled -Continue SS corrections -COntinue DPPIV -Hold metformin   Chronic kidney disease IIIa Cr is near basleine with eGFR high 40s. Serum creatinine trending up because of Lasix, with reduced Lasix to 20 mg twice daily, renal functions started to show improvement. Monitor renal functions.  Nephrology consulted, Recommended to continue current management.   Chronic atrial fibrillation Not anticoagulation candidate due to chronic GI bleeding from AVMs and thrombocytopenia. HR controlled/low. -Continue atenolol   Hypertension -Continue atenolol, spironolactone and start lisinopril    COPD -Continue Symbicort as formulary alternative  Discharge Instructions  Discharge Instructions     Call MD for:  difficulty breathing, headache or visual disturbances   Complete by: As directed    Call MD for:  persistant dizziness or light-headedness   Complete by: As directed    Call MD for:  persistant nausea and vomiting   Complete by: As directed    Call MD for:  redness, tenderness, or signs of infection (pain, swelling, redness, odor or green/yellow discharge around incision site)   Complete by: As directed    Call MD for:  temperature >100.4   Complete by: As directed    Diet - low sodium heart healthy   Complete by: As directed    Diet Carb Modified   Complete by: As directed    Discharge instructions   Complete by: As directed    Advised to follow up PCP in one week. Advised to follow-up with cardiology Patrick [name] in 1 week. Patient atenolol has been discontinued due  to bradycardia. Advised to follow-up with oncology Dr. Rogue [name] in 1 to 2 weeks for anemia and thrombocytopenia. Advised to follow-up with nephrology patient has been prescribed erythropoietin twice a month.   Increase activity slowly   Complete by: As directed       Allergies as of 06/27/2020       Reactions   Aspirin Other (See Comments)   GI Bleed   Dextrose Other (See Comments)   PATIENT CAN NOT TAKE BLOOD THINNERS OF ANY TYPE         Medication List     STOP taking these medications    atenolol 25 MG tablet Commonly known as: TENORMIN   magnesium oxide 400 (241.3 Mg) MG tablet Commonly known as: MAG-OX   nabumetone 500 MG tablet Commonly known as: RELAFEN   protein supplement shake Liqd Commonly known as: PREMIER PROTEIN   torsemide 20 MG tablet Commonly known as: DEMADEX       TAKE these medications    acetaminophen 500 MG tablet Commonly known as: TYLENOL Take 500 mg by mouth every 8 (eight) hours as needed.   cyanocobalamin 1000 MCG tablet Take 1,000 mcg by mouth daily at 6 (six) AM.   docusate sodium 100 MG capsule Commonly known as: COLACE Take 1 capsule (100 mg total) by mouth 2 (two) times daily.   epoetin alfa 4000  UNIT/ML injection Commonly known as: EPOGEN Inject 0.75 mLs (3,000 Units total) into the skin every 14 (fourteen) days. Start taking on: July 09, 7892   folic acid 1 MG tablet Commonly known as: FOLVITE TAKE 1 TABLET BY MOUTH ONCE DAILY   furosemide 40 MG tablet Commonly known as: LASIX Take 1 tablet (40 mg total) by mouth 2 (two) times daily.   Janumet 50-1000 MG tablet Generic drug: sitaGLIPtin-metformin Take 1 tablet by mouth at bedtime.   magnesium oxide 400 MG tablet Commonly known as: MAG-OX Take 400 mg by mouth daily.   melatonin 5 MG Tabs Take 0.5 tablets (2.5 mg total) by mouth at bedtime.   multivitamin with minerals Tabs tablet Take 1 tablet by mouth daily.   pantoprazole 40 MG tablet Commonly  known as: PROTONIX Take 1 tablet (40 mg total) by mouth daily.   potassium chloride SA 20 MEQ tablet Commonly known as: KLOR-CON Take 1 tablet (20 mEq total) by mouth daily.   senna-docusate 8.6-50 MG tablet Commonly known as: Senokot-S Take 1 tablet by mouth at bedtime as needed for mild constipation.   simvastatin 40 MG tablet Commonly known as: ZOCOR Take 40 mg by mouth daily.   spironolactone 50 MG tablet Commonly known as: ALDACTONE Take 50 mg by mouth daily.   Symbicort 160-4.5 MCG/ACT inhaler Generic drug: budesonide-formoterol Inhale 2 puffs into the lungs 2 (two) times daily.   thiamine 100 MG tablet Take 1 tablet (100 mg total) by mouth daily.               Durable Medical Equipment  (From admission, onward)           Start     Ordered   06/27/20 1130  For home use only DME oxygen  Once       Question Answer Comment  Length of Need Lifetime   Mode or (Route) Nasal cannula   Liters per Minute 2   Frequency Continuous (stationary and portable oxygen unit needed)   Oxygen conserving device Yes   Oxygen delivery system Gas      06/27/20 1129            Follow-up Information     [facility] Follow up on 06/30/2020.   Specialty: Cardiology Why: at 10:00am. Enter through the [place] entrance Contact information: [place] [phone]        Patrick Crouch, MD Follow up in 1 week(s).   Specialty: Internal Medicine Contact information: [place] [address] [phone]         Corey Skains, MD Follow up in 1 week(s).   Specialty: Cardiology Contact information: [facility] [place] [address] [phone]         Cammie Sickle, MD Follow up in 2 week(s).   Specialties: Internal Medicine, Oncology Contact information: [place] [address] [phone]                Allergies  Allergen Reactions   Aspirin Other (See Comments)    GI Bleed   Dextrose Other (See Comments)    PATIENT CAN NOT TAKE BLOOD THINNERS OF ANY TYPE     Consultations: Cardiology, nephrology, hematology oncology.   Procedures/Studies: DG Chest 1 View  Result Date: 06/24/2020 CLINICAL DATA:  Congestive heart failure. EXAM: CHEST  1 VIEW COMPARISON:  June 19, 2020 FINDINGS: Bilateral pulmonary opacities, particularly in the bases have worsened in the interval. Stable cardiomegaly. The hila and mediastinum are unchanged. No pneumothorax. Probable left effusion. IMPRESSION: Increasing bibasilar pulmonary opacities may represent multifocal pneumonia versus asymmetric edema. Cardiomegaly and small left effusion. Electronically Signed   By: Dorise Bullion III M.D   On: 06/24/2020 16:13   DG Chest 2 View  Result Date: 06/19/2020 CLINICAL DATA:  Shortness of breath.  Leg swelling. EXAM: CHEST - 2 VIEW COMPARISON:  05/05/2020 FINDINGS: Mild wedging of a lower thoracic vertebral body is similar to the prior CT. Reverse apical lordotic positioning on the frontal radiograph. Remote upper left rib fractures. Patient rotated left. Mild cardiomegaly. Trace bilateral pleural fluid. No pneumothorax. Pulmonary venous congestion, as evidenced by pulmonary interstitial prominence and indistinctness. Similar left base pulmonary opacities and volume loss, favoring atelectasis and/or scar. IMPRESSION: Cardiomegaly with pulmonary venous congestion and trace bilateral pleural effusions. Electronically Signed   By: Abigail Miyamoto M.D.   On: 06/19/2020 16:57   US RENAL  Result Date: 06/24/2020 CLINICAL DATA:  ATN EXAM: RENAL / URINARY TRACT ULTRASOUND COMPLETE COMPARISON:  None. FINDINGS: Right Kidney: Renal measurements: 10.7 x 5.0 x 4.1 cm = volume: 115 mL. Echogenicity is increased. No mass or hydronephrosis visualized. Left Kidney:  Renal measurements: 10.1 x 4.8 x 5.4 cm = volume: 137 mL. Echogenicity is increased. No mass or hydronephrosis visualized. Bladder: Appears normal for degree of bladder distention. Other: None. IMPRESSION: Increased echogenicity of the bilateral kidneys as can be seen in medical renal disease. No hydronephrosis. Electronically Signed   By: Audie Pinto M.D.   On: 06/24/2020 16:09      Subjective: Patient was seen and examined at bedside.  No overnight events.  Patient has participated in physical therapy.   Patient O2 saturation has decreased, requires home oxygen.  Home health services has been arranged.   Patient is cleared from nephrology cardiology and hematology. Patient is being discharged home with home services.  Discharge Exam: Vitals:   06/27/20 0801 06/27/20 1157  BP: (!) 122/53 (!) 125/47  Pulse: (!) 57 (!) 49  Resp: 18 18  Temp: 98.7 F (37.1 C) 98.4 F (36.9 C)  SpO2: 98% 97%   Vitals:   06/26/20 2040 06/27/20 0349 06/27/20 0801 06/27/20 1157  BP: (!) 114/49 (!) 118/54 (!) 122/53 (!) 125/47  Pulse: 60 (!) 55 (!) 57 (!) 49  Resp: _0 Temp: 98.5 F (36.9 C) 98.7 F (37.1 C) 98.7 F (37.1 C) 98.4 F (36.9 C)  TempSrc: Oral Oral Oral Oral  SpO2: 99% 98% 98% 97%  Weight:  103 kg    Height:        General: Pt is alert, awake, not in acute distress Cardiovascular: RRR, S1/S2 +, no rubs, no gallops Respiratory: CTA bilaterally, no wheezing, no rhonchi Abdominal: Soft, NT, ND, bowel sounds + Extremities: no edema, no cyanosis    The results of significant diagnostics from this hospitalization (including imaging, microbiology, ancillary and laboratory) are listed below for reference.     Microbiology: Recent Results (from the past 240 hour(s))  SARS Coronavirus 2 by RT PCR (hospital order, performed in [facility] hospital lab) Nasopharyngeal Nasopharyngeal Swab     Status: None   Collection Time: 06/21/20  8:18 AM   Specimen: Nasopharyngeal Swab   Result Value Ref Range Status   SARS Coronavirus 2 NEGATIVE NEGATIVE Final    Comment: (NOTE) SARS-CoV-2 target nucleic acids are NOT DETECTED.  The SARS-CoV-2 RNA is generally detectable in upper and lower respiratory specimens during the acute phase of infection. The lowest concentration of SARS-CoV-2 viral copies this assay can detect is 250 copies / mL. A negative result does not preclude SARS-CoV-2 infection and should not be used as the sole basis for treatment or other patient management decisions.  A negative result may occur with improper specimen collection / handling, submission of specimen other than nasopharyngeal swab, presence of viral mutation(s) within the areas targeted by this assay, and inadequate number of viral copies (<250 copies / mL). A negative result must be combined with clinical observations, patient history, and epidemiological information.  Fact Sheet for Patients:   StrictlyIdeas.no  Fact Sheet for Healthcare Providers: BankingDealers.co.za  This test is not yet approved or  cleared by the [place] FDA and has been authorized for detection and/or diagnosis of SARS-CoV-2 by FDA under an Emergency Use Authorization (EUA).  This EUA will remain in effect (meaning this test can be used) for the duration of the COVID-19 declaration under Section 564(b)(1) of the Act, 21 U.S.C. section 360bbb-3(b)(1), unless the authorization is terminated or revoked sooner.  Performed at [facility], [address] [place], [place] [address]      Labs: BNP (last 3 results) Recent Labs    05/05/20 1705 06/19/20 1610  BNP 628.7* 761.9*   Basic Metabolic Panel: Recent Labs  Lab 06/22/20 0450 06/22/20 0450 06/23/20 0621 06/24/20 0610 06/25/20 0442 06/26/20 0512 06/27/20 0408  NA 139   < > 141 138 133* 136 136  K 4.6   < > 5.1 5.1 4.2 4.2 4.2  CL 105   < > 105 103 98 99 98  CO2 27   < > _0 GLUCOSE 88   < > 90 92 92 87 88  BUN 44*   < > 50* 50* 41* 38* 32*  CREATININE 2.18*   < > 2.22* 1.97* 1.75* 1.62* 1.41*  CALCIUM 8.4*   < > 8.8* 8.5* 8.6* 8.6* 8.1*  MG 1.9  --   --  2.1  --  1.6*  --   PHOS 5.3*  --   --  4.7*  --  3.6  --    < > = values in this interval not displayed.   Liver Function Tests: Recent Labs  Lab 06/22/20 0450  AST 25  ALT 13  ALKPHOS 58  BILITOT 1.3*  PROT 7.0  ALBUMIN 3.1*   No results for input(s): LIPASE, AMYLASE in the last 168 hours. No results for input(s): AMMONIA in the last 168 hours. CBC: Recent Labs  Lab 06/22/20 0450 06/22/20 0450 06/23/20 0621 06/24/20 0610 06/25/20 0442 06/26/20 0512 06/27/20 0408  WBC 5.0  --  5.4  --  2.4* 2.0*  2.0* 2.1*  NEUTROABS  --   --   --   --   --  0.7*  --   HGB 8.0*   < > 8.1* 8.4* 7.8* 8.0*  8.0* 7.8*  HCT 24.3*   < > 25.3* 25.1* 23.8* 24.8*  24.0* 24.3*  MCV 89.3  --  91.7  --  89.8 90.5  89.6 90.0  PLT 65*  --  70*  --  46* 45*  46* 52*   < > = values in this interval not displayed.   Cardiac Enzymes: No results for input(s): CKTOTAL, CKMB, CKMBINDEX, TROPONINI in the last 168 hours. BNP: Invalid input(s): POCBNP CBG: Recent Labs  Lab 06/26/20 1128 06/26/20 1724 06/26/20 2048 06/27/20 0801 06/27/20 1158  GLUCAP 105* 100* 93 89 100*   D-Dimer No results for input(s): DDIMER in the last 72 hours. Hgb A1c No results for input(s): HGBA1C in the last 72 hours. Lipid Profile No results for input(s): CHOL, HDL, LDLCALC, TRIG, CHOLHDL, LDLDIRECT in the last 72 hours. Thyroid function studies No results for input(s): TSH, T4TOTAL, T3FREE, THYROIDAB in the last 72 hours.  Invalid input(s): FREET3 Anemia work up [employer]    06/24/20 1548  RETICCTPCT 1.6   Urinalysis No results found for: COLORURINE, [place], [place], [place], [place], [place], [place], [place], PROTEINUR, UROBILINOGEN, NITRITE, LEUKOCYTESUR Sepsis Labs Invalid input(s):  PROCALCITONIN,  WBC,  LACTICIDVEN Microbiology Recent Results (from the past 240 hour(s))  SARS Coronavirus 2 by RT PCR (hospital order, performed in [facility] hospital lab) Nasopharyngeal Nasopharyngeal Swab     Status: None   Collection Time: 06/21/20  8:18 AM   Specimen: Nasopharyngeal Swab  Result Value Ref Range Status   SARS Coronavirus 2 NEGATIVE NEGATIVE Final    Comment: (NOTE) SARS-CoV-2 target nucleic acids are NOT DETECTED.  The SARS-CoV-2 RNA is generally detectable in upper and lower respiratory specimens during the acute phase of infection. The lowest concentration of SARS-CoV-2 viral copies this assay can detect is 250 copies / mL. A negative result does not preclude SARS-CoV-2 infection and should not be used as the sole basis for treatment or other patient management decisions.  A negative result may occur with improper specimen collection / handling, submission of specimen other than nasopharyngeal swab, presence of viral mutation(s) within the areas targeted by this assay, and inadequate number of viral copies (<250 copies / mL). A negative result must be combined with clinical observations, patient history, and epidemiological information.  Fact Sheet for Patients:   StrictlyIdeas.no  Fact Sheet for Healthcare Providers: BankingDealers.co.za  This test is not yet approved or  cleared by the [place] FDA and has been authorized for detection and/or diagnosis of SARS-CoV-2 by FDA under an Emergency Use Authorization (EUA).  This EUA will remain in effect (meaning this test can be used) for the duration of the COVID-19 declaration under Section 564(b)(1) of the Act, 21 U.S.C. section 360bbb-3(b)(1), unless the authorization is terminated or revoked sooner.  Performed at [facility], [address], [place], [place] [address]      Time coordinating discharge: Over 30  minutes  SIGNED:   Shawna Clamp, MD  [facility] 06/27/2020, 12:08 PM Pager   If 7PM-7AM, please contact night-coverage [email or web address]

## 2020-06-27 NOTE — Progress Notes (Signed)
SATURATION QUALIFICATIONS: (This note is used to comply with regulatory documentation for home oxygen)  Patient Saturations on Room Air at Rest = 93  Patient Saturations on Room Air while Ambulating = 87  Patient Saturations on 2 Liters of oxygen while Ambulating = 99

## 2020-06-27 NOTE — Telephone Encounter (Signed)
C- Schedule follow up- Mid-next week- MD; cbc/hold tube/ bmp; possible Retacrit/1 unit PRBC.  GB

## 2020-06-27 NOTE — Progress Notes (Signed)
Patrick [name]   DOB:1946/09/14   VO#:[id number]    Subjective: Shortness of breath is improved.  However still short of breath on exertion. Mild cough- over all better.   Objective:  Vitals:   06/27/20 0801 06/27/20 1157  BP: (!) 122/53 (!) 125/47  Pulse: (!) 57 (!) 58  Resp: 18 18  Temp: 98.7 F (37.1 C) 98.4 F (36.9 C)  SpO2: 98% 97%     Intake/Output Summary (Last 24 hours) at 06/27/2020 1648 Last data filed at 06/27/2020 0950 Gross per 24 hour  Intake 240 ml  Output 2700 ml  Net -2460 ml    Physical Exam Constitutional:      Comments: Elderly male patient resting in the bed.  Comfortable.  On 2 L of nasal cannula.  HENT:     Head: Normocephalic and atraumatic.     Mouth/Throat:     Pharynx: No oropharyngeal exudate.  Eyes:     Pupils: Pupils are equal, round, and reactive to light.  Cardiovascular:     Rate and Rhythm: Normal rate and regular rhythm.  Pulmonary:     Effort: No respiratory distress.     Breath sounds: No wheezing.     Comments: Decreased breath sounds bilaterally.  No wheeze. Abdominal:     General: Bowel sounds are normal. There is no distension.     Palpations: Abdomen is soft. There is no mass.     Tenderness: There is no abdominal tenderness. There is no guarding or rebound.  Musculoskeletal:        General: No tenderness. Normal range of motion.     Cervical back: Normal range of motion and neck supple.  Skin:    General: Skin is warm.  Neurological:     Mental Status: He is alert and oriented to person, place, and time.  Psychiatric:        Mood and Affect: Affect normal.      Labs:  Lab Results  Component Value Date   WBC 2.1 (L) 06/27/2020   HGB 7.8 (L) 06/27/2020   HCT 24.3 (L) 06/27/2020   MCV 90.0 06/27/2020   PLT 52 (L) 06/27/2020   NEUTROABS 0.7 (L) 06/26/2020    Lab Results  Component Value Date   NA 136 06/27/2020   K 4.2 06/27/2020   CL 98 06/27/2020   CO2 28 06/27/2020    Studies:  No results  found.  Thrombocytopenia [facility]) #74 year old male patient with multiple medical problems-with iron deficient anemia; cirrhosis hypersplenism/thrombocytopenia is currently admitted hospital for acute on chronic congestive heart failure noted to have worsening thrombocytopenia  #Thrombocytopenia-Baseline 70s; secondary cirrhosis/alcohol.  Most recently platelets are in the 40s-50-secondary to acute decompensation of congestive heart failure.  Overall stable.  #Severe symptomatic anemia-multifactorial secondary to cirrhosis liver disease/Iron deficiency anemia/folate deficiency likely from history of GI bleed/AVMs/CKD stage III-hemoglobin hemoglobin 7-8.  We will plan EPO agent as out patient also.   #Leukopenia white count 2.0-differential unavailable.  Again suspect secondary cirrhosis.  Chronic intermittent.  STABLE.   #Acute on chronic CHF-on Lasix/diuresis.  -6 L.  Breathing improved.  Defer to cardiology.   Cammie Sickle, MD 06/27/2020  4:48 PM

## 2020-06-28 SURGERY — CARDIOVERSION
Anesthesia: General

## 2020-06-28 NOTE — Telephone Encounter (Signed)
Lab orders placed.  

## 2020-06-28 NOTE — Addendum Note (Signed)
Addended by: Delice Bison E on: 06/28/2020 08:23 AM   Modules accepted: Orders

## 2020-06-28 NOTE — Telephone Encounter (Signed)
C- Please schedule apts 

## 2020-06-29 NOTE — Progress Notes (Signed)
Patient ID: Patrick [name], male    DOB: Dec 16, 1945, 74 y.o.   MRN: [id number]  HPI  Mr Brislin is a 74 y/o male with a history of DM, hyperlipidemia, HTN, anemia, MI, pulmonary HTN, paroxysmal atrial fibrillation, GERD, previous tobacco use and chronic heart failure.   Echo report from 05/06/20 reviewed and showed an EF of 40-45% along with LVH, moderately elevated PA pressure, severe LAE and mild MR/ AR.   Admitted 06/20/20 due to shortness of breath. Cardiology, oncology and nephrology consults obtained. Initially needed IV lasix with transition to oral diuretics with negative 6L obtained. Atenolol stopped due to bradycardia. Lasix decreased due to rising creatinine. Discharged after 7 days.   He presents today for his initial visit with a chief complaint of moderate shortness of breath upon moderate exertion. He describes this as chronic in nature having been present for several years although he feels like it's much improved since he now wears oxygen. He has associated fatigue, cough, pedal edema (improving), knee pain and easy bruising along with this. He denies any difficulty sleeping, dizziness, abdominal distention, palpitations, chest pain, wheezing or weight gain.   Does have scales but hasn't been weighing himself daily. Elevates his legs "sometimes" and hasn't worn compression socks. Hasn't been adding any salt to his food since recent hospitalization. Says that he can't afford the anemia injection as his insurance won't cover it.   Past Medical History:  Diagnosis Date  . ASCVD (arteriosclerotic cardiovascular disease)   . CHF (congestive heart failure) ([place])   . Chronic pulmonary hypertension ([place])   . Diabetes mellitus without complication ([place])   . GERD (gastroesophageal reflux disease)   . Heart attack ([place]) 1989  . Heart disease   . Hyperlipidemia   . Hypertension   . IDA (iron deficiency anemia)   . Moderate mitral insufficiency   . Moderate tricuspid  insufficiency   . OA (osteoarthritis)   . Paroxysmal A-fib ([place])   . Thrombocytosis ([place])   . Tremor    Past Surgical History:  Procedure Laterality Date  . [place]   with stainless steel stent  . CATARACT EXTRACTION    . Closed Reduction Vertebral Process Fracture  1966  . COLONOSCOPY  10/13/2014  . ESOPHAGOGASTRODUODENOSCOPY (EGD) WITH PROPOFOL N/A 05/08/2020   Procedure: ESOPHAGOGASTRODUODENOSCOPY (EGD) WITH PROPOFOL;  Surgeon: [name], MD;  Location: [facility] ENDOSCOPY;  Service: Gastroenterology;  Laterality: N/A;  . HEMORRHOIDECTOMY WITH HEMORRHOID BANDING    . HERNIA REPAIR     x 2  . KNEE SURGERY     bilateral knee surgery   Family History  Problem Relation Age of Onset  . Heart attack Father    Social History   Tobacco Use  . Smoking status: Former Smoker    Packs/day: 0.50    Years: 40.00    Pack years: 20.00    Types: Cigarettes    Quit date: 04/18/1997    Years since quitting: 23.2  . Smokeless tobacco: Never Used  Substance Use Topics  . Alcohol use: Not Currently    Alcohol/week: 50.0 standard drinks    Types: 50 Shots of liquor per week    Comment: "I mix a couple cups whisky/day"   Allergies  Allergen Reactions  . Aspirin Other (See Comments)    GI Bleed  . Dextrose Other (See Comments)    PATIENT CAN NOT TAKE BLOOD THINNERS OF ANY TYPE    Prior to Admission medications   Medication Sig Start Date  End Date Taking? Authorizing Provider  acetaminophen (TYLENOL) 500 MG tablet Take 500 mg by mouth every 8 (eight) hours as needed.   Yes [provider]  cyanocobalamin 1000 MCG tablet Take 1,000 mcg by mouth daily at 6 (six) AM.   Yes [provider]  docusate sodium (COLACE) 100 MG capsule Take 1 capsule (100 mg total) by mouth 2 (two) times daily. 05/11/20  Yes Hosie Poisson, MD  folic acid (FOLVITE) 1 MG tablet TAKE 1 TABLET BY MOUTH ONCE DAILY Patient taking differently: Take 1 mg by mouth daily.  11/09/19  Yes  Cammie Sickle, MD  furosemide (LASIX) 40 MG tablet Take 1 tablet (40 mg total) by mouth 2 (two) times daily. 05/11/20  Yes Hosie Poisson, MD  JANUMET 50-1000 MG tablet Take 1 tablet by mouth at bedtime. 05/30/20  Yes [provider]  magnesium oxide (MAG-OX) 400 MG tablet Take 400 mg by mouth daily. 05/11/20  Yes [provider]  melatonin 5 MG TABS Take 0.5 tablets (2.5 mg total) by mouth at bedtime. 05/11/20  Yes Hosie Poisson, MD  Multiple Vitamin (MULTIVITAMIN WITH MINERALS) TABS tablet Take 1 tablet by mouth daily. 06/12/18  Yes Gouru, Illene Silver, MD  pantoprazole (PROTONIX) 40 MG tablet Take 1 tablet (40 mg total) by mouth daily. 05/11/20  Yes Hosie Poisson, MD  potassium chloride SA (K-DUR,KLOR-CON) 20 MEQ tablet Take 1 tablet (20 mEq total) by mouth daily. 06/11/18  Yes Gouru, Aruna, MD  senna-docusate (SENOKOT-S) 8.6-50 MG tablet Take 1 tablet by mouth at bedtime as needed for mild constipation. 05/11/20  Yes Hosie Poisson, MD  simvastatin (ZOCOR) 40 MG tablet Take 40 mg by mouth daily.   Yes [provider]  spironolactone (ALDACTONE) 50 MG tablet Take 50 mg by mouth daily. 05/30/20  Yes [provider]  SYMBICORT 160-4.5 MCG/ACT inhaler Inhale 2 puffs into the lungs 2 (two) times daily. 04/05/20  Yes [provider]  thiamine 100 MG tablet Take 1 tablet (100 mg total) by mouth daily. 06/12/18  Yes Gouru, Illene Silver, MD  epoetin alfa (EPOGEN) 4000 UNIT/ML injection Inject 0.75 mLs (3,000 Units total) into the skin every 14 (fourteen) days. Patient not taking: Reported on 06/30/2020 07/09/20   Shawna Clamp, MD   Review of Systems  Constitutional: Positive for fatigue. Negative for appetite change.  HENT: Negative for congestion, postnasal drip and sore throat.   Eyes: Negative.   Respiratory: Positive for cough (productive) and shortness of breath (improving). Negative for chest tightness and wheezing.   Cardiovascular: Positive for leg swelling (improving).  Negative for chest pain and palpitations.  Gastrointestinal: Negative for abdominal distention and abdominal pain.  Endocrine: Negative.   Genitourinary: Negative.   Musculoskeletal: Positive for arthralgias (knee pain). Negative for back pain and neck pain.  Skin: Negative.   Allergic/Immunologic: Negative.   Neurological: Negative for dizziness and light-headedness.  Hematological: Negative for adenopathy. Bruises/bleeds easily.  Psychiatric/Behavioral: Negative for dysphoric mood and sleep disturbance (sleeping on 2 pillows; wearing oxygen @ 2L). The patient is not nervous/anxious.    Vitals:   06/30/20 0954  BP: (!) 120/99  Pulse: 82  Resp: 18  SpO2: 99%  Weight: 223 lb (101.2 kg)  Height: 5\' 9"  (1.753 m)   Wt Readings from Last 3 Encounters:  06/30/20 223 lb (101.2 kg)  06/27/20 227 lb (103 kg)  05/11/20 219 lb 5.7 oz (99.5 kg)   Lab Results  Component Value Date   CREATININE 1.41 (H) 06/27/2020   CREATININE 1.62 (  H) 06/26/2020   CREATININE 1.75 (H) 06/25/2020    Physical Exam Vitals and nursing note reviewed.  Constitutional:      Appearance: Normal appearance.  HENT:     Head: Normocephalic and atraumatic.  Cardiovascular:     Rate and Rhythm: Normal rate. Rhythm irregular.  Pulmonary:     Effort: Pulmonary effort is normal. No respiratory distress.     Breath sounds: No wheezing or rales.  Abdominal:     General: There is no distension.     Palpations: Abdomen is soft.     Tenderness: There is no abdominal tenderness.  Musculoskeletal:        General: No tenderness.     Cervical back: Normal range of motion and neck supple.     Right lower leg: Edema (1+ pitting) present.     Left lower leg: Edema (1+ pitting) present.  Skin:    General: Skin is warm and dry.  Neurological:     General: No focal deficit present.     Mental Status: He is alert and oriented to person, place, and time.  Psychiatric:        Mood and Affect: Mood normal.        Behavior:  Behavior normal.        Thought Content: Thought content normal.    Assessment & Plan:  1: Chronic heart failure with reduced ejection fraction- - NYHA class III - euvolemic today - has scales but doesn't weigh daily; instructed to weigh daily and call for an overnight weight gain of >2 pounds or a weekly weight gain of > 5 pounds - hasn't been adding salt to his food since his recent admission; daughter has recently bought [employer] [place] for him; written dietary information was given to him - BNP 06/19/20 was 767.2 - does not want covid vaccine - wearing oxygen at 2L around the clock - encouraged to elevate legs when sitting for long periods of time as well as wear compression socks during the day with removal at bedtime  2: HTN- - BP mildly elevated today - saw PCP ([facility]) 04/05/20 and returns later today - BMP 06/27/20 reviewed and showed sodium 136, potassium 4.2, creatinine 1.41 and GFR 49  3: Atrial fibrillation- - saw cardiology Nehemiah Massed) 03/29/20   Patient did not bring his medications nor a list. Each medication was verbally reviewed with the patient  Patient prefers to not make another appointment at this time as he's seeing "too many people". Advised patient that he could call back at anytime to schedule an appointment and he was comfortable with that plan.

## 2020-06-30 ENCOUNTER — Other Ambulatory Visit: Payer: Self-pay

## 2020-06-30 ENCOUNTER — Encounter: Payer: Self-pay | Admitting: Family

## 2020-06-30 ENCOUNTER — Ambulatory Visit: Payer: Medicare Other | Attending: Family | Admitting: Family

## 2020-06-30 VITALS — BP 120/99 | HR 82 | Resp 18 | Ht 69.0 in | Wt 223.0 lb

## 2020-06-30 DIAGNOSIS — I272 Pulmonary hypertension, unspecified: Secondary | ICD-10-CM | POA: Diagnosis not present

## 2020-06-30 DIAGNOSIS — I2510 Atherosclerotic heart disease of native coronary artery without angina pectoris: Secondary | ICD-10-CM | POA: Diagnosis not present

## 2020-06-30 DIAGNOSIS — Z79899 Other long term (current) drug therapy: Secondary | ICD-10-CM | POA: Diagnosis not present

## 2020-06-30 DIAGNOSIS — M1990 Unspecified osteoarthritis, unspecified site: Secondary | ICD-10-CM | POA: Diagnosis not present

## 2020-06-30 DIAGNOSIS — I110 Hypertensive heart disease with heart failure: Secondary | ICD-10-CM | POA: Insufficient documentation

## 2020-06-30 DIAGNOSIS — I480 Paroxysmal atrial fibrillation: Secondary | ICD-10-CM | POA: Insufficient documentation

## 2020-06-30 DIAGNOSIS — Z7951 Long term (current) use of inhaled steroids: Secondary | ICD-10-CM | POA: Insufficient documentation

## 2020-06-30 DIAGNOSIS — K219 Gastro-esophageal reflux disease without esophagitis: Secondary | ICD-10-CM | POA: Insufficient documentation

## 2020-06-30 DIAGNOSIS — Z7984 Long term (current) use of oral hypoglycemic drugs: Secondary | ICD-10-CM | POA: Diagnosis not present

## 2020-06-30 DIAGNOSIS — E119 Type 2 diabetes mellitus without complications: Secondary | ICD-10-CM | POA: Diagnosis not present

## 2020-06-30 DIAGNOSIS — Z886 Allergy status to analgesic agent status: Secondary | ICD-10-CM | POA: Diagnosis not present

## 2020-06-30 DIAGNOSIS — Z87891 Personal history of nicotine dependence: Secondary | ICD-10-CM | POA: Insufficient documentation

## 2020-06-30 DIAGNOSIS — Z8249 Family history of ischemic heart disease and other diseases of the circulatory system: Secondary | ICD-10-CM | POA: Diagnosis not present

## 2020-06-30 DIAGNOSIS — I252 Old myocardial infarction: Secondary | ICD-10-CM | POA: Diagnosis not present

## 2020-06-30 DIAGNOSIS — I10 Essential (primary) hypertension: Secondary | ICD-10-CM

## 2020-06-30 DIAGNOSIS — I5022 Chronic systolic (congestive) heart failure: Secondary | ICD-10-CM | POA: Diagnosis not present

## 2020-06-30 DIAGNOSIS — E785 Hyperlipidemia, unspecified: Secondary | ICD-10-CM | POA: Diagnosis not present

## 2020-06-30 DIAGNOSIS — I482 Chronic atrial fibrillation, unspecified: Secondary | ICD-10-CM

## 2020-06-30 NOTE — Patient Instructions (Addendum)
Begin weighing daily and call for an overnight weight gain of > 2 pounds or a weekly weight gain of >5 pounds.   Call us in the future if you'd like to schedule another appointment.  

## 2020-07-04 ENCOUNTER — Telehealth: Payer: Self-pay | Admitting: Internal Medicine

## 2020-07-04 NOTE — Telephone Encounter (Signed)
Pt called to cancel his 9/8 appt. He stated his wife is very sick and he can't come because she drives him. He would like a call back to reschedule at a later date.

## 2020-07-05 ENCOUNTER — Inpatient Hospital Stay: Payer: Medicare Other

## 2020-07-05 ENCOUNTER — Inpatient Hospital Stay: Payer: Medicare Other | Admitting: Internal Medicine

## 2020-07-05 NOTE — Telephone Encounter (Signed)
Apts cnl per pt's request

## 2020-10-10 ENCOUNTER — Other Ambulatory Visit: Payer: Self-pay | Admitting: Internal Medicine

## 2020-10-28 DEATH — deceased

## 2021-03-29 IMAGING — CR DG CHEST 2V
1 series · 2 of 2 positions shown · non-contrast
Comparison: 05/05/2020

CLINICAL DATA: Shortness of breath.  Leg swelling.

EXAM:
CHEST - 2 VIEW

[Series 1: dg chest 2 view · 0.14mm/px · 2 of 2 slices shown]
[im 1/2]
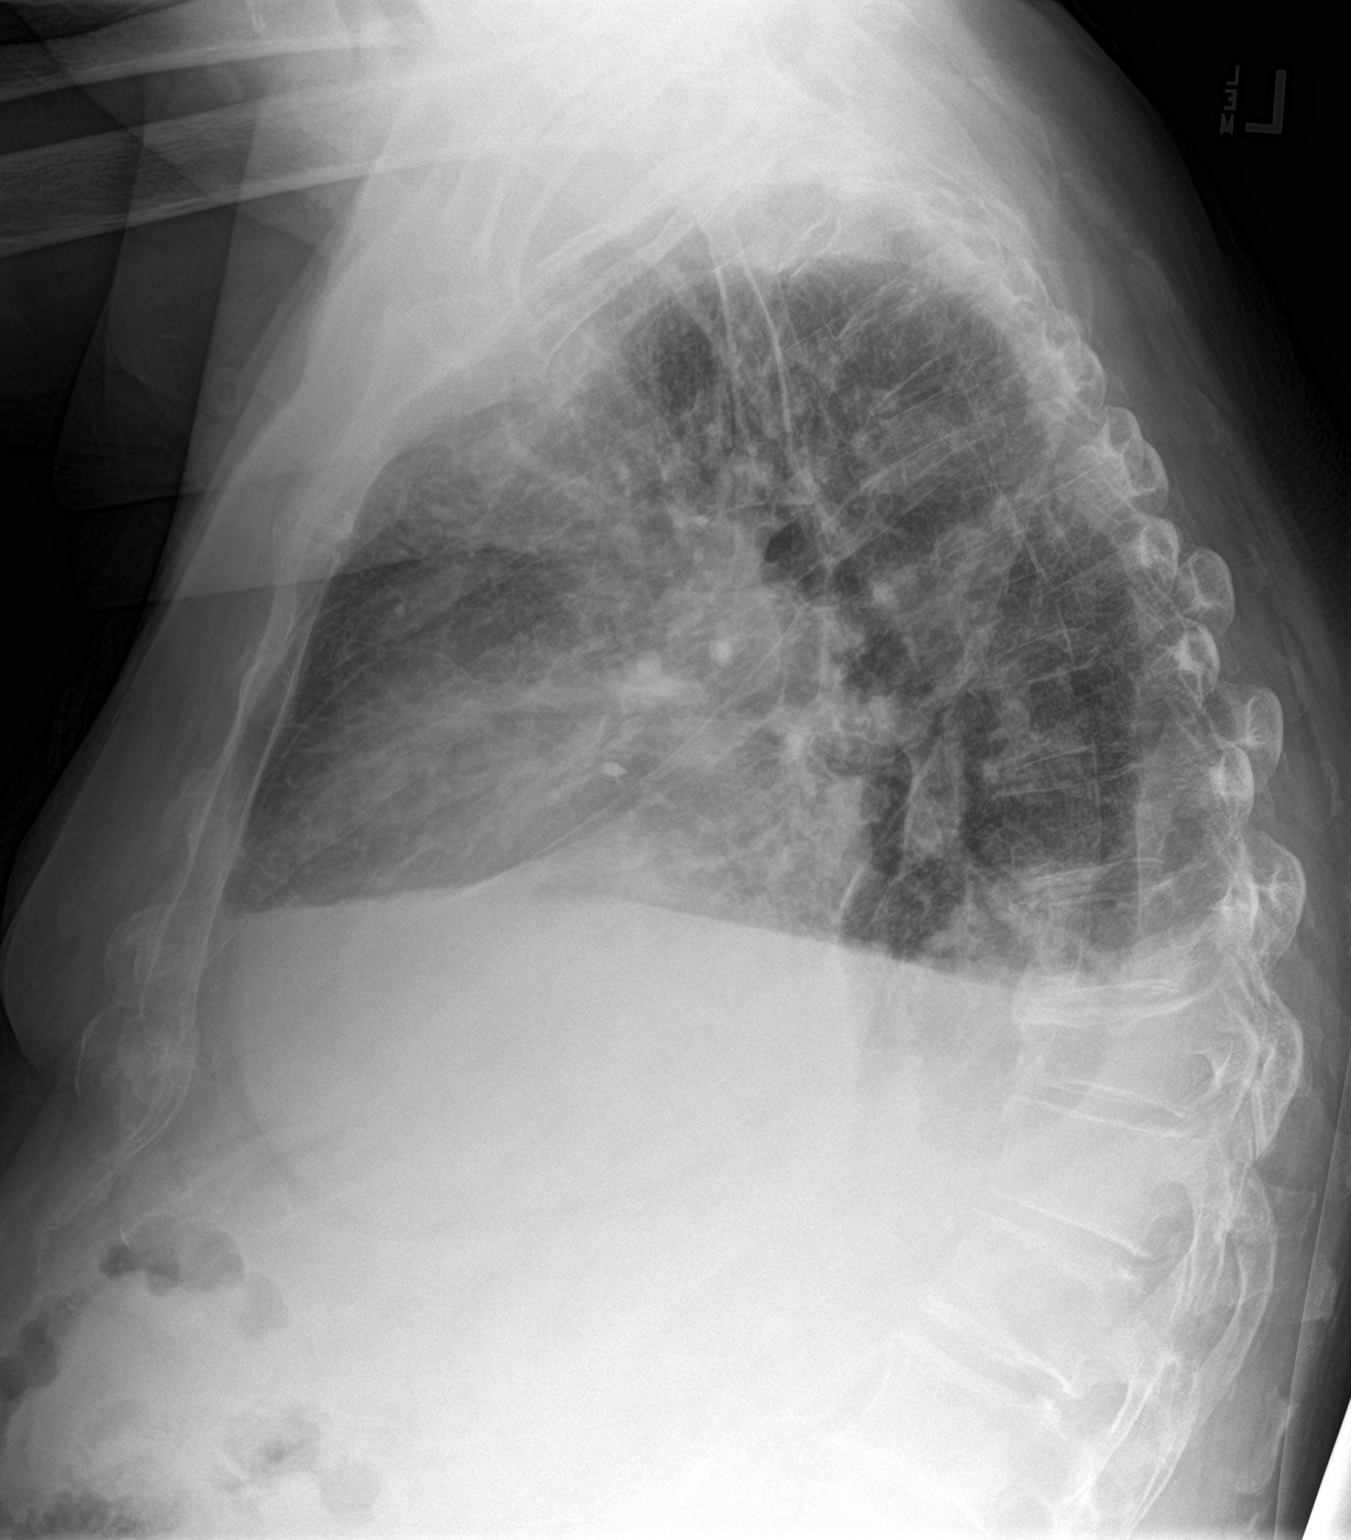
[im 2/2]
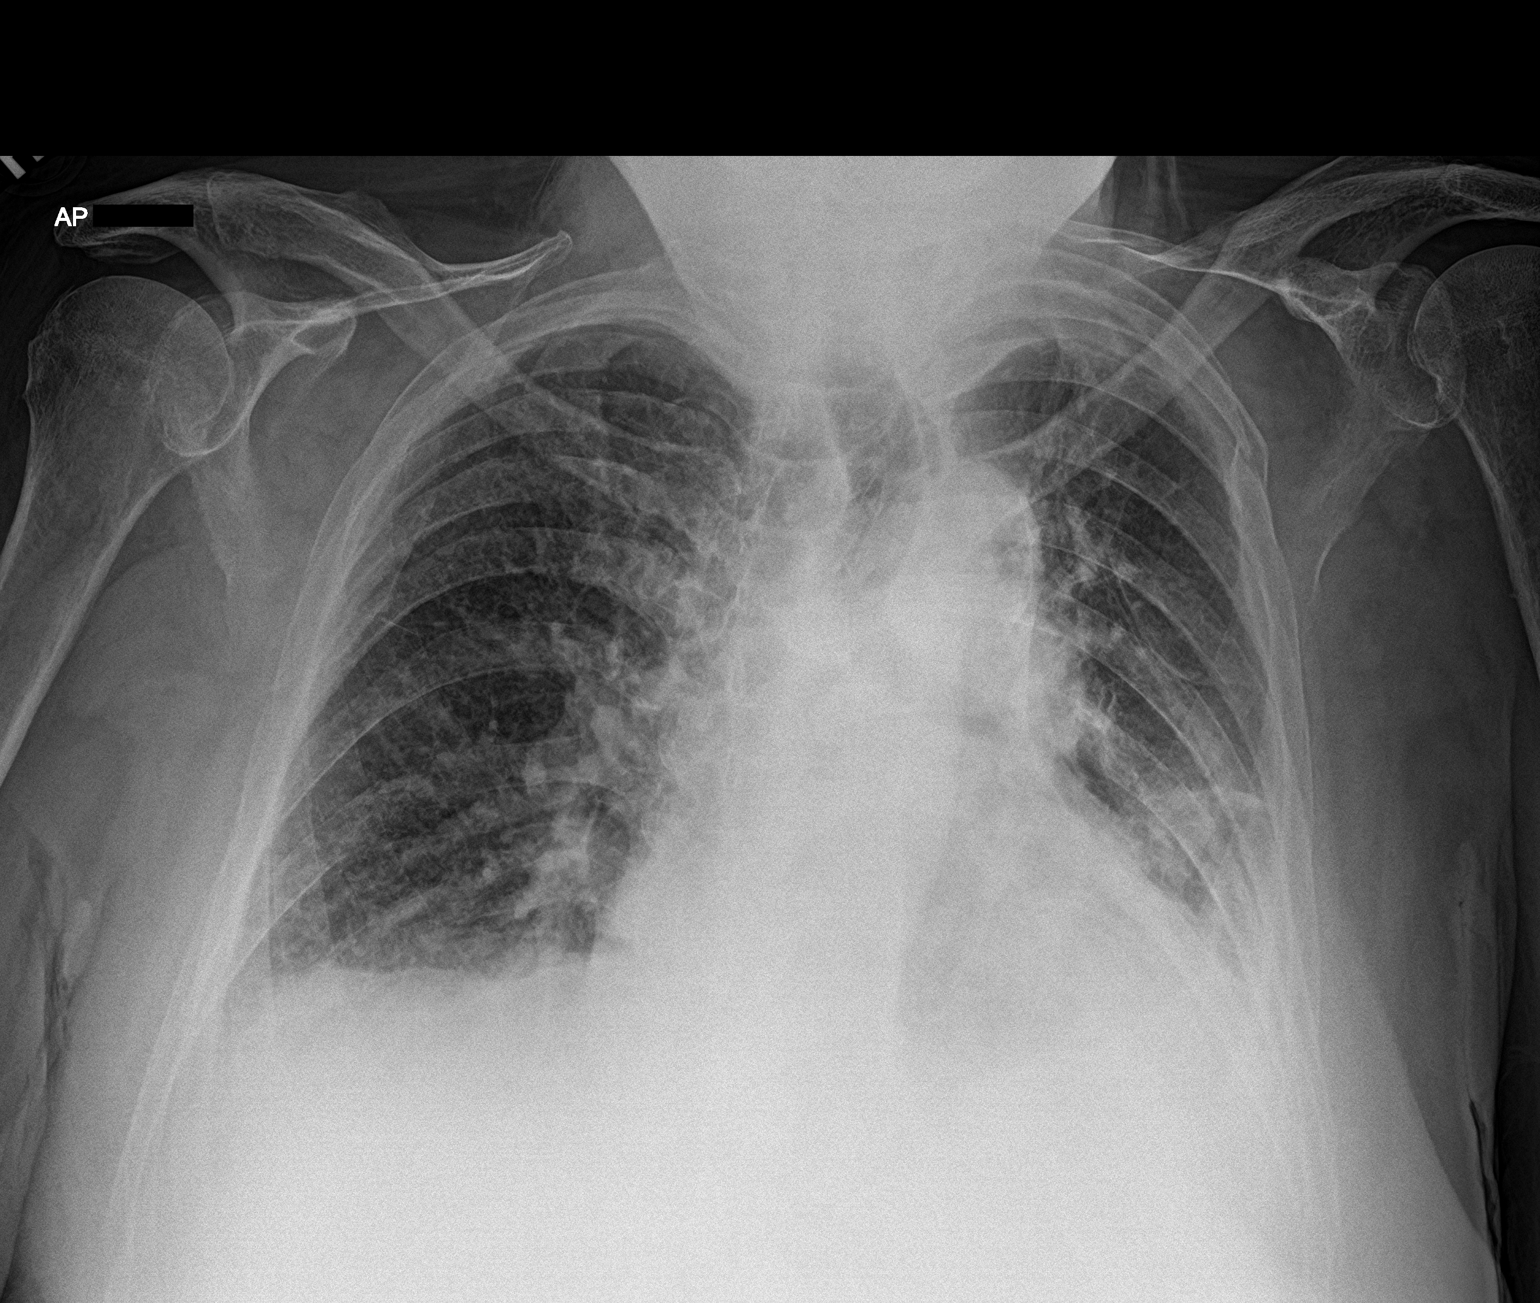

[2 of 2 positions shown; findings below may reference images not displayed]

FINDINGS: Mild wedging of a lower thoracic vertebral body is similar to the
prior CT. Reverse apical lordotic positioning on the frontal
radiograph. Remote upper left rib fractures. Patient rotated left.
Mild cardiomegaly. Trace bilateral pleural fluid. No pneumothorax.
Pulmonary venous congestion, as evidenced by pulmonary interstitial
prominence and indistinctness. Similar left base pulmonary opacities
and volume loss, favoring atelectasis and/or scar.
IMPRESSION: Cardiomegaly with pulmonary venous congestion and trace bilateral
pleural effusions.

## 2021-04-03 IMAGING — US US RENAL
1 series · 14 of 25 positions shown · non-contrast
Comparison: None.

CLINICAL DATA: ATN

EXAM:
RENAL / URINARY TRACT ULTRASOUND COMPLETE

[Series 1: us renal · 14 of 32 slices shown]
[im 1/32]
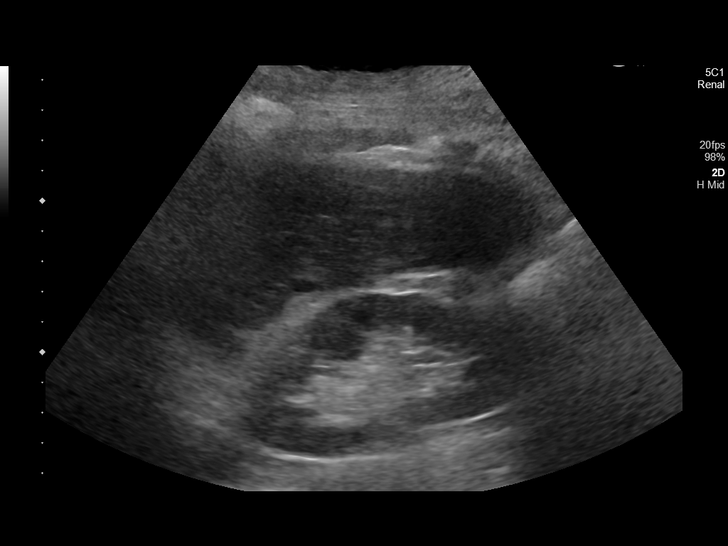
[im 3/32]
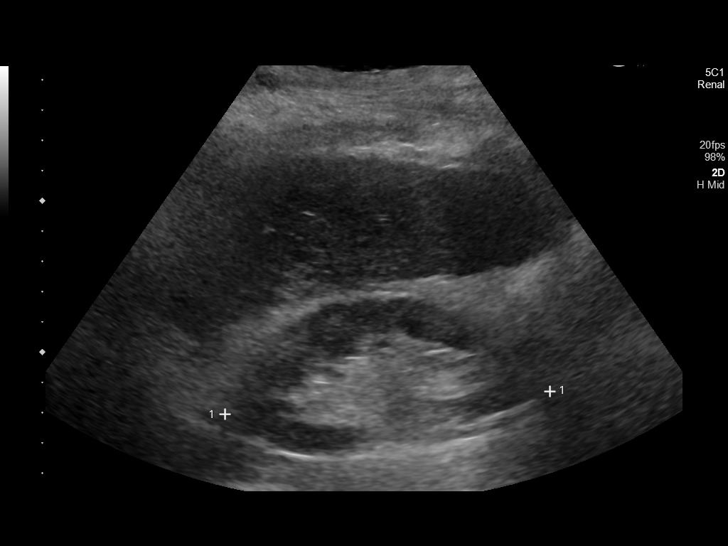
[im 6/32]
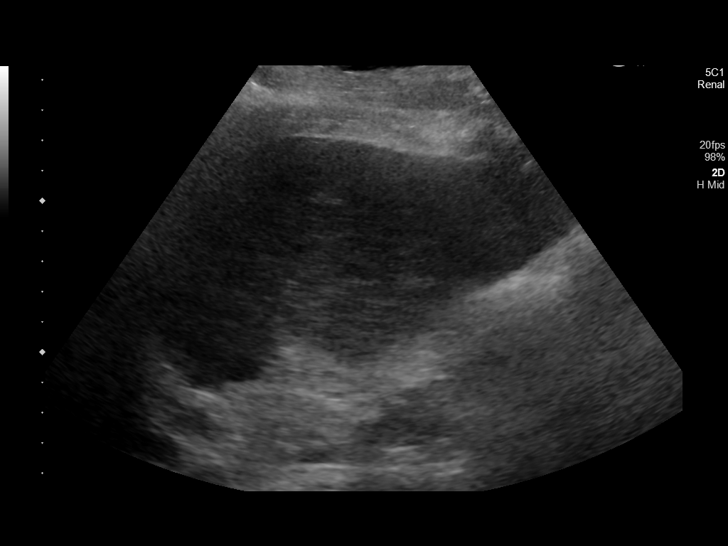
[im 8/32]
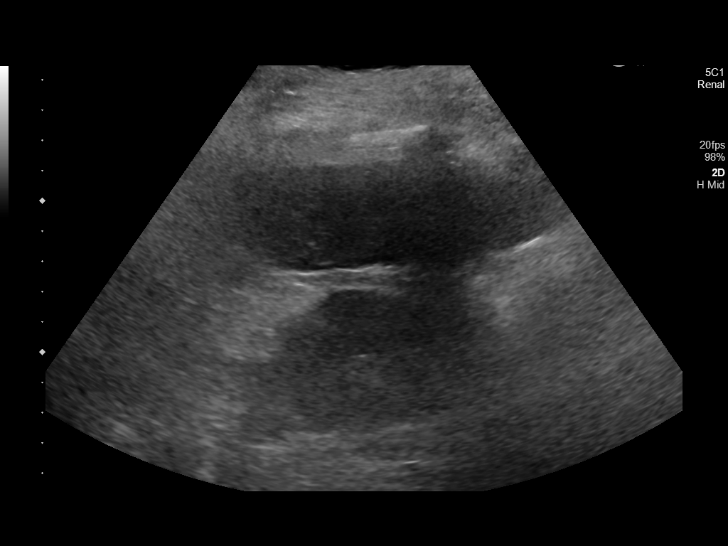
[im 11/32]
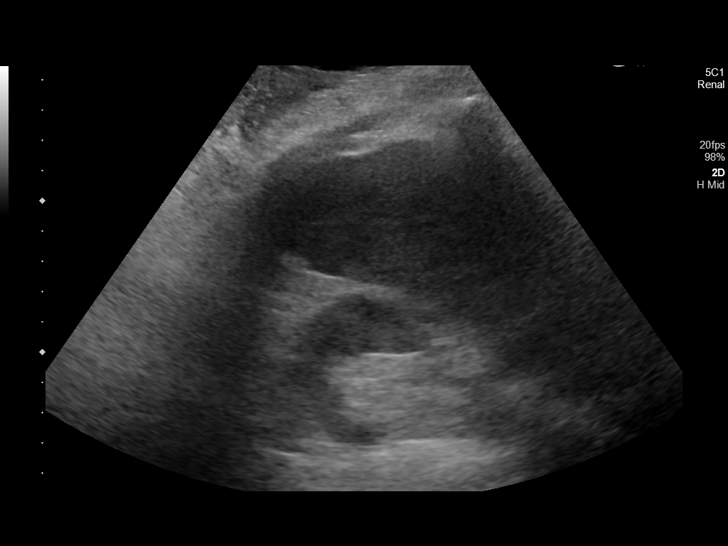
[im 12/32]
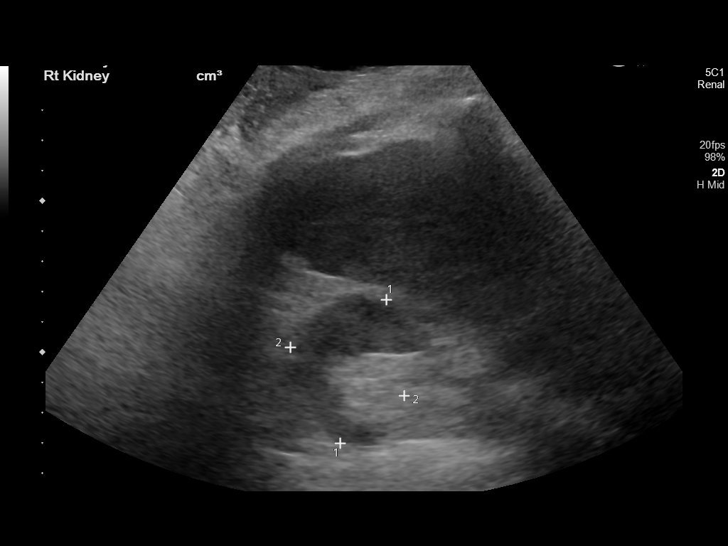
[im 15/32]
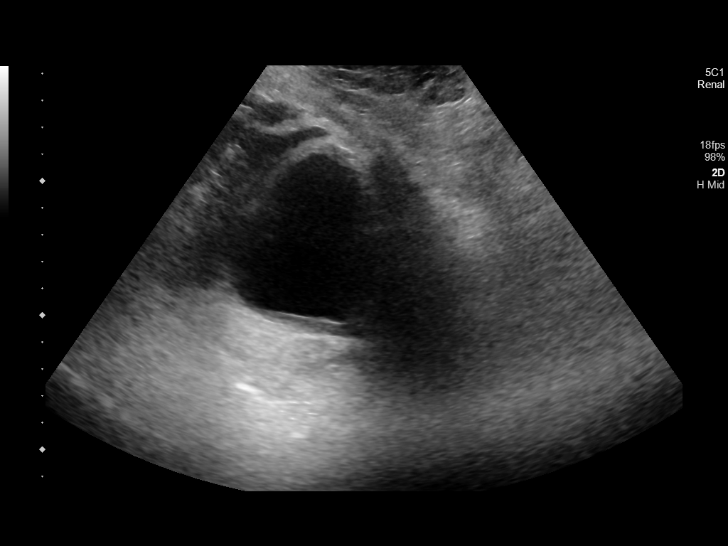
[im 17/32]
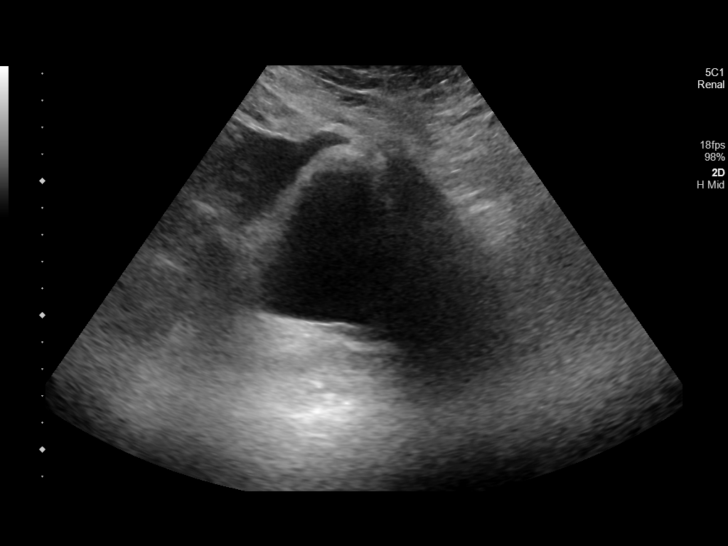
[im 20/32]
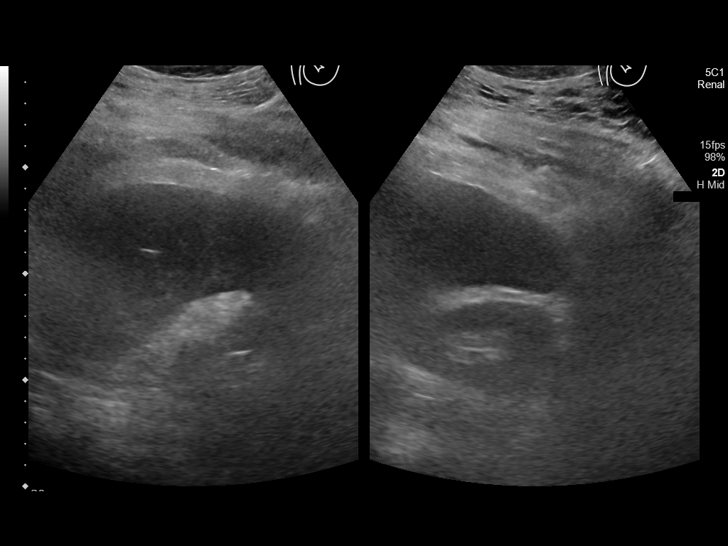
[im 21/32]
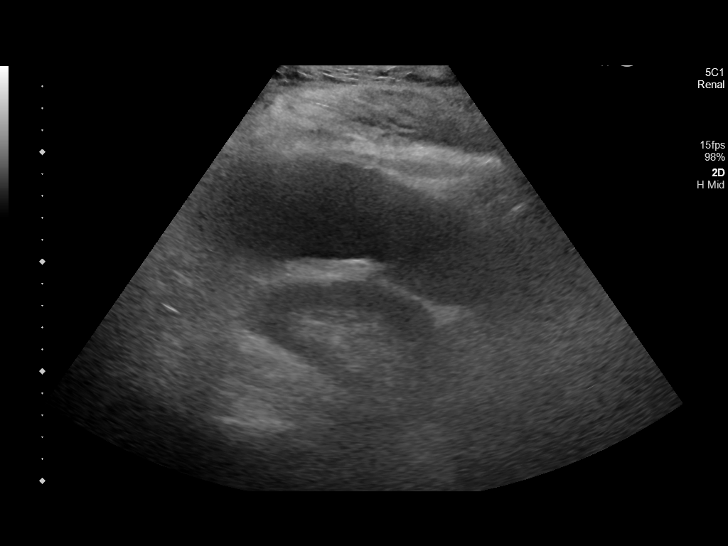
[im 24/32]
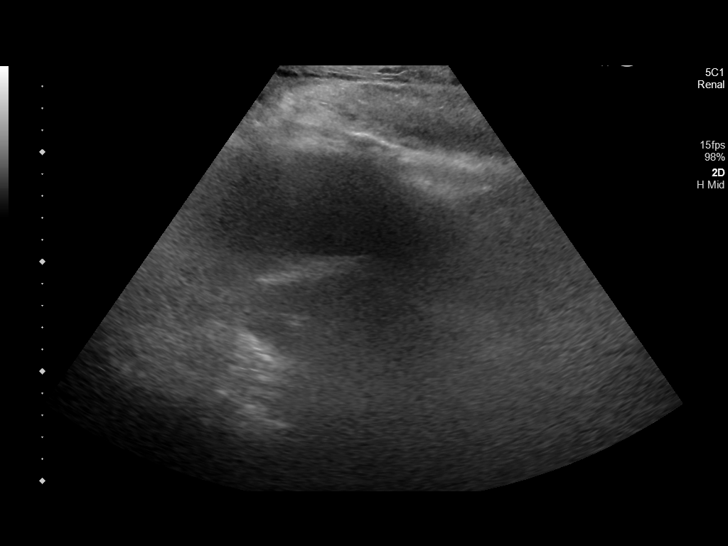
[im 26/32]
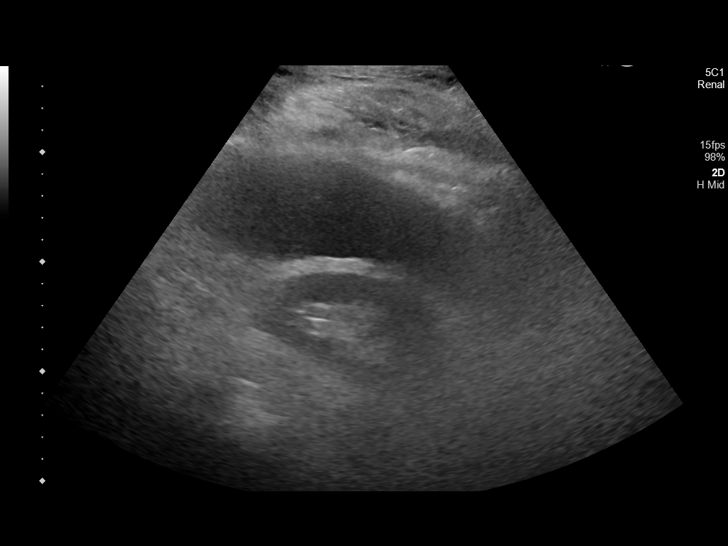
[im 29/32]
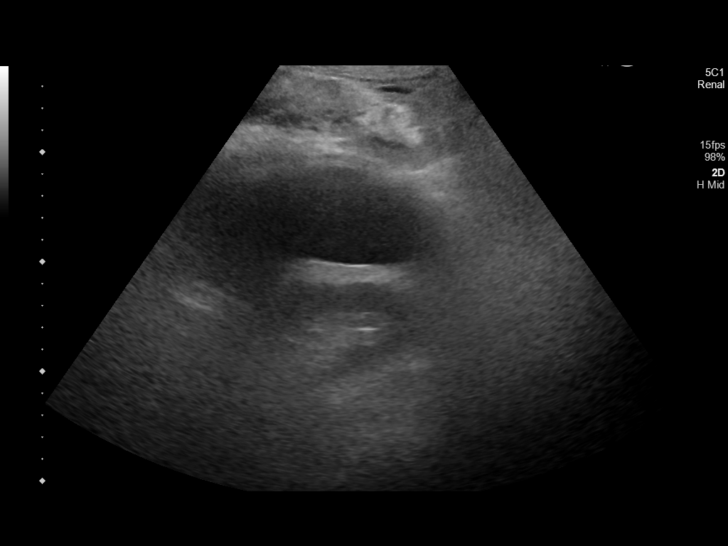
[im 32/32]
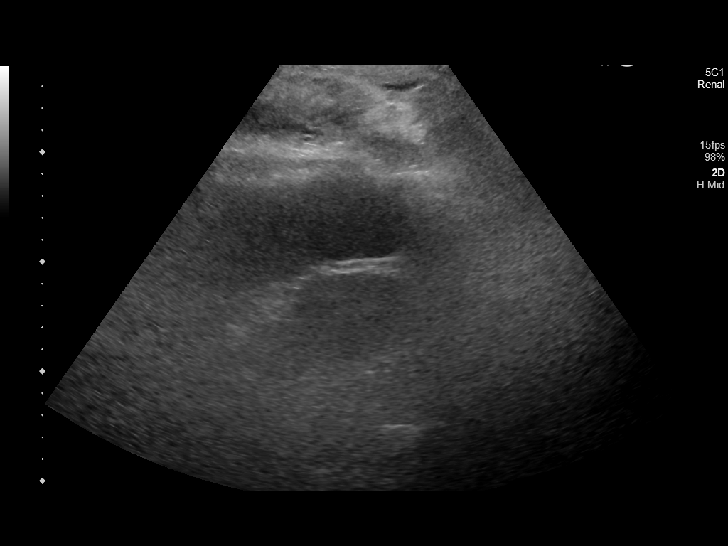

[14 of 25 positions shown; findings below may reference images not displayed]

FINDINGS: Right Kidney:

Renal measurements: 10.7 x 5.0 x 4.1 cm = volume: 115 mL.
Echogenicity is increased. No mass or hydronephrosis visualized.

Left Kidney:

Renal measurements: 10.1 x 4.8 x 5.4 cm = volume: 137 mL.
Echogenicity is increased. No mass or hydronephrosis visualized.

Bladder:

Appears normal for degree of bladder distention.

Other:

None.
IMPRESSION: Increased echogenicity of the bilateral kidneys as can be seen in
medical renal disease. No hydronephrosis.
